# Patient Record
Sex: Female | Born: 1985 | Race: White | Hispanic: No | Marital: Married | Smoking: Never smoker
Health system: Southern US, Community
[De-identification: ages and names within clinical notes are randomized; demographics above are authoritative.]

## PROBLEM LIST (undated history)

## (undated) DIAGNOSIS — K219 Gastro-esophageal reflux disease without esophagitis: Secondary | ICD-10-CM

## (undated) DIAGNOSIS — G43909 Migraine, unspecified, not intractable, without status migrainosus: Secondary | ICD-10-CM

## (undated) DIAGNOSIS — K279 Peptic ulcer, site unspecified, unspecified as acute or chronic, without hemorrhage or perforation: Secondary | ICD-10-CM

## (undated) DIAGNOSIS — T79A21A Traumatic compartment syndrome of right lower extremity, initial encounter: Secondary | ICD-10-CM

## (undated) HISTORY — PX: KNEE SURGERY: SHX244

## (undated) HISTORY — DX: Migraine, unspecified, not intractable, without status migrainosus: G43.909

## (undated) HISTORY — DX: Traumatic compartment syndrome of right lower extremity, initial encounter: T79.A21A

## (undated) HISTORY — PX: FASCIECTOMY: SHX6525

---
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
## 2007-05-19 ENCOUNTER — Encounter: Payer: Self-pay | Admitting: Family Medicine

## 2007-05-19 ENCOUNTER — Ambulatory Visit (HOSPITAL_COMMUNITY): Admission: RE | Admit: 2007-05-19 | Discharge: 2007-05-19 | Payer: Self-pay | Admitting: Family Medicine

## 2007-05-19 ENCOUNTER — Ambulatory Visit: Payer: Self-pay | Admitting: *Deleted

## 2007-07-25 ENCOUNTER — Ambulatory Visit: Payer: Self-pay | Admitting: Vascular Surgery

## 2007-07-25 ENCOUNTER — Encounter: Payer: Self-pay | Admitting: Family Medicine

## 2007-07-25 ENCOUNTER — Ambulatory Visit (HOSPITAL_COMMUNITY): Admission: RE | Admit: 2007-07-25 | Discharge: 2007-07-25 | Payer: Self-pay | Admitting: Family Medicine

## 2007-08-18 ENCOUNTER — Emergency Department (HOSPITAL_COMMUNITY): Admission: EM | Admit: 2007-08-18 | Discharge: 2007-08-18 | Payer: Self-pay | Admitting: Emergency Medicine

## 2007-10-27 ENCOUNTER — Ambulatory Visit (HOSPITAL_COMMUNITY): Admission: RE | Admit: 2007-10-27 | Discharge: 2007-10-27 | Payer: Self-pay | Admitting: Neurology

## 2010-05-26 NOTE — Procedures (Signed)
EEG NUMBER:   HISTORY:  This is a 25 year old patient with episodes of loss of  consciousness and jerking.  The patient is being evaluated for possible  seizures.  This is a routine EEG.  No skull defects were noted.   This patient is on Zyrtec, Neurontin, and birth control pills.   EEG CLASSIFICATION:  Normal awake and sleep.   DESCRIPTION OF RECORDING:  Background rhythm of this recording consists  of a fairly well-modulated medium amplitude alpha rhythm of 10 Hz that  is reactive to eye opening and closure.  As the record progresses, the  patient initially is in the waking state.  Photic stimulation is  performed resulting in a bilateral and symmetric photic driving  response.  Hyperventilation was then performed resulting in a very  minimal buildup of the background rhythm activities without significant  slowing seen.  At no time during the recording did there appear to be  evidence of actual spikes, spike wave discharges, or evidence of focal  slowing.  The patient, however, enters the sleeping state very rapidly  with vertex sharp wave activity sleep spindles seen.  The patient spends  the majority at the end of the recording in the sleeping state.  Again,  no actual spikes or spike wave discharges were not seen.   EKG monitor shows no evidence of cardiac rhythm abnormalities with a  heart rate of 96.   IMPRESSION:  This is a normal EEG recording in the awake and sleeping  state.  No evidence of ictal or interictal discharges were seen.  The  patient appears to enter the sleeping state quite rapidly.  We would  consider a possible underlying sleep disorder.  Clinical correlation is  required.      Marlan Palau, M.D.  Electronically Signed     [id number]  D:  10/27/2007 18:47:37  T:  10/28/2007 03:19:52  Job #:  [id number]

## 2010-11-09 ENCOUNTER — Emergency Department: Payer: Self-pay | Admitting: Emergency Medicine

## 2010-12-26 ENCOUNTER — Encounter: Payer: Self-pay | Admitting: *Deleted

## 2010-12-26 ENCOUNTER — Emergency Department (HOSPITAL_COMMUNITY)
Admission: EM | Admit: 2010-12-26 | Discharge: 2010-12-26 | Disposition: A | Discharge status: Home / Self Care | Payer: BC Managed Care – PPO | Attending: Emergency Medicine | Admitting: Emergency Medicine

## 2010-12-26 DIAGNOSIS — B019 Varicella without complication: Secondary | ICD-10-CM | POA: Insufficient documentation

## 2010-12-26 DIAGNOSIS — R21 Rash and other nonspecific skin eruption: Secondary | ICD-10-CM | POA: Insufficient documentation

## 2010-12-26 NOTE — ED Provider Notes (Addendum)
History     CSN: [id number] Arrival date & time: 12/26/2010  6:28 PM   First MD Initiated Contact with Patient 12/26/10 1851      Chief Complaint  Patient presents with  . Rash    (Consider location/radiation/quality/duration/timing/severity/associated sxs/prior treatment) HPI Patient with rash that she noticed yesterday on her abdomen, right chest, and back.  Patient denies fever, nausea or vomiting.  Patient denies diarrhea, headache.  States the rash is itchy.  Denies any exposure to contact dermatitis plans.  History reviewed. No pertinent past medical history.  History reviewed. No pertinent past surgical history.  No family history on file.  History  Substance Use Topics  . Smoking status: Not on file  . Smokeless tobacco: Not on file  . Alcohol Use: Not on file    OB History    Grav Para Term Preterm Abortions TAB SAB Ect Mult Living                  Review of Systems  All other systems reviewed and are negative.    Allergies  Acyclovir and related; Advil; and Sulfa antibiotics  Home Medications   Current Outpatient Rx  Name Route Sig Dispense Refill  . CETIRIZINE HCL 10 MG PO TABS Oral Take 10 mg by mouth daily.      Marland Kitchen KETOPROFEN 50 MG PO CAPS Oral Take 50 mg by mouth 4 (four) times daily as needed. For migraine headaches     . SPRINTEC 28 PO Oral Take 1 tablet by mouth daily.      . TOPIRAMATE 25 MG PO TABS Oral Take 25 mg by mouth daily.        BP 110/74  Pulse 63  Temp(Src) 97.8 F (36.6 C) (Oral)  Resp 20  SpO2 100%  Physical Exam  Nursing note and vitals reviewed. Constitutional: She is oriented to person, place, and time. She appears well-developed and well-nourished. No distress.  HENT:  Head: Normocephalic and atraumatic.  Eyes: Pupils are equal, round, and reactive to light.  Neck: Normal range of motion.  Cardiovascular: Normal rate and intact distal pulses.   Pulmonary/Chest: No respiratory distress.  Abdominal: Normal  appearance. She exhibits no distension.  Musculoskeletal: Normal range of motion.  Neurological: She is alert and oriented to person, place, and time. No cranial nerve deficit.  Skin: Skin is warm and dry. Rash noted.       Rash on stomach chest and back area consisted of small vesicles of differing ages.  This rash is consistent with chickenpox  Psychiatric: She has a normal mood and affect. Her behavior is normal.    ED Course  Procedures (including critical care time)  Labs Reviewed - No data to display No results found.  Diagnosis: #1.  Chickenpox   MDM         Nelia Shi, MD 12/26/10 1854  Nelia Shi, MD 12/26/10 [phone]

## 2010-12-26 NOTE — ED Notes (Signed)
Patient with rash that she noticed yesterday on her abdomen, right chest, and back

## 2011-07-12 HISTORY — PX: ANKLE SURGERY: SHX546

## 2011-10-01 ENCOUNTER — Ambulatory Visit: Payer: Self-pay | Admitting: General Practice

## 2012-07-11 ENCOUNTER — Emergency Department: Payer: Self-pay | Admitting: Emergency Medicine

## 2012-07-14 ENCOUNTER — Emergency Department: Payer: Self-pay | Admitting: Emergency Medicine

## 2012-07-18 ENCOUNTER — Emergency Department: Payer: Self-pay | Admitting: Unknown Physician Specialty

## 2012-07-25 ENCOUNTER — Emergency Department: Payer: Self-pay | Admitting: Emergency Medicine

## 2012-09-15 ENCOUNTER — Encounter: Payer: Self-pay | Admitting: Family Medicine

## 2012-09-15 ENCOUNTER — Ambulatory Visit (INDEPENDENT_AMBULATORY_CARE_PROVIDER_SITE_OTHER): Payer: BC Managed Care – PPO | Admitting: Family Medicine

## 2012-09-15 VITALS — BP 124/84 | HR 68 | Temp 97.4°F | Ht 65.0 in | Wt 128.0 lb

## 2012-09-15 DIAGNOSIS — T799XXS Unspecified early complication of trauma, sequela: Secondary | ICD-10-CM

## 2012-09-15 DIAGNOSIS — T798XXS Other early complications of trauma, sequela: Secondary | ICD-10-CM

## 2012-09-15 NOTE — Progress Notes (Signed)
  Subjective:    Patient ID: Tonya Lee, female    DOB: April 29, 1985, 27 y.o.   MRN: [id number]  HPI  This 27 y.o. female presents for evaluation of wound from dog bite on her right leg Over 3 weeks ago.  She was bitten by a dog on the job and she was tx'd under [employer] comp and she states she had to get sutures and the wound became infected and she wasn't tx with abx's until yesterday.  She was released from [employer] and she is here to get a second opinion. She was tx'd with augmentin abx's and she has been taking this for the last day.  Review of Systems C/o right leg wound No chest pain, SOB, HA, dizziness, vision change, N/V, diarrhea, constipation, dysuria, urinary urgency or frequency, myalgias, arthralgias or rash.     Objective:   Physical Exam  Vital signs noted  Well developed well nourished female.  HEENT - Head atraumatic Normocephalic Respiratory - Lungs CTA bilateral Cardiac - RRR S1 and S2 without murmur Skin - Right lateral calf area with healed laceration approx 8cm long With area of dehiscence in middle with wound approx 1/2 cm in diameter. The wound is cleaned and enlarged and purulence is cleaned out with a  q tip and cx tip.  Wet to Dry dressing is applied.  ACE wrap is applied and dressing secured.      Assessment & Plan:  Post-traumatic wound infection, sequela - Plan: Aerobic culture Patient is currently on Augmentin and will cx wound and see if sensitive.  Apply wet to dry Dressing for next few days to week and then apply band aide.  Follow up prn

## 2012-09-15 NOTE — Patient Instructions (Signed)
Dressing Change A dressing is a material placed over wounds. It keeps the wound clean, dry, and protected from further injury. This provides an environment that favors wound healing.  BEFORE YOU BEGIN  Get your supplies together. Things you may need include:  Saline solution.  Flexible gauze dressing.  Medicated cream.  Tape.  Gloves.  Abdominal dressing pads.  Gauze squares.  Plastic bags.  Take pain medicine 30 minutes before the dressing change if you need it.  Take a shower before you do the first dressing change of the day. Use plastic wrap or a plastic bag to prevent the dressing from getting wet. REMOVING YOUR OLD DRESSING   Wash your hands with soap and water. Dry your hands with a clean towel.  Put on your gloves.  Remove any tape.  Carefully remove the old dressing. If the dressing sticks, you may dampen it with warm water to loosen it, or follow your caregiver's specific directions.  Remove any gauze or packing tape that is in your wound.  Take off your gloves.  Put the gloves, tape, gauze, or any packing tape into a plastic bag. CHANGING YOUR DRESSING  Open the supplies.  Take the cap off the saline solution.  Open the gauze package so that the gauze remains on the inside of the package.  Put on your gloves.  Clean your wound as told by your caregiver.  If you have been told to keep your wound dry, follow those instructions.  Your caregiver may tell you to do one or more of the following:  Pick up the gauze. Pour the saline solution over the gauze. Squeeze out the extra saline solution.  Put medicated cream or other medicine on your wound if you have been told to do so.  Put the solution soaked gauze only in your wound, not on the skin around it.  Pack your wound loosely or as told by your caregiver.  Put dry gauze on your wound.  Put abdominal dressing pads over the dry gauze if your wet gauze soaks through.  Tape the abdominal dressing  pads in place so they will not fall off. Do not wrap the tape completely around the affected part (arm, leg, abdomen).  Wrap the dressing pads with a flexible gauze dressing to secure it in place.  Take off your gloves. Put them in the plastic bag with the old dressing. Tie the bag shut and throw it away.  Keep the dressing clean and dry until your next dressing change.  Wash your hands. SEEK MEDICAL CARE IF:  Your skin around the wound looks red.  Your wound feels more tender or sore.  You see pus in the wound.  Your wound smells bad.  You have a fever.  Your skin around the wound has a rash that itches and burns.  You see black or yellow skin in your wound that was not there before.  You feel nauseous, throw up, and feel very tired. Document Released: 02/05/2004 Document Revised: 03/22/2011 Document Reviewed: 11/09/2010 ExitCare Patient Information 2014 [employer]. Wound Care Wound care helps prevent pain and infection.  You may need a tetanus shot if:  You cannot remember when you had your last tetanus shot.  You have never had a tetanus shot.  The injury broke your skin. If you need a tetanus shot and you choose not to have one, you may get tetanus. Sickness from tetanus can be serious. HOME CARE   Only take medicine as told by   your doctor.  Clean the wound daily with mild soap and water.  Change any bandages (dressings) as told by your doctor.  Put medicated cream and a bandage on the wound as told by your doctor.  Change the bandage if it gets wet, dirty, or starts to smell.  Take showers. Do not take baths, swim, or do anything that puts your wound under water.  Rest and raise (elevate) the wound until the pain and puffiness (swelling) are better.  Keep all doctor visits as told. GET HELP RIGHT AWAY IF:   Yellowish-white fluid (pus) comes from the wound.  Medicine does not lessen your pain.  There is a red streak going away from the  wound.  You have a fever. MAKE SURE YOU:   Understand these instructions.  Will watch your condition.  Will get help right away if you are not doing well or get worse. Document Released: 10/07/2007 Document Revised: 03/22/2011 Document Reviewed: 05/03/2010 ExitCare Patient Information 2014 [employer].  

## 2012-09-18 LAB — AEROBIC CULTURE

## 2012-09-26 ENCOUNTER — Telehealth: Payer: Self-pay | Admitting: *Deleted

## 2012-09-26 NOTE — Telephone Encounter (Signed)
Message copied by Bearl Mulberry on Tue Sep 26, 2012  6:13 PM ------      Message from: Deatra Canter      Created: Mon Sep 18, 2012 10:06 AM       Wound cx did not grow out any pathologic organism, continue current abx's. ------

## 2012-09-26 NOTE — Telephone Encounter (Signed)
Pt notified of results

## 2012-12-15 ENCOUNTER — Ambulatory Visit (INDEPENDENT_AMBULATORY_CARE_PROVIDER_SITE_OTHER): Payer: BC Managed Care – PPO | Admitting: Physician Assistant

## 2012-12-15 ENCOUNTER — Encounter: Payer: Self-pay | Admitting: Physician Assistant

## 2012-12-15 VITALS — BP 114/78 | HR 83 | Temp 98.0°F | Wt 129.2 lb

## 2012-12-15 DIAGNOSIS — K2970 Gastritis, unspecified, without bleeding: Secondary | ICD-10-CM

## 2012-12-15 NOTE — Progress Notes (Signed)
   Subjective:    Patient ID: Tonya Lee, female    DOB: 03-Aug-1985, 27 y.o.   MRN: [id number]  HPI 27 y/o female presents with nausea, headache and abdominal pain since yesterday at 6pm. Has taken tylenol 30 min prior to appt for relief. Recent sick contact with similar symptoms.     Review of Systems  Constitutional: Positive for fever (low grade), activity change, appetite change (decreased) and fatigue. Negative for chills, diaphoresis and unexpected weight change.  Eyes: Negative.   Respiratory: Negative.   Cardiovascular: Negative.   Gastrointestinal: Positive for nausea and abdominal pain (periumbilical and RLQ). Negative for vomiting, diarrhea, constipation and anal bleeding.  Musculoskeletal: Positive for myalgias (overall body aches).  Neurological: Positive for headaches. Negative for light-headedness.       Objective:   Physical Exam  Constitutional: She is oriented to person, place, and time. She appears well-developed and well-nourished. No distress.  HENT:  Head: Atraumatic.  Cardiovascular: Normal rate.   Pulmonary/Chest: Effort normal and breath sounds normal. No respiratory distress. She has no wheezes. She has no rales. She exhibits no tenderness.  Abdominal: Soft. She exhibits no distension. There is tenderness (TTP in RLQ and periumbilical. Negative Rovsing, negative psoas sign & negative obturator). There is tenderness at McBurney's point. There is no rebound, no guarding and no CVA tenderness.  Neurological: She is alert and oriented to person, place, and time.  Skin: She is not diaphoretic.          Assessment & Plan:  1. Viral gastritis: Due to generalized symptoms and history of a contact with similar symptoms, I feel that this is a viral illness and will resolve. However, due to patients TTP at Mcburney's point, nausea and history of a fever, I advised her to go to the ER if s/s worsen or do not improve in a couple of days to r/o appendicitis. She  does not appear to be in pain of this severity at today's visit. Advised to drink plenty of fluids, bland diet.

## 2012-12-15 NOTE — Patient Instructions (Signed)
Drink plenty of fluids. Bananas, rice, applesauce, toast --- bland foods to diet in the  Beginning. If s/s worsen ( fever, severe naval or right lower stomach pain, vomiting) go to the ER for further evaluation.

## 2013-04-21 ENCOUNTER — Encounter: Payer: Self-pay | Admitting: Nurse Practitioner

## 2013-04-21 ENCOUNTER — Ambulatory Visit (INDEPENDENT_AMBULATORY_CARE_PROVIDER_SITE_OTHER): Payer: BC Managed Care – PPO | Admitting: Nurse Practitioner

## 2013-04-21 VITALS — BP 110/75 | HR 69 | Temp 97.9°F | Wt 133.2 lb

## 2013-04-21 DIAGNOSIS — B070 Plantar wart: Secondary | ICD-10-CM

## 2013-04-21 NOTE — Progress Notes (Signed)
   Subjective:    Patient ID: Tonya Lee, female    DOB: December 23, 1985, 28 y.o.   MRN: [id number]  HPI Patient in c/o lesion that has come up on right thigh- SHe tried OTC wart stuff which did not help.    Review of Systems  All other systems reviewed and are negative.      Objective:   Physical Exam  Constitutional: She appears well-developed and well-nourished.  Cardiovascular: Normal rate, regular rhythm and normal heart sounds.   Pulmonary/Chest: Effort normal and breath sounds normal.  Neurological: She is alert.  Skin:  1.5 cm flesh colored lesion right thigh    BP 110/75  Pulse 69  Temp(Src) 97.9 F (36.6 C) (Oral)  Wt 133 lb 3.2 oz (60.419 kg)       Assessment & Plan:   1. Verruca pedis    Cryotherapy-care discussed Watch for signs of infection  Mary-Margaret Daphine DeutscherMartin, FNP

## 2013-09-07 ENCOUNTER — Ambulatory Visit: Payer: Self-pay | Admitting: Family Medicine

## 2013-11-19 ENCOUNTER — Ambulatory Visit: Payer: BC Managed Care – PPO | Admitting: Family Medicine

## 2013-11-19 ENCOUNTER — Ambulatory Visit (INDEPENDENT_AMBULATORY_CARE_PROVIDER_SITE_OTHER): Payer: BC Managed Care – PPO | Admitting: Family Medicine

## 2013-11-19 ENCOUNTER — Ambulatory Visit: Payer: BC Managed Care – PPO | Admitting: Nurse Practitioner

## 2013-11-19 ENCOUNTER — Encounter: Payer: Self-pay | Admitting: Family Medicine

## 2013-11-19 VITALS — BP 121/72 | HR 76 | Temp 98.1°F | Ht 65.0 in | Wt 139.2 lb

## 2013-11-19 DIAGNOSIS — R21 Rash and other nonspecific skin eruption: Secondary | ICD-10-CM

## 2013-11-19 MED ORDER — METHYLPREDNISOLONE (PAK) 4 MG PO TABS: ORAL_TABLET | ORAL | Status: DC

## 2013-11-19 MED ORDER — METHYLPREDNISOLONE ACETATE 80 MG/ML IJ SUSP
80.0000 mg | Freq: Once | INTRAMUSCULAR | Status: AC
  Administered 2013-11-19: 80 mg via INTRAMUSCULAR

## 2013-11-19 NOTE — Progress Notes (Signed)
   Subjective:    Patient ID: Tonya Lee, female    DOB: 08-Sep-1985, 28 y.o.   MRN: [id number]  HPI Patient is here for c/o rash and pruritis.  Review of Systems  Constitutional: Negative for fever.  HENT: Negative for ear pain.   Eyes: Negative for discharge.  Respiratory: Negative for cough.   Cardiovascular: Negative for chest pain.  Gastrointestinal: Negative for abdominal distention.  Endocrine: Negative for polyuria.  Genitourinary: Negative for difficulty urinating.  Musculoskeletal: Negative for gait problem and neck pain.  Skin: Negative for color change and rash.  Neurological: Negative for speech difficulty and headaches.  Psychiatric/Behavioral: Negative for agitation.       Objective:    BP 121/72 mmHg  Pulse 76  Temp(Src) 98.1 F (36.7 C) (Oral)  Ht 5\' 5"  (1.651 m)  Wt 139 lb 3.2 oz (63.141 kg)  BMI 23.16 kg/m2  LMP 10/22/2013   Physical Exam  Constitutional: She is oriented to person, place, and time. She appears well-developed and well-nourished.  HENT:  Head: Normocephalic and atraumatic.  Mouth/Throat: Oropharynx is clear and moist.  Eyes: Pupils are equal, round, and reactive to light.  Neck: Normal range of motion. Neck supple.  Cardiovascular: Normal rate and regular rhythm.   No murmur heard. Pulmonary/Chest: Effort normal and breath sounds normal.  Abdominal: Soft. Bowel sounds are normal. There is no tenderness.  Neurological: She is alert and oriented to person, place, and time.  Skin: Skin is warm and dry.  Psychiatric: She has a normal mood and affect.          Assessment & Plan:     ICD-9-CM ICD-10-CM   1. Rash and nonspecific skin eruption 782.1 R21 methylPREDNIsolone (MEDROL DOSPACK) 4 MG tablet     methylPREDNISolone acetate (DEPO-MEDROL) injection 80 mg   Take benadryl otc  No Follow-up on file.  Deatra CanterWilliam J Lachae Hohler FNP

## 2013-12-24 ENCOUNTER — Encounter: Payer: Self-pay | Admitting: Family Medicine

## 2013-12-24 ENCOUNTER — Telehealth: Payer: Self-pay | Admitting: Family Medicine

## 2013-12-24 ENCOUNTER — Ambulatory Visit (INDEPENDENT_AMBULATORY_CARE_PROVIDER_SITE_OTHER): Payer: BC Managed Care – PPO | Admitting: Family Medicine

## 2013-12-24 VITALS — BP 108/77 | HR 69 | Temp 97.1°F | Ht 65.0 in | Wt 130.0 lb

## 2013-12-24 DIAGNOSIS — J206 Acute bronchitis due to rhinovirus: Secondary | ICD-10-CM

## 2013-12-24 MED ORDER — METHYLPREDNISOLONE ACETATE 80 MG/ML IJ SUSP
80.0000 mg | Freq: Once | INTRAMUSCULAR | Status: AC
  Administered 2013-12-24: 80 mg via INTRAMUSCULAR

## 2013-12-24 MED ORDER — BENZONATATE 100 MG PO CAPS: 100.0000 mg | ORAL_CAPSULE | Freq: Three times a day (TID) | ORAL | Status: DC | PRN

## 2013-12-24 MED ORDER — FLUCONAZOLE 150 MG PO TABS: 150.0000 mg | ORAL_TABLET | Freq: Once | ORAL | Status: DC

## 2013-12-24 MED ORDER — AMOXICILLIN 875 MG PO TABS: 875.0000 mg | ORAL_TABLET | Freq: Two times a day (BID) | ORAL | Status: DC

## 2013-12-24 NOTE — Progress Notes (Signed)
   Subjective:    Patient ID: Tonya Lee, female    DOB: 08-29-1985, 28 y.o.   MRN: [id number]  HPI Patient is here with c/o uri sx's  Review of Systems  Constitutional: Negative for fever.  HENT: Negative for ear pain.   Eyes: Negative for discharge.  Respiratory: Negative for cough.   Cardiovascular: Negative for chest pain.  Gastrointestinal: Negative for abdominal distention.  Endocrine: Negative for polyuria.  Genitourinary: Negative for difficulty urinating.  Musculoskeletal: Negative for gait problem and neck pain.  Skin: Negative for color change and rash.  Neurological: Negative for speech difficulty and headaches.  Psychiatric/Behavioral: Negative for agitation.       Objective:    BP 108/77 mmHg  Pulse 69  Temp(Src) 97.1 F (36.2 C) (Oral)  Ht 5\' 5"  (1.651 m)  Wt 130 lb (58.968 kg)  BMI 21.63 kg/m2  LMP 12/20/2013 Physical Exam  Constitutional: She is oriented to person, place, and time. She appears well-developed and well-nourished.  HENT:  Head: Normocephalic and atraumatic.  Mouth/Throat: Oropharynx is clear and moist.  Eyes: Pupils are equal, round, and reactive to light.  Neck: Normal range of motion. Neck supple.  Cardiovascular: Normal rate and regular rhythm.   No murmur heard. Pulmonary/Chest: Effort normal and breath sounds normal.  Abdominal: Soft. Bowel sounds are normal. There is no tenderness.  Neurological: She is alert and oriented to person, place, and time.  Skin: Skin is warm and dry.  Psychiatric: She has a normal mood and affect.          Assessment & Plan:     ICD-9-CM ICD-10-CM   1. Acute bronchitis due to Rhinovirus 466.0 J20.6 methylPREDNISolone acetate (DEPO-MEDROL) injection 80 mg   079.3  benzonatate (TESSALON PERLES) 100 MG capsule     amoxicillin (AMOXIL) 875 MG tablet     fluconazole (DIFLUCAN) 150 MG tablet     DISCONTINUED: amoxicillin (AMOXIL) 875 MG tablet     DISCONTINUED: fluconazole (DIFLUCAN) 150 MG  tablet     No Follow-up on file.  Deatra CanterWilliam J Oxford FNP

## 2013-12-24 NOTE — Telephone Encounter (Signed)
Pt given appt today with Bill @ 5:45.

## 2014-01-01 ENCOUNTER — Ambulatory Visit (INDEPENDENT_AMBULATORY_CARE_PROVIDER_SITE_OTHER): Payer: BC Managed Care – PPO | Admitting: Family Medicine

## 2014-01-01 ENCOUNTER — Encounter: Payer: Self-pay | Admitting: Family Medicine

## 2014-01-01 DIAGNOSIS — J0100 Acute maxillary sinusitis, unspecified: Secondary | ICD-10-CM

## 2014-01-01 MED ORDER — HYDROCODONE-HOMATROPINE 5-1.5 MG/5ML PO SYRP: 5.0000 mL | ORAL_SOLUTION | Freq: Three times a day (TID) | ORAL | Status: DC | PRN

## 2014-01-01 MED ORDER — AMOXICILLIN-POT CLAVULANATE 875-125 MG PO TABS: 1.0000 | ORAL_TABLET | Freq: Two times a day (BID) | ORAL | Status: DC

## 2014-01-01 NOTE — Progress Notes (Signed)
   Subjective:    Patient ID: Tonya Lee, female    DOB: 09-30-1985, 28 y.o.   MRN: [id number]  HPI 28 year old with cough congestion drainage bilaterally or pain. The cough is productive of green sputum. She was seen here last week and started on amoxicillin and given an injection and thought she was feeling better. In retrospect I suspect she felt better from the steroid but now that that's worn off she's feeling worse.    Review of Systems  HENT: Positive for congestion and sinus pressure.   Respiratory: Positive for cough.   Cardiovascular: Negative.   Gastrointestinal: Negative.        Objective:   Physical Exam  Constitutional: She appears well-developed and well-nourished.  HENT:  Right Ear: External ear normal.  Left Ear: External ear normal.  Mouth/Throat: Oropharynx is clear and moist.  Neck:  Even though there is no nodal enlargement there is tenderness in anterior cervical node chain  Pulmonary/Chest: Effort normal and breath sounds normal.  Lymphadenopathy:    She has no cervical adenopathy.   LMP 12/20/2013       Assessment & Plan:  1. Acute maxillary sinusitis, recurrence not specified Since she is worse after almost full course of amoxicillin will change to Augmentin and add H  Tonya KusterStephen M Reis Goga MDycodan as cough suppressant to take mostly at bedtime or when necessary daytime use

## 2014-04-03 ENCOUNTER — Ambulatory Visit (INDEPENDENT_AMBULATORY_CARE_PROVIDER_SITE_OTHER): Payer: BC Managed Care – PPO | Admitting: Physician Assistant

## 2014-04-03 ENCOUNTER — Encounter: Payer: Self-pay | Admitting: Physician Assistant

## 2014-04-03 VITALS — BP 110/75 | HR 75 | Temp 99.0°F | Ht 65.0 in | Wt 133.6 lb

## 2014-04-03 DIAGNOSIS — J3089 Other allergic rhinitis: Secondary | ICD-10-CM | POA: Diagnosis not present

## 2014-04-03 DIAGNOSIS — H65113 Acute and subacute allergic otitis media (mucoid) (sanguinous) (serous), bilateral: Secondary | ICD-10-CM | POA: Diagnosis not present

## 2014-04-03 DIAGNOSIS — R52 Pain, unspecified: Secondary | ICD-10-CM

## 2014-04-03 LAB — POCT INFLUENZA A/B
Influenza A, POC: NEGATIVE
Influenza B, POC: NEGATIVE

## 2014-04-03 LAB — POCT RAPID STREP A (OFFICE): RAPID STREP A SCREEN: NEGATIVE

## 2014-04-03 MED ORDER — CETIRIZINE HCL 10 MG PO TABS: 10.0000 mg | ORAL_TABLET | Freq: Every day | ORAL | Status: DC

## 2014-04-03 MED ORDER — AMOXICILLIN 875 MG PO TABS
875.0000 mg | ORAL_TABLET | Freq: Two times a day (BID) | ORAL | Status: DC
Start: 2014-04-03 — End: 2014-04-13

## 2014-04-03 MED ORDER — FLUTICASONE PROPIONATE 50 MCG/ACT NA SUSP: 2.0000 | Freq: Every day | NASAL | Status: DC

## 2014-04-03 NOTE — Progress Notes (Signed)
   Subjective:    Patient ID: Tonya Lee, female    DOB: 10-28-1985, [age] y.o.   MRN: [id number]  HPI 29 y/o presents with sneezing, runny nose, nasal congestion since yesterday. Has tried Tylenol cold/flu and flonase with no relief. Associated sick contacts.    Review of Systems  Constitutional: Positive for fever (low grade), chills, diaphoresis and fatigue.  HENT: Positive for congestion, ear pain, postnasal drip, rhinorrhea, sinus pressure, sneezing and sore throat.   Respiratory: Positive for shortness of breath (with activity ). Negative for cough and wheezing.   Cardiovascular: Negative.   Gastrointestinal: Negative.   Genitourinary: Positive for genital sores.       Objective:   Physical Exam  Constitutional: She appears well-developed and well-nourished. No distress.  HENT:  Right Ear: External ear normal. Tympanic membrane is injected and erythematous.  Left Ear: Tympanic membrane is injected.  Mouth/Throat: No oropharyngeal exudate.  Eyes: Right eye exhibits no discharge. Left eye exhibits no discharge.  Cardiovascular: Normal rate, regular rhythm and normal heart sounds.  Exam reveals no gallop and no friction rub.   No murmur heard. Pulmonary/Chest: Effort normal and breath sounds normal. No respiratory distress. She has no wheezes. She has no rales.  Skin: She is not diaphoretic.  Nursing note and vitals reviewed.         Assessment & Plan:  1. Bilateral otitis media: Amoxicillin 875mg  1 pill BID x 10 days with food 2. Allergic Rhinitis: Zyrtec 10mg  q day, Flonase nasal spray , 2 sprays each nostril once daily   F/U if s/s worsen or do not improve.

## 2014-04-11 ENCOUNTER — Telehealth: Payer: Self-pay | Admitting: Physician Assistant

## 2014-04-11 NOTE — Telephone Encounter (Signed)
Right ear pain has resolved. Left ear pain started to improve but she woke up with severe pain yesterday morning that has persisted through today. She now has some nausea and dizziness as well. She has used OTC ear drops and Advil for pain. She is taking her antibiotics as prescribed.  Suggested warm compress to ear and to call back if symptoms worsened. Advised that I would pass this message on to Lynwood Dawleyiffany Gann, PA-C for her recommendations.

## 2014-04-11 NOTE — Telephone Encounter (Signed)
Good advice.  Thanks.

## 2014-04-12 NOTE — Telephone Encounter (Signed)
Pt will continue with OTC ear drops & Advil today, if not any better tomorrow may come in to be seen since she is going out of town on Monday and would rather not have to go to an Urgent Care next week while out of town.

## 2014-04-13 ENCOUNTER — Ambulatory Visit (INDEPENDENT_AMBULATORY_CARE_PROVIDER_SITE_OTHER): Payer: BC Managed Care – PPO | Admitting: Family

## 2014-04-13 VITALS — BP 107/75 | HR 63 | Temp 98.3°F | Ht 65.0 in | Wt 131.0 lb

## 2014-04-13 DIAGNOSIS — H6692 Otitis media, unspecified, left ear: Secondary | ICD-10-CM | POA: Diagnosis not present

## 2014-04-13 DIAGNOSIS — J0100 Acute maxillary sinusitis, unspecified: Secondary | ICD-10-CM

## 2014-04-13 MED ORDER — LEVOFLOXACIN 500 MG PO TABS: 500.0000 mg | ORAL_TABLET | Freq: Every day | ORAL | Status: DC

## 2014-04-13 NOTE — Patient Instructions (Addendum)
Otitis Media °Otitis media is redness, soreness, and inflammation of the middle ear. Otitis media may be caused by allergies or, most commonly, by infection. Often it occurs as a complication of the common cold. °SIGNS AND SYMPTOMS °Symptoms of otitis media may include: °· Earache. °· Fever. °· Ringing in your ear. °· Headache. °· Leakage of fluid from the ear. °DIAGNOSIS °To diagnose otitis media, your health care provider will examine your ear with an otoscope. This is an instrument that allows your health care provider to see into your ear in order to examine your eardrum. Your health care provider also will ask you questions about your symptoms. °TREATMENT  °Typically, otitis media resolves on its own within 3-5 days. Your health care provider may prescribe medicine to ease your symptoms of pain. If otitis media does not resolve within 5 days or is recurrent, your health care provider may prescribe antibiotic medicines if he or she suspects that a bacterial infection is the cause. °HOME CARE INSTRUCTIONS  °· If you were prescribed an antibiotic medicine, finish it all even if you start to feel better. °· Take medicines only as directed by your health care provider. °· Keep all follow-up visits as directed by your health care provider. °SEEK MEDICAL CARE IF: °· You have otitis media only in one ear, or bleeding from your nose, or both. °· You notice a lump on your neck. °· You are not getting better in 3-5 days. °· You feel worse instead of better. °SEEK IMMEDIATE MEDICAL CARE IF:  °· You have pain that is not controlled with medicine. °· You have swelling, redness, or pain around your ear or stiffness in your neck. °· You notice that part of your face is paralyzed. °· You notice that the bone behind your ear (mastoid) is tender when you touch it. °MAKE SURE YOU:  °· Understand these instructions. °· Will watch your condition. °· Will get help right away if you are not doing well or get worse. °Document Released:  10/03/2003 Document Revised: 05/14/2013 Document Reviewed: 07/25/2012 °ExitCare® Patient Information ©2015 [employer]. This information is not intended to replace advice given to you by your health care provider. Make sure you discuss any questions you have with your health care provider. ° °Sinusitis °Sinusitis is redness, soreness, and inflammation of the paranasal sinuses. Paranasal sinuses are air pockets within the bones of your face (beneath the eyes, the middle of the forehead, or above the eyes). In healthy paranasal sinuses, mucus is able to drain out, and air is able to circulate through them by way of your nose. However, when your paranasal sinuses are inflamed, mucus and air can become trapped. This can allow bacteria and other germs to grow and cause infection. °Sinusitis can develop quickly and last only a short time (acute) or continue over a long period (chronic). Sinusitis that lasts for more than 12 weeks is considered chronic.  °CAUSES  °Causes of sinusitis include: °· Allergies. °· Structural abnormalities, such as displacement of the cartilage that separates your nostrils (deviated septum), which can decrease the air flow through your nose and sinuses and affect sinus drainage. °· Functional abnormalities, such as when the small hairs (cilia) that line your sinuses and help remove mucus do not work properly or are not present. °SIGNS AND SYMPTOMS  °Symptoms of acute and chronic sinusitis are the same. The primary symptoms are pain and pressure around the affected sinuses. Other symptoms include: °· Upper toothache. °· Earache. °· Headache. °· Bad   Decreased sense of smell and taste.  A cough, which worsens when you are lying flat.  Fatigue.  Fever.  Thick drainage from your nose, which often is green and may contain pus (purulent).  Swelling and warmth over the affected sinuses. DIAGNOSIS  Your health care provider will perform a physical exam. During the exam, your health  care provider may:  Look in your nose for signs of abnormal growths in your nostrils (nasal polyps).  Tap over the affected sinus to check for signs of infection.  View the inside of your sinuses (endoscopy) using an imaging device that has a light attached (endoscope). If your health care provider suspects that you have chronic sinusitis, one or more of the following tests may be recommended:  Allergy tests.  Nasal culture. A sample of mucus is taken from your nose, sent to a lab, and screened for bacteria.  Nasal cytology. A sample of mucus is taken from your nose and examined by your health care provider to determine if your sinusitis is related to an allergy. TREATMENT  Most cases of acute sinusitis are related to a viral infection and will resolve on their own within 10 days. Sometimes medicines are prescribed to help relieve symptoms (pain medicine, decongestants, nasal steroid sprays, or saline sprays).  However, for sinusitis related to a bacterial infection, your health care provider will prescribe antibiotic medicines. These are medicines that will help kill the bacteria causing the infection.  Rarely, sinusitis is caused by a fungal infection. In theses cases, your health care provider will prescribe antifungal medicine. For some cases of chronic sinusitis, surgery is needed. Generally, these are cases in which sinusitis recurs more than 3 times per year, despite other treatments. HOME CARE INSTRUCTIONS   Drink plenty of water. Water helps thin the mucus so your sinuses can drain more easily.  Use a humidifier.  Inhale steam 3 to 4 times a day (for example, sit in the bathroom with the shower running).  Apply a warm, moist washcloth to your face 3 to 4 times a day, or as directed by your health care provider.  Use saline nasal sprays to help moisten and clean your sinuses.  Take medicines only as directed by your health care provider.  If you were prescribed either an  antibiotic or antifungal medicine, finish it all even if you start to feel better. SEEK IMMEDIATE MEDICAL CARE IF:  You have increasing pain or severe headaches.  You have nausea, vomiting, or drowsiness.  You have swelling around your face.  You have vision problems.  You have a stiff neck.  You have difficulty breathing. MAKE SURE YOU:   Understand these instructions.  Will watch your condition.  Will get help right away if you are not doing well or get worse. Document Released: 12/28/2004 Document Revised: 05/14/2013 Document Reviewed: 01/12/2011 [facility] Patient Information 2015 [place], [place]. This information is not intended to replace advice given to you by your health care provider. Make sure you discuss any questions you have with your health care provider.  - Take meds as prescribed - Use a cool mist humidifier  -Use saline nose sprays frequently -Saline irrigations of the nose can be very helpful if done frequently.  * 4X daily for 1 week*  * Use of a nettie pot can be helpful with this. Follow directions with this* -Force fluids -For any cough or congestion  Use plain Mucinex- regular strength or max strength is fine   * Children- consult with Pharmacist  for dosing -For fever or aces or pains- take tylenol or ibuprofen appropriate for age and weight.  * for fevers greater than 101 orally you may alternate ibuprofen and tylenol every  3 hours. -Throat lozenges if help   Evelina Dun, FNP

## 2014-04-13 NOTE — Progress Notes (Signed)
Subjective:    Patient ID: Tonya Lee, female    DOB: 08/31/1985, 29 y.o.   MRN: [id number]  Otalgia  There is pain in the left ear. This is a recurrent problem. The current episode started 1 to 4 weeks ago. The problem occurs every few minutes. The problem has been gradually worsening. There has been no fever. The pain is at a severity of 8/10. The pain is mild. Associated symptoms include ear discharge, hearing loss, a sore throat and vomiting. Pertinent negatives include no headaches. She has tried antibiotics and ear drops for the symptoms. The treatment provided mild relief. Her past medical history is significant for a chronic ear infection.      Review of Systems  Constitutional: Negative.   HENT: Positive for ear discharge, ear pain, hearing loss and sore throat.   Eyes: Negative.   Respiratory: Negative.  Negative for shortness of breath.   Cardiovascular: Negative.  Negative for palpitations.  Gastrointestinal: Positive for vomiting.  Endocrine: Negative.   Genitourinary: Negative.   Musculoskeletal: Negative.   Neurological: Negative.  Negative for headaches.  Hematological: Negative.   Psychiatric/Behavioral: Negative.   All other systems reviewed and are negative.      Objective:   Physical Exam  Constitutional: She is oriented to person, place, and time. She appears well-developed and well-nourished. No distress.  HENT:  Head: Normocephalic and atraumatic.  Right Ear: External ear normal.  Left Ear: There is swelling and tenderness. A middle ear effusion is present.  Nose: Right sinus exhibits maxillary sinus tenderness. Left sinus exhibits maxillary sinus tenderness.  Nasal passage erythemas with mild swelling  Oropharynx erythemas   Eyes: Pupils are equal, round, and reactive to light.  Neck: Normal range of motion. Neck supple. No thyromegaly present.  Cardiovascular: Normal rate, regular rhythm, normal heart sounds and intact distal pulses.   No  murmur heard. Pulmonary/Chest: Effort normal and breath sounds normal. No respiratory distress. She has no wheezes.  Abdominal: Soft. Bowel sounds are normal. She exhibits no distension. There is no tenderness.  Musculoskeletal: Normal range of motion. She exhibits no edema or tenderness.  Neurological: She is alert and oriented to person, place, and time. She has normal reflexes. No cranial nerve deficit.  Skin: Skin is warm and dry.  Psychiatric: She has a normal mood and affect. Her behavior is normal. Judgment and thought content normal.  Vitals reviewed.     BP 107/75 mmHg  Pulse 63  Temp(Src) 98.3 F (36.8 C) (Oral)  Ht  (1.651 m)  Wt 131 lb (59.421 kg)  BMI 21.80 kg/m2  LMP 03/06/2014     Assessment & Plan:  1. Acute maxillary sinusitis, recurrence not specified -- Take meds as prescribed - Use a cool mist humidifier  -Use saline nose sprays frequently -Saline irrigations of the nose can be very helpful if done frequently.  * 4X daily for 1 week*  * Use of a nettie pot can be helpful with this. Follow directions with this* -Force fluids -For any cough or congestion  Use plain Mucinex- regular strength or max strength is fine   * Children- consult with Pharmacist for dosing -For fever or aces or pains- take tylenol or ibuprofen appropriate for age and weight.  * for fevers greater than 101 orally you may alternate ibuprofen and tylenol every  3 hours. -Throat lozenges if help - levofloxacin (LEVAQUIN) 500 MG tablet; Take 1 tablet (500 mg total) by mouth daily.  Dispense: 7 tablet; Refill: 0  2. Recurrent otitis media of left ear, unspecified chronicity, unspecified otitis media type Take meds as prescribed RTO prn  Jannifer Rodneyhristy Seena Ritacco, FNP

## 2014-05-23 ENCOUNTER — Encounter (INDEPENDENT_AMBULATORY_CARE_PROVIDER_SITE_OTHER): Payer: Self-pay

## 2014-05-23 ENCOUNTER — Encounter: Payer: Self-pay | Admitting: Nurse Practitioner

## 2014-05-23 ENCOUNTER — Ambulatory Visit (INDEPENDENT_AMBULATORY_CARE_PROVIDER_SITE_OTHER): Payer: BC Managed Care – PPO | Admitting: Nurse Practitioner

## 2014-05-23 VITALS — BP 110/71 | HR 71 | Temp 98.0°F | Ht 65.0 in | Wt 130.2 lb

## 2014-05-23 DIAGNOSIS — N3001 Acute cystitis with hematuria: Secondary | ICD-10-CM

## 2014-05-23 DIAGNOSIS — R300 Dysuria: Secondary | ICD-10-CM

## 2014-05-23 LAB — POCT URINALYSIS DIPSTICK
Bilirubin, UA: NEGATIVE
Glucose, UA: NEGATIVE
NITRITE UA: NEGATIVE
Spec Grav, UA: 1.02
Urobilinogen, UA: NEGATIVE
pH, UA: 6

## 2014-05-23 LAB — POCT UA - MICROSCOPIC ONLY
CRYSTALS, UR, HPF, POC: NEGATIVE
Casts, Ur, LPF, POC: NEGATIVE
Mucus, UA: NEGATIVE
Yeast, UA: NEGATIVE

## 2014-05-23 MED ORDER — NITROFURANTOIN MONOHYD MACRO 100 MG PO CAPS
100.0000 mg | ORAL_CAPSULE | Freq: Two times a day (BID) | ORAL | Status: DC
Start: 2014-05-23 — End: 2014-07-05

## 2014-05-23 MED ORDER — PHENAZOPYRIDINE HCL 100 MG PO TABS: 100.0000 mg | ORAL_TABLET | Freq: Three times a day (TID) | ORAL | Status: DC | PRN

## 2014-05-23 NOTE — Progress Notes (Signed)
  Subjective:    Tonya Lee is a 29 y.o. female who complains of abnormal smelling urine, burning with urination, dysuria, frequency, nocturia and urgency. She has had symptoms for 4 days. Patient also complains of back pain, fever and stomach ache. Patient denies vaginal discharge. Patient does not have a history of recurrent UTI. Patient does not have a history of pyelonephritis.   The following portions of the patient's history were reviewed and updated as appropriate: allergies, current medications, past family history, past medical history, past social history, past surgical history and problem list.  Review of Systems Pertinent items are noted in HPI.    Objective:    BP 110/71 mmHg  Pulse 71  Temp(Src) 98 F (36.7 C) (Oral)  Ht 5\' 5"  (1.651 m)  Wt 130 lb 3.2 oz (59.058 kg)  BMI 21.67 kg/m2  LMP 05/14/2014 General appearance: alert and cooperative Lungs: clear to auscultation bilaterally Heart: regular rate and rhythm, S1, S2 normal, no murmur, click, rub or gallop Abdomen: suprapubic pain on palpation. No CVA tenderness  Laboratory:    Assessment:    Acute cystitis     Plan:   Meds ordered this encounter  Medications  . phenazopyridine (PYRIDIUM) 100 MG tablet    Sig: Take 1 tablet (100 mg total) by mouth 3 (three) times daily as needed for pain.    Dispense:  6 tablet    Refill:  0    Order Specific Question:  Supervising Provider    Answer:  Ernestina PennaMOORE, DONALD W [[id number]]  . nitrofurantoin, macrocrystal-monohydrate, (MACROBID) 100 MG capsule    Sig: Take 1 capsule (100 mg total) by mouth 2 (two) times daily. 1 po BId    Dispense:  14 capsule    Refill:  0    Order Specific Question:  Supervising Provider    Answer:  Ernestina PennaMOORE, DONALD W [[id number]]   Take medication as prescribe Cotton underwear Take shower not bath Cranberry juice, yogurt Force fluids AZO over the counter X2 days Culture pending RTO prn  Mary-Margaret Daphine DeutscherMartin, FNP

## 2014-05-23 NOTE — Patient Instructions (Signed)
Take medication as prescribe Cotton underwear Take shower not bath Cranberry juice, yogurt Force fluids AZO over the counter X2 days Culture pending RTO prn  

## 2014-05-23 NOTE — Addendum Note (Signed)
Addended by: Prescott GumLAND, Calie Buttrey M on: 05/23/2014 08:38 AM   Modules accepted: Orders

## 2014-05-27 LAB — URINE CULTURE

## 2014-07-04 ENCOUNTER — Telehealth: Payer: Self-pay | Admitting: Family Medicine

## 2014-07-05 ENCOUNTER — Encounter: Payer: Self-pay | Admitting: *Deleted

## 2014-07-05 ENCOUNTER — Encounter (INDEPENDENT_AMBULATORY_CARE_PROVIDER_SITE_OTHER): Payer: Self-pay

## 2014-07-05 ENCOUNTER — Ambulatory Visit (HOSPITAL_COMMUNITY)
Admission: RE | Admit: 2014-07-05 | Discharge: 2014-07-05 | Disposition: A | Discharge status: Home / Self Care | Payer: BC Managed Care – PPO | Source: Ambulatory Visit | Attending: Family Medicine | Admitting: Family Medicine

## 2014-07-05 ENCOUNTER — Ambulatory Visit (INDEPENDENT_AMBULATORY_CARE_PROVIDER_SITE_OTHER): Payer: BC Managed Care – PPO | Admitting: Family Medicine

## 2014-07-05 ENCOUNTER — Encounter: Payer: Self-pay | Admitting: Family Medicine

## 2014-07-05 VITALS — BP 106/73 | HR 62 | Temp 98.4°F | Ht 65.0 in | Wt 130.0 lb

## 2014-07-05 DIAGNOSIS — X58XXXA Exposure to other specified factors, initial encounter: Secondary | ICD-10-CM | POA: Insufficient documentation

## 2014-07-05 DIAGNOSIS — M79604 Pain in right leg: Secondary | ICD-10-CM | POA: Insufficient documentation

## 2014-07-05 DIAGNOSIS — Y939 Activity, unspecified: Secondary | ICD-10-CM | POA: Insufficient documentation

## 2014-07-05 DIAGNOSIS — S7011XA Contusion of right thigh, initial encounter: Secondary | ICD-10-CM | POA: Insufficient documentation

## 2014-07-05 DIAGNOSIS — T148 Other injury of unspecified body region: Secondary | ICD-10-CM | POA: Diagnosis not present

## 2014-07-05 DIAGNOSIS — M79661 Pain in right lower leg: Secondary | ICD-10-CM

## 2014-07-05 DIAGNOSIS — T148XXA Other injury of unspecified body region, initial encounter: Secondary | ICD-10-CM

## 2014-07-05 NOTE — Progress Notes (Signed)
   Subjective:    Patient ID: Tonya Lee, female    DOB: 07-Feb-1985, 29 y.o.   MRN: [id number]  HPI Patient here today for knot of vein in right leg. This was first noticed a few weeks ago, but has gotten bigger and more painful over the last few days. The patient noticed this after doing cross fit. And it has not gotten any better and maybe has gotten somewhat worse since that time. She is taking birth control pills. 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 Victorino Dike with [facility] orthopedics and sports medicine.       There are no active problems to display for this patient.  Outpatient Encounter Prescriptions as of 07/05/2014  Medication Sig  . cetirizine (ZYRTEC) 10 MG tablet Take 1 tablet (10 mg total) by mouth daily.  . fluticasone (FLONASE) 50 MCG/ACT nasal spray Place 2 sprays into both nostrils daily.  . norgestrel-ethinyl estradiol (CRYSELLE-28) 0.3-30 MG-MCG tablet Take 1 tablet by mouth daily.  . [DISCONTINUED] nitrofurantoin, macrocrystal-monohydrate, (MACROBID) 100 MG capsule Take 1 capsule (100 mg total) by mouth 2 (two) times daily. 1 po BId  . [DISCONTINUED] phenazopyridine (PYRIDIUM) 100 MG tablet Take 1 tablet (100 mg total) by mouth 3 (three) times daily as needed for pain.   No facility-administered encounter medications on file as of 07/05/2014.   ' Review of Systems  Constitutional: Negative.   HENT: Negative.   Eyes: Negative.   Respiratory: Negative.   Cardiovascular: Negative.   Gastrointestinal: Negative.   Endocrine: Negative.   Genitourinary: Negative.   Musculoskeletal: Negative.   Skin: Negative.        Knot of vein - right leg  Allergic/Immunologic: Negative.   Neurological: Negative.   Hematological: Negative.   Psychiatric/Behavioral: Negative.        Objective:   Physical Exam  Constitutional: She is oriented to person, place, and time. She appears well-developed and well-nourished. No distress.    Musculoskeletal: Normal range of motion. She exhibits tenderness. She exhibits no edema.  The Homans sign was negative. With the patient standing there was a small bruise/venous abnormality that was especially tender in the right thigh medially. There was no knot palpable.  Neurological: She is alert and oriented to person, place, and time.  Skin: Skin is warm and dry. No rash noted.  Psychiatric: She has a normal mood and affect. Her behavior is normal. Judgment and thought content normal.  Nursing note and vitals reviewed.  BP 106/73 mmHg  Pulse 62  Temp(Src) 98.4 F (36.9 C) (Oral)  Ht 5\' 5"  (1.651 m)  Wt 130 lb (58.968 kg)  BMI 21.63 kg/m2  LMP 07/05/2014        Assessment & Plan:  1. Pain of right lower leg -Take Ascriptin twice daily after breakfast and supper and use warm wet compresses to the area of - US Venous Img Lower Unilateral Right; Future  2. Bruising - US Venous Img Lower Unilateral Right; Future  3. Pain of right lower extremity -Take Ascriptin and use moist heat  Patient Instructions  Use warm wet compresses 20 minutes 3 or 4 times daily Take Ascriptin 325 mg one twice daily after breakfast and supper We will arrange for venous Dopplers to further evaluate this   Nyra Capes MD

## 2014-07-05 NOTE — Patient Instructions (Signed)
Use warm wet compresses 20 minutes 3 or 4 times daily Take Ascriptin 325 mg one twice daily after breakfast and supper We will arrange for venous Dopplers to further evaluate this

## 2014-07-05 NOTE — Telephone Encounter (Signed)
Called patient and she stated that she called this am and appt was given for 9:30

## 2014-07-27 ENCOUNTER — Telehealth: Payer: BC Managed Care – PPO | Admitting: Nurse Practitioner

## 2014-07-27 DIAGNOSIS — N3000 Acute cystitis without hematuria: Secondary | ICD-10-CM

## 2014-07-27 MED ORDER — NITROFURANTOIN MONOHYD MACRO 100 MG PO CAPS: 100.0000 mg | ORAL_CAPSULE | Freq: Two times a day (BID) | ORAL | Status: DC

## 2014-07-27 MED ORDER — FLUCONAZOLE 150 MG PO TABS: 150.0000 mg | ORAL_TABLET | Freq: Once | ORAL | Status: DC

## 2014-07-27 NOTE — Progress Notes (Signed)
We are sorry that you are not feeling well.  Here is how we plan to help!  Based on what you shared with me it looks like you most likely have a simple urinary tract infection.  A UTI (Urinary Tract Infection) is a bacterial infection of the bladder.  Most cases of urinary tract infections are simple to treat but a key part of your care is to encourage you to drink plenty of fluids and watch your symptoms carefully.  I have prescribed MacroBid 100 mg twice a day for 5 days.  Your symptoms should gradually improve. Call us if the burning in your urine worsens, you develop worsening fever, back pain or pelvic pain or if your symptoms do not resolve after completing the antibiotic.  Urinary tract infections can be prevented by drinking plenty of water to keep your body hydrated.  Also be sure when you wipe, wipe from front to back and don't hold it in!  If possible, empty your bladder every 4 hours.  Your e-visit answers were reviewed by a board certified advanced clinical practitioner to complete your personal care plan.  Depending on the condition, your plan could have included both over the counter or prescription medications.  If there is a problem please reply  once you have received a response from your provider.  Your safety is important to us.  If you have drug allergies check your prescription carefully.    You can use MyChart to ask questions about today's visit, request a non-urgent call back, or ask for a work or school excuse.  You will get an e-mail in the next two days asking about your experience.  I hope that your e-visit has been valuable and will speed your recovery. Thank you for using e-visits.    

## 2014-08-09 ENCOUNTER — Ambulatory Visit (INDEPENDENT_AMBULATORY_CARE_PROVIDER_SITE_OTHER): Payer: BC Managed Care – PPO | Admitting: Physician Assistant

## 2014-08-09 ENCOUNTER — Encounter: Payer: Self-pay | Admitting: Physician Assistant

## 2014-08-09 VITALS — BP 111/70 | HR 90 | Temp 98.8°F | Ht 65.0 in | Wt 127.4 lb

## 2014-08-09 DIAGNOSIS — J309 Allergic rhinitis, unspecified: Secondary | ICD-10-CM

## 2014-08-09 DIAGNOSIS — R6889 Other general symptoms and signs: Secondary | ICD-10-CM | POA: Diagnosis not present

## 2014-08-09 DIAGNOSIS — R52 Pain, unspecified: Secondary | ICD-10-CM

## 2014-08-09 LAB — POCT INFLUENZA A/B
INFLUENZA A, POC: NEGATIVE
INFLUENZA B, POC: NEGATIVE

## 2014-08-09 MED ORDER — MONTELUKAST SODIUM 10 MG PO TABS: 10.0000 mg | ORAL_TABLET | Freq: Every day | ORAL | Status: DC

## 2014-08-09 NOTE — Progress Notes (Signed)
   Subjective:    Patient ID: Tonya Lee, female    DOB: 03-11-1985, 29 y.o.   MRN: [id number]  HPI 29 y/o female presents for c/o nasal stuffiness and congestion. She states that symptoms started acutely 2 days ago during the night with a raw, sore throat. She is now having congestion . She has took Flonase, zyrtec  with no relief. Aching pain in back and hips.   She states that she had surgery 2 weeks ago at [facility], outpatient.     Review of Systems  Constitutional: Positive for chills, diaphoresis and fatigue. Negative for fever.  HENT: Positive for congestion (nasal ), ear pain (bilateral ), postnasal drip, rhinorrhea, sinus pressure (under bilateral eyes ), sneezing and sore throat.   Eyes: Negative.   Respiratory: Positive for cough (nonproductive ). Negative for shortness of breath and wheezing.   Cardiovascular: Negative.   Gastrointestinal: Positive for nausea.  Endocrine: Negative.   Genitourinary: Negative.   Musculoskeletal: Negative.   Skin: Negative.   Neurological: Negative.        Objective:   Physical Exam  Constitutional: She appears well-developed and well-nourished. No distress.  HENT:  Head: Normocephalic and atraumatic.  Right Ear: External ear normal.  Left Ear: External ear normal.  Nose: Nose normal.  Posterior pharynx injection bilaterally  Bilateral nasal turbinates boggy and erythematous  Cardiovascular: Normal rate.   No murmur heard. Pulmonary/Chest: Effort normal and breath sounds normal. No respiratory distress. She has no wheezes. She has no rales. She exhibits no tenderness.  Skin: She is not diaphoretic.  Psychiatric: She has a normal mood and affect. Her behavior is normal. Judgment and thought content normal.  Nursing note and vitals reviewed.         Assessment & Plan:  1. Flu-like symptoms  - POCT Influenza A/B  2. Body aches  - POCT Influenza A/B  3. Allergic rhinitis, unspecified allergic rhinitis  type - Avoid cat in your in laws house if at all possible - stop zyrtec - continue flonase - otc plain sudafed for ear pressure relief - montelukast (SINGULAIR) 10 MG tablet; Take 1 tablet (10 mg total) by mouth at bedtime.  Dispense: 30 tablet; Refill: 3   RTC in 2-3 weeks if still having symptoms, will consider referral to allergist.   Tiffany A. Chauncey Reading PA-C

## 2014-08-09 NOTE — Patient Instructions (Signed)
  Over the counter sudafed (plain) for ear pressure relief   Allergic Rhinitis Allergic rhinitis is when the mucous membranes in the nose respond to allergens. Allergens are particles in the air that cause your body to have an allergic reaction. This causes you to release allergic antibodies. Through a chain of events, these eventually cause you to release histamine into the blood stream. Although meant to protect the body, it is this release of histamine that causes your discomfort, such as frequent sneezing, congestion, and an itchy, runny nose.  CAUSES  Seasonal allergic rhinitis (hay fever) is caused by pollen allergens that may come from grasses, trees, and weeds. Year-round allergic rhinitis (perennial allergic rhinitis) is caused by allergens such as house dust mites, pet dander, and mold spores.  SYMPTOMS   Nasal stuffiness (congestion).  Itchy, runny nose with sneezing and tearing of the eyes. DIAGNOSIS  Your health care provider can help you determine the allergen or allergens that trigger your symptoms. If you and your health care provider are unable to determine the allergen, skin or blood testing may be used. TREATMENT  Allergic rhinitis does not have a cure, but it can be controlled by:  Medicines and allergy shots (immunotherapy).  Avoiding the allergen. Hay fever may often be treated with antihistamines in pill or nasal spray forms. Antihistamines block the effects of histamine. There are over-the-counter medicines that may help with nasal congestion and swelling around the eyes. Check with your health care provider before taking or giving this medicine.  If avoiding the allergen or the medicine prescribed do not work, there are many new medicines your health care provider can prescribe. Stronger medicine may be used if initial measures are ineffective. Desensitizing injections can be used if medicine and avoidance does not work. Desensitization is when a patient is given ongoing  shots until the body becomes less sensitive to the allergen. Make sure you follow up with your health care provider if problems continue. HOME CARE INSTRUCTIONS It is not possible to completely avoid allergens, but you can reduce your symptoms by taking steps to limit your exposure to them. It helps to know exactly what you are allergic to so that you can avoid your specific triggers. SEEK MEDICAL CARE IF:   You have a fever.  You develop a cough that does not stop easily (persistent).  You have shortness of breath.  You start wheezing.  Symptoms interfere with normal daily activities. Document Released: 09/22/2000 Document Revised: 01/02/2013 Document Reviewed: 09/04/2012 [facility] Patient Information 2015 [place], [place]. This information is not intended to replace advice given to you by your health care provider. Make sure you discuss any questions you have with your health care provider.

## 2014-08-10 ENCOUNTER — Telehealth: Payer: Self-pay | Admitting: Physician Assistant

## 2014-08-10 NOTE — Telephone Encounter (Signed)
Patient states the she is feeling any better. She states that she is running a low grade fever of 99 and I advised patient that was not considered a fever and to continue what Tiffany had suggested. If she sees no improvement by tomorrow to call the provider on call. Or if her fever was greater that 100.4.

## 2014-08-11 ENCOUNTER — Other Ambulatory Visit: Payer: Self-pay | Admitting: Family Medicine

## 2014-08-11 ENCOUNTER — Telehealth: Payer: Self-pay | Admitting: Family Medicine

## 2014-08-11 MED ORDER — ONDANSETRON 8 MG PO TBDP
8.0000 mg | ORAL_TABLET | Freq: Four times a day (QID) | ORAL | Status: DC | PRN
Start: 2014-08-11 — End: 2014-09-12

## 2014-08-11 MED ORDER — LEVOFLOXACIN 500 MG PO TABS: 500.0000 mg | ORAL_TABLET | Freq: Every day | ORAL | Status: DC

## 2014-08-11 NOTE — Telephone Encounter (Signed)
Patient called in stating that she was doing far worse. She is chilling and sweating and cannot control nausea. She is using Sudafed and Tylenol and a "rainbow colored bottle" of nausea medicine that she obtained over-the-counter. At this point she is so uncomfortable she found herself just laying down on the tile in her bathroom to try to cool off. Her cough continues to be dry but persistent and constant.  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 Antibiotic treatment recommended. Levofloxacin 500 mg daily for 7 days and ondansetron for the nausea 8 mg 4 times a day when necessary. Pursue oral hydration, bland diet, continue the Sudafed as needed and ibuprofen for the chills and aches.  Mechele Claude, MD

## 2014-09-06 ENCOUNTER — Telehealth: Payer: Self-pay | Admitting: Family Medicine

## 2014-09-06 NOTE — Telephone Encounter (Signed)
Called and confirmed that we had received Records request from [facility]. Told them they would be done 09-12-14 by [facility].

## 2014-09-12 ENCOUNTER — Encounter: Payer: Self-pay | Admitting: Family Medicine

## 2014-09-12 ENCOUNTER — Ambulatory Visit (INDEPENDENT_AMBULATORY_CARE_PROVIDER_SITE_OTHER): Payer: BC Managed Care – PPO | Admitting: Family Medicine

## 2014-09-12 VITALS — BP 115/79 | HR 79 | Temp 98.2°F | Ht 65.0 in | Wt 126.4 lb

## 2014-09-12 DIAGNOSIS — J0101 Acute recurrent maxillary sinusitis: Secondary | ICD-10-CM

## 2014-09-12 DIAGNOSIS — J0100 Acute maxillary sinusitis, unspecified: Secondary | ICD-10-CM | POA: Insufficient documentation

## 2014-09-12 MED ORDER — FLUCONAZOLE 150 MG PO TABS: 150.0000 mg | ORAL_TABLET | Freq: Once | ORAL | Status: DC

## 2014-09-12 MED ORDER — AMOXICILLIN-POT CLAVULANATE 875-125 MG PO TABS: 1.0000 | ORAL_TABLET | Freq: Two times a day (BID) | ORAL | Status: DC

## 2014-09-12 NOTE — Progress Notes (Signed)
   HPI  Patient presents today  For sinus pressure and congestion  Patient's when she's had 2-3 days of head congestion, body aches, malaise, ear pain, and sore throat for the last 2 days.  She was ill with similar illnessbut less severe at the end of July treated with Levaquin and had good resolution.  He does have sick contacts at work.  She denies dyspnea, chest pain.   PMH: Smoking status noted ROS: Per HPI  Objective: BP 115/79 mmHg  Pulse 79  Temp(Src) 98.2 F (36.8 C) (Oral)  Ht  (1.651 m)  Wt 126 lb 6.4 oz (57.335 kg)  BMI 21.03 kg/m2 Gen: NAD, alert, cooperative with exam HEENT: NCAT, tonsils difficult to appreciate, TMsW LDL, nares withswelling of the turbinates bilateral,  maxillary sinus tenderness bilaterally CV: RRR, good S1/S2, no murmur Resp: CTABL, no wheezes, non-labored Ext: No edema, warm Neuro: Alert and oriented, No gross deficits  Assessment and plan:  # acute maxillary sinusitis Treat with Augmentin Discussed options of escalating allergy therapy, sounds like Singulair did not help. Continue Zyrtec and Flonase for now Consider changing to Nasonex versus Astelin nasal spray Diflucan for possible Yeast infection   Meds ordered this encounter  Medications  . amoxicillin-clavulanate (AUGMENTIN) 875-125 MG per tablet    Sig: Take 1 tablet by mouth 2 (two) times daily.    Dispense:  20 tablet    Refill:  0  . fluconazole (DIFLUCAN) 150 MG tablet    Sig: Take 1 tablet (150 mg total) by mouth once. And repeat in 3 days    Dispense:  2 tablet    Refill:  0    Murtis Sink, MD [facility] 09/12/2014, 4:11 PM

## 2014-09-12 NOTE — Patient Instructions (Signed)
Great to meet you!  Come back as needed, We could start a different brand nasal steroid (nasonex) or change to astelin (nasal antihistamine)  Sinusitis Sinusitis is redness, soreness, and inflammation of the paranasal sinuses. Paranasal sinuses are air pockets within the bones of your face (beneath the eyes, the middle of the forehead, or above the eyes). In healthy paranasal sinuses, mucus is able to drain out, and air is able to circulate through them by way of your nose. However, when your paranasal sinuses are inflamed, mucus and air can become trapped. This can allow bacteria and other germs to grow and cause infection. Sinusitis can develop quickly and last only a short time (acute) or continue over a long period (chronic). Sinusitis that lasts for more than 12 weeks is considered chronic.  CAUSES  Causes of sinusitis include:  Allergies.  Structural abnormalities, such as displacement of the cartilage that separates your nostrils (deviated septum), which can decrease the air flow through your nose and sinuses and affect sinus drainage.  Functional abnormalities, such as when the small hairs (cilia) that line your sinuses and help remove mucus do not work properly or are not present. SIGNS AND SYMPTOMS  Symptoms of acute and chronic sinusitis are the same. The primary symptoms are pain and pressure around the affected sinuses. Other symptoms include:  Upper toothache.  Earache.  Headache.  Bad breath.  Decreased sense of smell and taste.  A cough, which worsens when you are lying flat.  Fatigue.  Fever.  Thick drainage from your nose, which often is green and may contain pus (purulent).  Swelling and warmth over the affected sinuses. DIAGNOSIS  Your health care provider will perform a physical exam. During the exam, your health care provider may:  Look in your nose for signs of abnormal growths in your nostrils (nasal polyps).  Tap over the affected sinus to check for  signs of infection.  View the inside of your sinuses (endoscopy) using an imaging device that has a light attached (endoscope). If your health care provider suspects that you have chronic sinusitis, one or more of the following tests may be recommended:  Allergy tests.  Nasal culture. A sample of mucus is taken from your nose, sent to a lab, and screened for bacteria.  Nasal cytology. A sample of mucus is taken from your nose and examined by your health care provider to determine if your sinusitis is related to an allergy. TREATMENT  Most cases of acute sinusitis are related to a viral infection and will resolve on their own within 10 days. Sometimes medicines are prescribed to help relieve symptoms (pain medicine, decongestants, nasal steroid sprays, or saline sprays).  However, for sinusitis related to a bacterial infection, your health care provider will prescribe antibiotic medicines. These are medicines that will help kill the bacteria causing the infection.  Rarely, sinusitis is caused by a fungal infection. In theses cases, your health care provider will prescribe antifungal medicine. For some cases of chronic sinusitis, surgery is needed. Generally, these are cases in which sinusitis recurs more than 3 times per year, despite other treatments. HOME CARE INSTRUCTIONS   Drink plenty of water. Water helps thin the mucus so your sinuses can drain more easily.  Use a humidifier.  Inhale steam 3 to 4 times a day (for example, sit in the bathroom with the shower running).  Apply a warm, moist washcloth to your face 3 to 4 times a day, or as directed by your health care  provider.  Use saline nasal sprays to help moisten and clean your sinuses.  Take medicines only as directed by your health care provider.  If you were prescribed either an antibiotic or antifungal medicine, finish it all even if you start to feel better. SEEK IMMEDIATE MEDICAL CARE IF:  You have increasing pain or  severe headaches.  You have nausea, vomiting, or drowsiness.  You have swelling around your face.  You have vision problems.  You have a stiff neck.  You have difficulty breathing. MAKE SURE YOU:   Understand these instructions.  Will watch your condition.  Will get help right away if you are not doing well or get worse. Document Released: 12/28/2004 Document Revised: 05/14/2013 Document Reviewed: 01/12/2011 [facility] Patient Information 2015 [place], [place]. This information is not intended to replace advice given to you by your health care provider. Make sure you discuss any questions you have with your health care provider.

## 2014-12-06 ENCOUNTER — Telehealth: Payer: Self-pay | Admitting: Family

## 2014-12-06 DIAGNOSIS — N3000 Acute cystitis without hematuria: Secondary | ICD-10-CM

## 2014-12-06 MED ORDER — CIPROFLOXACIN HCL 500 MG PO TABS
500.0000 mg | ORAL_TABLET | Freq: Two times a day (BID) | ORAL | Status: DC
Start: 2014-12-06 — End: 2015-01-09

## 2014-12-06 NOTE — Progress Notes (Signed)

## 2014-12-11 ENCOUNTER — Telehealth: Payer: Self-pay | Admitting: Family Medicine

## 2014-12-20 NOTE — Telephone Encounter (Signed)
denied °

## 2015-01-09 ENCOUNTER — Encounter: Payer: Self-pay | Admitting: Nurse Practitioner

## 2015-01-09 ENCOUNTER — Ambulatory Visit: Payer: BC Managed Care – PPO

## 2015-01-09 ENCOUNTER — Ambulatory Visit (INDEPENDENT_AMBULATORY_CARE_PROVIDER_SITE_OTHER): Payer: BC Managed Care – PPO | Admitting: Nurse Practitioner

## 2015-01-09 VITALS — BP 126/93 | HR 77 | Temp 97.1°F | Ht 65.0 in | Wt 137.0 lb

## 2015-01-09 DIAGNOSIS — J0101 Acute recurrent maxillary sinusitis: Secondary | ICD-10-CM | POA: Diagnosis not present

## 2015-01-09 MED ORDER — AZITHROMYCIN 250 MG PO TABS: ORAL_TABLET | ORAL | Status: DC

## 2015-01-09 MED ORDER — CHLORPHEN-PE-ACETAMINOPHEN 4-10-325 MG PO TABS: 1.0000 | ORAL_TABLET | Freq: Four times a day (QID) | ORAL | Status: DC | PRN

## 2015-01-09 NOTE — Progress Notes (Signed)
  Subjective:     Tonya Lee is a 29 y.o. female who presents for evaluation of sinus pain. Symptoms include: congestion, cough, facial pain, foul breath, headaches, nasal congestion, post nasal drip, sinus pressure and sore throat. Onset of symptoms was 3 days ago. Symptoms have been gradually worsening since that time. Past history is significant for no history of pneumonia or bronchitis. Patient is a non-smoker.  The following portions of the patient's history were reviewed and updated as appropriate: allergies, current medications, past family history, past medical history, past social history, past surgical history and problem list.  Review of Systems Pertinent items are noted in HPI.   Objective:    General appearance: alert and cooperative Eyes: conjunctivae/corneas clear. PERRL, EOM's intact. Fundi benign. Ears: normal TM's and external ear canals both ears Nose: copious discharge, moderate congestion, sinus tenderness bilateral Throat: lips, mucosa, and tongue normal; teeth and gums normal Neck: no adenopathy, no carotid bruit, no JVD, supple, symmetrical, trachea midline and thyroid not enlarged, symmetric, no tenderness/mass/nodules Lungs: clear to auscultation bilaterally and deep dry cough Heart: regular rate and rhythm, S1, S2 normal, no murmur, click, rub or gallop    Assessment:    Acute bacterial sinusitis.    Plan:   1. Take meds as prescribed 2. Use a cool mist humidifier especially during the winter months and when heat has been humid. 3. Use saline nose sprays frequently 4. Saline irrigations of the nose can be very helpful if done frequently.  * 4X daily for 1 week*  * Use of a nettie pot can be helpful with this. Follow directions with this* 5. Drink plenty of fluids 6. Keep thermostat turn down low 7.For any cough or congestion  Use plain Mucinex- regular strength or max strength is fine   * Children- consult with Pharmacist for dosing 8. For fever or  aces or pains- take tylenol or ibuprofen appropriate for age and weight.  * for fevers greater than 101 orally you may alternate ibuprofen and tylenol every  3 hours.    Meds ordered this encounter  Medications  . azithromycin (ZITHROMAX) 250 MG tablet    Sig: Two tablets day one, then one tablet daily next 4 days.    Dispense:  6 tablet    Refill:  0    Order Specific Question:  Supervising Provider    Answer:  Ernestina PennaMOORE, DONALD W [[id number]]  . Chlorphen-PE-Acetaminophen 4-10-325 MG TABS    Sig: Take 1 tablet by mouth every 6 (six) hours as needed.    Dispense:  20 tablet    Refill:  0    Order Specific Question:  Supervising Provider    Answer:  Ernestina PennaMOORE, DONALD W [[id number]   Tonya Daphine DeutscherMartin, FNP

## 2015-01-09 NOTE — Patient Instructions (Signed)

## 2015-01-24 ENCOUNTER — Telehealth: Payer: Self-pay | Admitting: Family Medicine

## 2015-01-25 ENCOUNTER — Ambulatory Visit (INDEPENDENT_AMBULATORY_CARE_PROVIDER_SITE_OTHER): Payer: BC Managed Care – PPO | Admitting: Family Medicine

## 2015-01-25 ENCOUNTER — Encounter: Payer: Self-pay | Admitting: Family Medicine

## 2015-01-25 VITALS — BP 115/87 | HR 72 | Temp 98.0°F | Ht 65.0 in | Wt 128.2 lb

## 2015-01-25 DIAGNOSIS — R1084 Generalized abdominal pain: Secondary | ICD-10-CM | POA: Diagnosis not present

## 2015-01-25 DIAGNOSIS — N3000 Acute cystitis without hematuria: Secondary | ICD-10-CM | POA: Diagnosis not present

## 2015-01-25 DIAGNOSIS — A084 Viral intestinal infection, unspecified: Secondary | ICD-10-CM

## 2015-01-25 DIAGNOSIS — R112 Nausea with vomiting, unspecified: Secondary | ICD-10-CM

## 2015-01-25 LAB — POCT URINALYSIS DIPSTICK
BILIRUBIN UA: NEGATIVE
GLUCOSE UA: NEGATIVE
KETONES UA: NEGATIVE
Nitrite, UA: NEGATIVE
SPEC GRAV UA: 1.025
Urobilinogen, UA: NEGATIVE
pH, UA: 6.5

## 2015-01-25 LAB — POCT UA - MICROSCOPIC ONLY
CASTS, UR, LPF, POC: NEGATIVE
CRYSTALS, UR, HPF, POC: NEGATIVE
YEAST UA: NEGATIVE

## 2015-01-25 LAB — POCT URINE PREGNANCY: PREG TEST UR: NEGATIVE

## 2015-01-25 MED ORDER — ONDANSETRON 4 MG PO TBDP: 4.0000 mg | ORAL_TABLET | Freq: Three times a day (TID) | ORAL | Status: DC | PRN

## 2015-01-25 MED ORDER — NITROFURANTOIN MONOHYD MACRO 100 MG PO CAPS: 100.0000 mg | ORAL_CAPSULE | Freq: Two times a day (BID) | ORAL | Status: DC

## 2015-01-25 NOTE — Progress Notes (Signed)
BP 115/87 mmHg  Pulse 72  Temp(Src) 98 F (36.7 C) (Oral)  Ht 5\' 5"  (1.651 m)  Wt 128 lb 3.2 oz (58.151 kg)  BMI 21.33 kg/m2  LMP 01/11/2015 (Approximate)   Subjective:    Patient ID: Tonya Lee, female    DOB: 01/01/1986, 30 y.o.   MRN: [id number]  HPI: Tonya Lee is a 30 y.o. female presenting on 01/25/2015 for Nausea & Diarrhea   HPI Nausea and diarrhea and abdominal pain Patient comes in complaining of a three-day history of nausea and vomiting and diarrhea and abdominal pain. The abdominal pain is diffuse. 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 Her colleague has gotten better before her. She has had some sweats but no fevers or chills. She denies any blood in her urine or blood in her stool. When asked about the possibility of being pregnant she did say that she missed a week of her birth control just recently so that is a possibility. Her diarrhea has been multiple times per day up to 6 or 7. Nausea and vomiting has been a couple times a day. She feels nauseous throughout though and feels like she cannot eat throughout the day. She denies any blood in her vomit. She has been keeping sufficient fluid intake.  Relevant past medical, surgical, family and social history reviewed and updated as indicated. Interim medical history since our last visit reviewed. Allergies and medications reviewed and updated.  Review of Systems  Constitutional: Positive for appetite change. Negative for fever and chills.  HENT: Negative for congestion, ear discharge and ear pain.   Eyes: Negative for redness and visual disturbance.  Respiratory: Negative for chest tightness and shortness of breath.   Cardiovascular: Negative for chest pain and leg swelling.  Gastrointestinal: Positive for nausea, vomiting, abdominal pain and diarrhea. Negative for constipation, blood in stool and anal bleeding.  Genitourinary: Negative for dysuria and difficulty  urinating.  Musculoskeletal: Negative for back pain and gait problem.  Skin: Negative for rash.  Neurological: Negative for dizziness, light-headedness and headaches.  Psychiatric/Behavioral: Negative for behavioral problems and agitation.  All other systems reviewed and are negative.   Per HPI unless specifically indicated above     Medication List       This list is accurate as of: 01/25/15  8:59 AM.  Always use your most recent med list.               cetirizine 10 MG tablet  Commonly known as:  ZYRTEC  Take 10 mg by mouth daily.     CRYSELLE-28 0.3-30 MG-MCG tablet  Generic drug:  norgestrel-ethinyl estradiol  Take 1 tablet by mouth daily.           Objective:    BP 115/87 mmHg  Pulse 72  Temp(Src) 98 F (36.7 C) (Oral)  Ht 5\' 5"  (1.651 m)  Wt 128 lb 3.2 oz (58.151 kg)  BMI 21.33 kg/m2  LMP 01/11/2015 (Approximate)  Wt Readings from Last 3 Encounters:  01/25/15 128 lb 3.2 oz (58.151 kg)  01/09/15 137 lb (62.143 kg)  09/12/14 126 lb 6.4 oz (57.335 kg)    Physical Exam  Constitutional: She is oriented to person, place, and time. She appears well-developed and well-nourished. No distress.  Eyes: Conjunctivae and EOM are normal. Pupils are equal, round, and reactive to light.  Cardiovascular: Normal rate, regular rhythm, normal heart sounds and intact distal pulses.   No murmur heard. Pulmonary/Chest:  Effort normal and breath sounds normal. No respiratory distress. She has no wheezes.  Abdominal: Soft. Bowel sounds are normal. She exhibits no distension and no mass. There is generalized tenderness. There is no rigidity, no rebound, no guarding, no CVA tenderness, no tenderness at McBurney's point and negative Murphy's sign.  Musculoskeletal: Normal range of motion. She exhibits no edema or tenderness.  Neurological: She is alert and oriented to person, place, and time. Coordination normal.  Skin: Skin is warm and dry. No rash noted. She is not diaphoretic.    Psychiatric: She has a normal mood and affect. Her behavior is normal.  Nursing note and vitals reviewed.   Results for orders placed or performed in visit on 08/09/14  POCT Influenza A/B  Result Value Ref Range   Influenza A, POC Negative Negative   Influenza B, POC Negative Negative      Assessment & Plan:   Problem List Items Addressed This Visit    None    Visit Diagnoses    Non-intractable vomiting with nausea, unspecified vomiting type    -  Primary    Urine pregnancy was negative    Relevant Orders    POCT urine pregnancy (Completed)    POCT urinalysis dipstick (Completed)    POCT UA - Microscopic Only (Completed)    Generalized abdominal pain        Relevant Orders    POCT urine pregnancy (Completed)    POCT urinalysis dipstick (Completed)    POCT UA - Microscopic Only (Completed)    Viral gastroenteritis        Relevant Medications    ondansetron (ZOFRAN ODT) 4 MG disintegrating tablet    Acute cystitis without hematuria        Relevant Medications    nitrofurantoin, macrocrystal-monohydrate, (MACROBID) 100 MG capsule        Follow up plan: Return if symptoms worsen or fail to improve.  Counseling provided for all of the vaccine components Orders Placed This Encounter  Procedures  . POCT urine pregnancy  . POCT urinalysis dipstick  . POCT UA - Microscopic Only    Arville CareJoshua Dettinger, MD [facility] 01/25/2015, 8:59 AM

## 2015-01-27 LAB — URINE CULTURE

## 2015-01-29 ENCOUNTER — Telehealth: Payer: Self-pay | Admitting: Family Medicine

## 2015-01-29 ENCOUNTER — Ambulatory Visit: Payer: BC Managed Care – PPO | Admitting: Family Medicine

## 2015-01-29 NOTE — Telephone Encounter (Signed)
Patient states that she has been having severe abdominal pain at the umbilical area that comes and goes. Patient states that she had to go to work this morning and would not be able to come in until this afernoon. Patient advised that if she starts running a fever and vomiting then to go to the ER. Patient given appointment this afternoon with Dettinger at 4:25.

## 2015-01-30 ENCOUNTER — Emergency Department (HOSPITAL_COMMUNITY): Payer: BC Managed Care – PPO

## 2015-01-30 ENCOUNTER — Telehealth: Payer: Self-pay | Admitting: Family Medicine

## 2015-01-30 ENCOUNTER — Emergency Department (HOSPITAL_COMMUNITY)
Admission: EM | Admit: 2015-01-30 | Discharge: 2015-01-30 | Disposition: A | Discharge status: Home / Self Care | Payer: BC Managed Care – PPO | Attending: Emergency Medicine | Admitting: Emergency Medicine

## 2015-01-30 ENCOUNTER — Encounter (HOSPITAL_COMMUNITY): Payer: Self-pay | Admitting: Emergency Medicine

## 2015-01-30 ENCOUNTER — Encounter: Payer: Self-pay | Admitting: Family Medicine

## 2015-01-30 DIAGNOSIS — Z79899 Other long term (current) drug therapy: Secondary | ICD-10-CM | POA: Diagnosis not present

## 2015-01-30 DIAGNOSIS — Z793 Long term (current) use of hormonal contraceptives: Secondary | ICD-10-CM | POA: Insufficient documentation

## 2015-01-30 DIAGNOSIS — Z87440 Personal history of urinary (tract) infections: Secondary | ICD-10-CM | POA: Insufficient documentation

## 2015-01-30 DIAGNOSIS — Z8679 Personal history of other diseases of the circulatory system: Secondary | ICD-10-CM | POA: Diagnosis not present

## 2015-01-30 DIAGNOSIS — Z3202 Encounter for pregnancy test, result negative: Secondary | ICD-10-CM | POA: Diagnosis not present

## 2015-01-30 DIAGNOSIS — Z8739 Personal history of other diseases of the musculoskeletal system and connective tissue: Secondary | ICD-10-CM | POA: Diagnosis not present

## 2015-01-30 DIAGNOSIS — R1033 Periumbilical pain: Secondary | ICD-10-CM | POA: Insufficient documentation

## 2015-01-30 DIAGNOSIS — R110 Nausea: Secondary | ICD-10-CM | POA: Insufficient documentation

## 2015-01-30 LAB — CBC
HEMATOCRIT: 43.2 % (ref 36.0–46.0)
HEMOGLOBIN: 14.3 g/dL (ref 12.0–15.0)
MCH: 29.8 pg (ref 26.0–34.0)
MCHC: 33.1 g/dL (ref 30.0–36.0)
MCV: 90 fL (ref 78.0–100.0)
PLATELETS: 396 10*3/uL (ref 150–400)
RBC: 4.8 MIL/uL (ref 3.87–5.11)
RDW: 12.3 % (ref 11.5–15.5)
WBC: 7.9 10*3/uL (ref 4.0–10.5)

## 2015-01-30 LAB — COMPREHENSIVE METABOLIC PANEL
ALBUMIN: 4.7 g/dL (ref 3.5–5.0)
ALT: 13 U/L — ABNORMAL LOW (ref 14–54)
ANION GAP: 11 (ref 5–15)
AST: 16 U/L (ref 15–41)
Alkaline Phosphatase: 36 U/L — ABNORMAL LOW (ref 38–126)
BILIRUBIN TOTAL: 0.8 mg/dL (ref 0.3–1.2)
BUN: 11 mg/dL (ref 6–20)
CO2: 25 mmol/L (ref 22–32)
Calcium: 9.5 mg/dL (ref 8.9–10.3)
Chloride: 107 mmol/L (ref 101–111)
Creatinine, Ser: 0.81 mg/dL (ref 0.44–1.00)
GFR calc Af Amer: >60 mL/min (ref >60)
GFR calc non Af Amer: >60 mL/min (ref >60)
GLUCOSE: 100 mg/dL — AB (ref 65–99)
POTASSIUM: 4 mmol/L (ref 3.5–5.1)
SODIUM: 143 mmol/L (ref 135–145)
TOTAL PROTEIN: 7.8 g/dL (ref 6.5–8.1)

## 2015-01-30 LAB — URINALYSIS, ROUTINE W REFLEX MICROSCOPIC
BILIRUBIN URINE: NEGATIVE
Glucose, UA: NEGATIVE mg/dL
HGB URINE DIPSTICK: NEGATIVE
KETONES UR: NEGATIVE mg/dL
Leukocytes, UA: NEGATIVE
NITRITE: NEGATIVE
PROTEIN: NEGATIVE mg/dL
SPECIFIC GRAVITY, URINE: 1.026 (ref 1.005–1.030)
pH: 7.5 (ref 5.0–8.0)

## 2015-01-30 LAB — LIPASE, BLOOD: LIPASE: 34 U/L (ref 11–51)

## 2015-01-30 LAB — HCG, QUANTITATIVE, PREGNANCY

## 2015-01-30 MED ORDER — IOHEXOL 300 MG/ML  SOLN
50.0000 mL | Freq: Once | INTRAMUSCULAR | Status: DC | PRN
  Administered 2015-01-30: 50 mL via ORAL

## 2015-01-30 MED ORDER — SODIUM CHLORIDE 0.9 % IV BOLUS (SEPSIS)
1000.0000 mL | Freq: Once | INTRAVENOUS | Status: AC
  Administered 2015-01-30: 1000 mL via INTRAVENOUS

## 2015-01-30 MED ORDER — IOHEXOL 300 MG/ML  SOLN
100.0000 mL | Freq: Once | INTRAMUSCULAR | Status: AC | PRN
  Administered 2015-01-30: 100 mL via INTRAVENOUS

## 2015-01-30 MED ORDER — ONDANSETRON HCL 4 MG PO TABS: 4.0000 mg | ORAL_TABLET | Freq: Four times a day (QID) | ORAL | Status: DC

## 2015-01-30 MED ORDER — PANTOPRAZOLE SODIUM 20 MG PO TBEC: 20.0000 mg | DELAYED_RELEASE_TABLET | Freq: Every day | ORAL | Status: DC

## 2015-01-30 MED ORDER — ONDANSETRON HCL 4 MG/2ML IJ SOLN
4.0000 mg | Freq: Once | INTRAMUSCULAR | Status: AC
  Administered 2015-01-30: 4 mg via INTRAVENOUS
  Filled 2015-01-30: qty 2

## 2015-01-30 MED ORDER — MORPHINE SULFATE (PF) 4 MG/ML IV SOLN
4.0000 mg | Freq: Once | INTRAVENOUS | Status: AC
  Administered 2015-01-30: 4 mg via INTRAVENOUS
  Filled 2015-01-30: qty 1

## 2015-01-30 NOTE — ED Provider Notes (Signed)
CSN: [id number]     Arrival date & time 01/30/15  0901 History   First MD Initiated Contact with Patient 01/30/15 1222     Chief Complaint  Patient presents with  . Abdominal Pain     (Consider location/radiation/quality/duration/timing/severity/associated sxs/prior Treatment) HPI  Pt presenting with c/o periumbilical pain.  She states last week she had vomtiing and diarrhea which had improved, she saw her doctor 6 days ago and was started on macrobid for UTI as well.  She was feeling improved until yesterday she began to have pain in her mid abdomen and epigastric region.  States pain feels like a knawing sensation.  Has had some nausea, no vomiting or change in stools yesterday or today.  Staets her dysuria as resolved.  No fever/chills.  There are no other associated systemic symptoms, there are no other alleviating or modifying factors.   Past Medical History  Diagnosis Date  . Migraines   . Compartment syndrome of right lower extremity [facility])    Past Surgical History  Procedure Laterality Date  . Ankle surgery Right july 2013  . Fasciectomy Right     right lower leg   Family History  Problem Relation Age of Onset  . Asthma Mother   . Hyperlipidemia Maternal Grandmother   . Hypertension Maternal Grandmother   . Asthma Maternal Grandmother   . Heart disease Maternal Grandfather   . Cancer Maternal Grandfather     thyroid  . Diabetes Maternal Grandfather   . Heart disease Paternal Grandfather    Social History  Substance Use Topics  . Smoking status: Never Smoker   . Smokeless tobacco: Never Used  . Alcohol Use: No   OB History    No data available     Review of Systems  ROS reviewed and all otherwise negative except for mentioned in HPI    Allergies  Sulfa antibiotics; Acyclovir and related; and Nitrofuran derivatives  Home Medications   Prior to Admission medications   Medication Sig Start Date End Date Taking? Authorizing Provider  cetirizine (ZYRTEC) 10  MG tablet Take 10 mg by mouth daily as needed for allergies.    Yes Historical Provider, MD  nitrofurantoin, macrocrystal-monohydrate, (MACROBID) 100 MG capsule Take 1 capsule (100 mg total) by mouth 2 (two) times daily. 01/25/15  Yes Elige Radon Dettinger, MD  norgestrel-ethinyl estradiol (CRYSELLE-28) 0.3-30 MG-MCG tablet Take 1 tablet by mouth daily.   Yes Historical Provider, MD  ondansetron (ZOFRAN ODT) 4 MG disintegrating tablet Take 1 tablet (4 mg total) by mouth every 8 (eight) hours as needed for nausea or vomiting. 01/25/15  Yes Elige Radon Dettinger, MD  ondansetron (ZOFRAN) 4 MG tablet Take 1 tablet (4 mg total) by mouth every 6 (six) hours. 01/30/15   Jerelyn Scott, MD  pantoprazole (PROTONIX) 20 MG tablet Take 1 tablet (20 mg total) by mouth daily. 01/30/15   Jerelyn Scott, MD   BP 111/71 mmHg  Pulse 78  Temp(Src) 98.5 F (36.9 C) (Oral)  Resp 16  Ht  (1.651 m)  Wt 131 lb (59.421 kg)  BMI 21.80 kg/m2  SpO2 100%  LMP 01/11/2015 (Approximate)  Vitals reviewed Physical Exam  Physical Examination: General appearance - alert, well appearing, and in no distress Mental status - alert, oriented to person, place, and time Eyes - no conjunctiva injection no scleral icterus Mouth - mucous membranes moist, pharynx normal without lesions Chest - clear to auscultation, no wheezes, rales or rhonchi, symmetric air entry Heart - normal rate, regular  rhythm, normal S1, S2, no murmurs, rubs, clicks or gallops Abdomen - soft, tender to palpation at periumbilical region, nabs, nondistended, no masses or organomegaly Neurological - alert, oriented, normal speech Extremities - peripheral pulses normal, no pedal edema, no clubbing or cyanosis Skin - normal coloration and turgor, no rashes  ED Course  Procedures (including critical care time) Labs Review Labs Reviewed  COMPREHENSIVE METABOLIC PANEL - Abnormal; Notable for the following:    Glucose, Bld 100 (*)    ALT 13 (*)    Alkaline  Phosphatase 36 (*)    All other components within normal limits  LIPASE, BLOOD  CBC  URINALYSIS, ROUTINE W REFLEX MICROSCOPIC (NOT AT [facility])  HCG, QUANTITATIVE, PREGNANCY    Imaging Review Ct Abdomen Pelvis W Contrast  01/30/2015  CLINICAL DATA:  Periumbilical abdominal pain wall worse today. Recently diagnosed with a urinary tract infection. Nausea. EXAM: CT ABDOMEN AND PELVIS WITH CONTRAST TECHNIQUE: Multidetector CT imaging of the abdomen and pelvis was performed using the standard protocol following bolus administration of intravenous contrast. CONTRAST:  OMNIPAQUE IOHEXOL 300 MG/ML  SOLN COMPARISON:  None. FINDINGS: The visualized lung bases are clear. There is minimal periportal edema. A subcentimeter low-density lesion in the inferior right hepatic lobe is too small to fully characterize. The gallbladder, spleen, adrenal glands, left kidney, and pancreas are unremarkable. There is a 3 mm nonobstructing calculus in the interpolar right kidney. Oral contrast is present in multiple nondilated loops of small bowel. There is no evidence of bowel obstruction or inflammation. The appendix is identified in the right lower quadrant and is unremarkable. The bladder is mildly distended and unremarkable. The uterus and ovaries are grossly unremarkable. No free fluid or enlarged lymph nodes are identified. No abdominal wall hernia is seen. No acute osseous abnormality is identified. IMPRESSION: 1. Punctate, nonobstructing right renal calculus. 2. Trace periportal edema, nonspecific. Electronically Signed   By: Sebastian Ache M.D.   On: 01/30/2015 14:57   I have personally reviewed and evaluated these images and lab results as part of my medical decision-making.   EKG Interpretation None      MDM   Final diagnoses:  Periumbilical abdominal pain    Pt presentnig with c/o periumblicial abdominal pain and nausea, labs are reassuring, CT abdomen obtained and reassuring as well.  LFTs are normal so  does not need ultrasound emergently now- but may be helpful to obtain as outpatitnet- however no gallbladder abnormality on CT scan.  Symptoms may be due to gastritis, given rx for zofran and PPI.  Discharged with strict return precautions.  Pt agreeable with plan.    Jerelyn Scott, MD 01/31/15 [phone]

## 2015-01-30 NOTE — ED Notes (Signed)
Pt states that she was dx with UTI on Saturday.  States that she began having periumbilical, rt sided abd pain.  Worsening today with nausea.

## 2015-01-30 NOTE — Telephone Encounter (Signed)
Pt aware [facility] appt made 

## 2015-01-30 NOTE — Discharge Instructions (Signed)
Return to the ED with any concerns including vomiting and not able to keep down liquids, fever/chills, worsening abdominal pain- especially if it localizes to the right lower abdomen, decreased level of alertness/lethargy, or any other alarming symptoms °

## 2015-01-31 ENCOUNTER — Ambulatory Visit (INDEPENDENT_AMBULATORY_CARE_PROVIDER_SITE_OTHER): Payer: BC Managed Care – PPO | Admitting: Family Medicine

## 2015-01-31 ENCOUNTER — Encounter: Payer: Self-pay | Admitting: Family Medicine

## 2015-01-31 VITALS — BP 117/81 | HR 71 | Temp 98.3°F | Ht 65.0 in | Wt 128.0 lb

## 2015-01-31 DIAGNOSIS — K279 Peptic ulcer, site unspecified, unspecified as acute or chronic, without hemorrhage or perforation: Secondary | ICD-10-CM

## 2015-01-31 MED ORDER — PANTOPRAZOLE SODIUM 20 MG PO TBEC: 20.0000 mg | DELAYED_RELEASE_TABLET | Freq: Every day | ORAL | Status: DC

## 2015-01-31 MED ORDER — ONDANSETRON 4 MG PO TBDP: 4.0000 mg | ORAL_TABLET | Freq: Three times a day (TID) | ORAL | Status: DC | PRN

## 2015-01-31 NOTE — Progress Notes (Signed)
BP 117/81 mmHg  Pulse 71  Temp(Src) 98.3 F (36.8 C) (Oral)  Ht  (1.651 m)  Wt 128 lb (58.06 kg)  BMI 21.30 kg/m2  LMP 01/11/2015 (Approximate)   Subjective:    Patient ID: Tonya Lee, female    DOB: [birth year], 30 y.o.   MRN: [id number]  HPI: Tonya Lee is a 30 y.o. female presenting on 01/31/2015 for Abdominal Pain and Vomiting   HPI Abdominal pain and vomiting Patient has been having persistent abdominal pain and vomiting. The pain is in the epigastrium just above the umbilicus. She was seen in our office on 01/25/2015 and was diagnosed with a UTI and given an antibiotic for that which she has finished. The urinary symptoms have completely cleared and her lower abdominal and flank pain that she had has completely cleared. The thing that has stuck around his epigastric abdominal pain. It improved slightly and then worsened over the past few days and yesterday she ended up in the emergency department and had a CT scan of the abdomen which was clear of any abnormalities. She continues to have this epigastric abdominal pain but it is slightly improved since they started Protonix yesterday. She denies any blood in her vomit she denies any blood in her stool. She thought it might have been a gastroenteritis at first but that it is persisted and return. 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 She has been taking daily NSAIDs over this time. The pain is 7 out of 10 does not radiate anywhere else.  Relevant past medical, surgical, family and social history reviewed and updated as indicated. Interim medical history since our last visit reviewed. Allergies and medications reviewed and updated.  Review of Systems  Constitutional: Negative for fever and chills.  HENT: Negative for congestion, ear discharge and ear pain.   Eyes: Negative for redness and visual  disturbance.  Respiratory: Negative for chest tightness and shortness of breath.   Cardiovascular: Negative for chest pain and leg swelling.  Gastrointestinal: Positive for nausea, vomiting and abdominal pain. Negative for diarrhea and constipation.  Genitourinary: Negative for dysuria, urgency, frequency, flank pain and difficulty urinating.  Musculoskeletal: Negative for back pain and gait problem.  Skin: Negative for color change and rash.  Neurological: Negative for dizziness, light-headedness and headaches.  Psychiatric/Behavioral: Negative for behavioral problems and agitation.  All other systems reviewed and are negative.   Per HPI unless specifically indicated above     Medication List       This list is accurate as of: 01/31/15  2:49 PM.  Always use your most recent med list.               cetirizine 10 MG tablet  Commonly known as:  ZYRTEC  Take 10 mg by mouth daily as needed for allergies.     CRYSELLE-28 0.3-30 MG-MCG tablet  Generic drug:  norgestrel-ethinyl estradiol  Take 1 tablet by mouth daily.     ondansetron 4 MG disintegrating tablet  Commonly known as:  ZOFRAN ODT  Take 1 tablet (4 mg total) by mouth every 8 (eight) hours as needed for nausea or vomiting.     ondansetron 4 MG tablet  Commonly known as:  ZOFRAN  Take 1 tablet (4 mg total) by mouth every 6 (six) hours.     pantoprazole 20 MG tablet  Commonly known as:  PROTONIX  Take 1 tablet (20 mg total) by mouth daily.           Objective:    BP 117/81 mmHg  Pulse 71  Temp(Src) 98.3 F (36.8 C) (Oral)  Ht  (1.651 m)  Wt 128 lb (58.06 kg)  BMI 21.30 kg/m2  LMP 01/11/2015 (Approximate)  Wt Readings from Last 3 Encounters:  01/31/15 128 lb (58.06 kg)  01/30/15 131 lb (59.421 kg)  01/25/15 128 lb 3.2 oz (58.151 kg)    Physical Exam  Constitutional: She is oriented to person, place, and time. She appears well-developed and well-nourished. No distress.  Eyes: Conjunctivae and EOM  are normal. Pupils are equal, round, and reactive to light.  Cardiovascular: Normal rate, regular rhythm, normal heart sounds and intact distal pulses.   No murmur heard. Pulmonary/Chest: Effort normal and breath sounds normal. No respiratory distress. She has no wheezes.  Abdominal: Soft. Bowel sounds are normal. She exhibits no distension and no mass. There is tenderness in the epigastric area and periumbilical area. There is no rebound, no guarding and no CVA tenderness.  Musculoskeletal: Normal range of motion. She exhibits no edema or tenderness.  Neurological: She is alert and oriented to person, place, and time. Coordination normal.  Skin: Skin is warm and dry. No rash noted. She is not diaphoretic.  Psychiatric: She has a normal mood and affect. Her behavior is normal.  Nursing note and vitals reviewed.   Results for orders placed or performed during the hospital encounter of 01/30/15  Lipase, blood  Result Value Ref Range   Lipase 34 11 - 51 U/L  Comprehensive metabolic panel  Result Value Ref Range   Sodium 143 135 - 145 mmol/L   Potassium 4.0 3.5 - 5.1 mmol/L   Chloride 107 101 - 111 mmol/L   CO2 25 22 - 32 mmol/L   Glucose, Bld 100 (H) 65 - 99 mg/dL   BUN 11 6 - 20 mg/dL   Creatinine, Ser 1.61 0.44 - 1.00 mg/dL   Calcium 9.5 8.9 - 09.6 mg/dL   Total Protein 7.8 6.5 - 8.1 g/dL   Albumin 4.7 3.5 - 5.0 g/dL   AST 16 15 - 41 U/L   ALT 13 (L) 14 - 54 U/L   Alkaline Phosphatase 36 (L) 38 - 126 U/L   Total Bilirubin 0.8 0.3 - 1.2 mg/dL   GFR calc non Af Amer >60 >60 mL/min   GFR calc Af Amer >60 >60 mL/min   Anion gap 11 5 - 15  CBC  Result Value Ref Range   WBC 7.9 4.0 - 10.5 K/uL   RBC 4.80 3.87 - 5.11 MIL/uL   Hemoglobin 14.3 12.0 - 15.0 g/dL   HCT 04.5 40.9 - 81.1 %   MCV 90.0 78.0 - 100.0 fL   MCH 29.8 26.0 - 34.0 pg   MCHC 33.1 30.0 - 36.0 g/dL   RDW 91.4 78.2 - 95.6 %   Platelets 396 150 - 400 K/uL  Urinalysis, Routine w reflex microscopic (not at [facility])    Result Value Ref Range   Color, Urine YELLOW YELLOW   APPearance CLEAR CLEAR   Specific Gravity, Urine 1.026 1.005 - 1.030   pH 7.5 5.0 - 8.0   Glucose, UA NEGATIVE NEGATIVE mg/dL   Hgb urine dipstick NEGATIVE NEGATIVE   Bilirubin Urine NEGATIVE NEGATIVE   Ketones, ur NEGATIVE NEGATIVE mg/dL   Protein, ur NEGATIVE NEGATIVE mg/dL   Nitrite NEGATIVE NEGATIVE   Leukocytes, UA NEGATIVE  NEGATIVE  hCG, quantitative, pregnancy  Result Value Ref Range   hCG, Beta Chain, Quant, S <1 <5 mIU/mL      Assessment & Plan:   Problem List Items Addressed This Visit    None    Visit Diagnoses    Peptic ulcer disease    -  Primary    pt admits to using a lot of nsaids, gi cocktail and continue protonix, instructed on watching for bleeding and to go to the hospital that occurs.    Relevant Medications    pantoprazole (PROTONIX) 20 MG tablet    ondansetron (ZOFRAN ODT) 4 MG disintegrating tablet    Other Relevant Orders    GI COCKTAIL UP TO 45 CC        Follow up plan: Return in about 4 weeks (around 02/28/2015), or if symptoms worsen or fail to improve, for f/u ulcer.  Counseling provided for all of the vaccine components Orders Placed This Encounter  Procedures  . GI COCKTAIL UP TO 45 CC    Arville Care, MD [facility] 01/31/2015, 2:49 PM

## 2015-02-28 ENCOUNTER — Ambulatory Visit (INDEPENDENT_AMBULATORY_CARE_PROVIDER_SITE_OTHER): Payer: BC Managed Care – PPO | Admitting: Family Medicine

## 2015-02-28 ENCOUNTER — Encounter: Payer: Self-pay | Admitting: Family Medicine

## 2015-02-28 VITALS — BP 110/80 | HR 74 | Temp 97.4°F | Ht 65.0 in | Wt 130.2 lb

## 2015-02-28 DIAGNOSIS — K2100 Gastro-esophageal reflux disease with esophagitis, without bleeding: Secondary | ICD-10-CM

## 2015-02-28 DIAGNOSIS — K219 Gastro-esophageal reflux disease without esophagitis: Secondary | ICD-10-CM | POA: Insufficient documentation

## 2015-02-28 NOTE — Progress Notes (Signed)
BP 110/80 mmHg  Pulse 74  Temp(Src) 97.4 F (36.3 C) (Oral)  Ht  (1.651 m)  Wt 130 lb 3.2 oz (59.058 kg)  BMI 21.67 kg/m2  LMP 02/07/2015   Subjective:    Patient ID: Tonya Lee, female    DOB: 10-15-1985, 30 y.o.   MRN: [id number]  HPI: Tonya Lee is a 30 y.o. female presenting on 02/28/2015 for Ulcer/abdominal pain   HPI Heartburn follow-up Patient is coming in today for her follow-up. She says she's been doing a lot better except for on the days when she eats too late or eats too much acidic foods. She denies any more nausea or vomiting or diarrhea. She is still taking the Protonix and doing well on it. She denies any blood in her stool.  Relevant past medical, surgical, family and social history reviewed and updated as indicated. Interim medical history since our last visit reviewed. Allergies and medications reviewed and updated.  Review of Systems  Constitutional: Negative for fever and chills.  HENT: Negative for congestion, ear discharge and ear pain.   Eyes: Negative for redness and visual disturbance.  Respiratory: Negative for chest tightness and shortness of breath.   Cardiovascular: Negative for chest pain and leg swelling.  Gastrointestinal: Positive for abdominal pain (almost completely resolved). Negative for nausea, vomiting, diarrhea, constipation, blood in stool and anal bleeding.  Genitourinary: Negative for dysuria and difficulty urinating.  Musculoskeletal: Negative for back pain and gait problem.  Skin: Negative for rash.  Neurological: Negative for light-headedness and headaches.  Psychiatric/Behavioral: Negative for behavioral problems and agitation.  All other systems reviewed and are negative.   Per HPI unless specifically indicated above     Medication List       This list is accurate as of: 02/28/15  8:47 AM.  Always use your most recent med list.               cetirizine 10 MG tablet  Commonly known as:  ZYRTEC  Take 10  mg by mouth daily as needed for allergies.     CRYSELLE-28 0.3-30 MG-MCG tablet  Generic drug:  norgestrel-ethinyl estradiol  Take 1 tablet by mouth daily.     ondansetron 4 MG disintegrating tablet  Commonly known as:  ZOFRAN ODT  Take 1 tablet (4 mg total) by mouth every 8 (eight) hours as needed for nausea or vomiting.     pantoprazole 20 MG tablet  Commonly known as:  PROTONIX  Take 1 tablet (20 mg total) by mouth daily.           Objective:    BP 110/80 mmHg  Pulse 74  Temp(Src) 97.4 F (36.3 C) (Oral)  Ht  (1.651 m)  Wt 130 lb 3.2 oz (59.058 kg)  BMI 21.67 kg/m2  LMP 02/07/2015  Wt Readings from Last 3 Encounters:  02/28/15 130 lb 3.2 oz (59.058 kg)  01/31/15 128 lb (58.06 kg)  01/30/15 131 lb (59.421 kg)    Physical Exam  Constitutional: She is oriented to person, place, and time. She appears well-developed and well-nourished. No distress.  Eyes: Conjunctivae and EOM are normal. Pupils are equal, round, and reactive to light.  Cardiovascular: Normal rate, regular rhythm, normal heart sounds and intact distal pulses.   No murmur heard. Pulmonary/Chest: Effort normal and breath sounds normal. No respiratory distress. She has no wheezes.  Abdominal: Soft. Bowel sounds are normal. She exhibits no distension. There is tenderness (Mild epigastric tenderness). There is no rebound and  no guarding.  Musculoskeletal: Normal range of motion. She exhibits no edema or tenderness.  Neurological: She is alert and oriented to person, place, and time. Coordination normal.  Skin: Skin is warm and dry. No rash noted. She is not diaphoretic.  Psychiatric: She has a normal mood and affect. Her behavior is normal.  Nursing note and vitals reviewed.   Results for orders placed or performed during the hospital encounter of 01/30/15  Lipase, blood  Result Value Ref Range   Lipase 34 11 - 51 U/L  Comprehensive metabolic panel  Result Value Ref Range   Sodium 143 135 - 145  mmol/L   Potassium 4.0 3.5 - 5.1 mmol/L   Chloride 107 101 - 111 mmol/L   CO2 25 22 - 32 mmol/L   Glucose, Bld 100 (H) 65 - 99 mg/dL   BUN 11 6 - 20 mg/dL   Creatinine, Ser 1.61 0.44 - 1.00 mg/dL   Calcium 9.5 8.9 - 09.6 mg/dL   Total Protein 7.8 6.5 - 8.1 g/dL   Albumin 4.7 3.5 - 5.0 g/dL   AST 16 15 - 41 U/L   ALT 13 (L) 14 - 54 U/L   Alkaline Phosphatase 36 (L) 38 - 126 U/L   Total Bilirubin 0.8 0.3 - 1.2 mg/dL   GFR calc non Af Amer >60 >60 mL/min   GFR calc Af Amer >60 >60 mL/min   Anion gap 11 5 - 15  CBC  Result Value Ref Range   WBC 7.9 4.0 - 10.5 K/uL   RBC 4.80 3.87 - 5.11 MIL/uL   Hemoglobin 14.3 12.0 - 15.0 g/dL   HCT 04.5 40.9 - 81.1 %   MCV 90.0 78.0 - 100.0 fL   MCH 29.8 26.0 - 34.0 pg   MCHC 33.1 30.0 - 36.0 g/dL   RDW 91.4 78.2 - 95.6 %   Platelets 396 150 - 400 K/uL  Urinalysis, Routine w reflex microscopic (not at [facility])  Result Value Ref Range   Color, Urine YELLOW YELLOW   APPearance CLEAR CLEAR   Specific Gravity, Urine 1.026 1.005 - 1.030   pH 7.5 5.0 - 8.0   Glucose, UA NEGATIVE NEGATIVE mg/dL   Hgb urine dipstick NEGATIVE NEGATIVE   Bilirubin Urine NEGATIVE NEGATIVE   Ketones, ur NEGATIVE NEGATIVE mg/dL   Protein, ur NEGATIVE NEGATIVE mg/dL   Nitrite NEGATIVE NEGATIVE   Leukocytes, UA NEGATIVE NEGATIVE  hCG, quantitative, pregnancy  Result Value Ref Range   hCG, Beta Chain, Quant, S <1 <5 mIU/mL      Assessment & Plan:   Problem List Items Addressed This Visit      Digestive   GERD (gastroesophageal reflux disease) - Primary    Continue Protonix and do yearly follow-up visits.          Follow up plan: Return in about 1 year (around 02/28/2016), or if symptoms worsen or fail to improve.  Counseling provided for all of the vaccine components No orders of the defined types were placed in this encounter.    Arville Care, MD [facility] 02/28/2015, 8:47 AM

## 2015-02-28 NOTE — Assessment & Plan Note (Signed)
Continue Protonix and do yearly follow-up visits.

## 2015-04-02 ENCOUNTER — Ambulatory Visit (INDEPENDENT_AMBULATORY_CARE_PROVIDER_SITE_OTHER): Payer: BC Managed Care – PPO | Admitting: Family Medicine

## 2015-04-02 ENCOUNTER — Encounter: Payer: Self-pay | Admitting: Family Medicine

## 2015-04-02 VITALS — BP 123/78 | HR 81 | Temp 98.1°F | Ht 65.0 in | Wt 129.2 lb

## 2015-04-02 DIAGNOSIS — J4531 Mild persistent asthma with (acute) exacerbation: Secondary | ICD-10-CM | POA: Diagnosis not present

## 2015-04-02 MED ORDER — ALBUTEROL SULFATE HFA 108 (90 BASE) MCG/ACT IN AERS: 2.0000 | INHALATION_SPRAY | Freq: Four times a day (QID) | RESPIRATORY_TRACT | Status: DC | PRN

## 2015-04-02 MED ORDER — FLUTICASONE FUROATE-VILANTEROL 100-25 MCG/INH IN AEPB: 1.0000 | INHALATION_SPRAY | Freq: Every day | RESPIRATORY_TRACT | Status: DC

## 2015-04-02 NOTE — Progress Notes (Signed)
BP 123/78 mmHg  Pulse 81  Temp(Src) 98.1 F (36.7 C) (Oral)  Ht  (1.651 m)  Wt 129 lb 3.2 oz (58.605 kg)  BMI 21.50 kg/m2   Subjective:    Patient ID: Tonya Lee, female    DOB: 07/12/1985, 30 y.o.   MRN: [id number]  HPI: Silvia Hartley is a 30 y.o. female presenting on 04/02/2015 for Wheezing at night and Cough   HPI Shortness of breath and wheezing Patient is currently remodeling her home and while there are mottling is happening she is staying with her parents. Since she has been staying with her parents she has had increasing shortness of breath and wheezing that is worse at night to where she has had to use her grandmother's albuterol inhaler to help with the wheezing. She had asthma as a child but then grew out of it and only had exercise-induced asthma and did not need an inhaler for quite a few years. Since being at her parents house where they both have dogs and it being an older house with carpet and issues like mold she has had a lot more problems with her breathing. When she goes out in the day and at her work and outside she is doing a lot better.  Relevant past medical, surgical, family and social history reviewed and updated as indicated. Interim medical history since our last visit reviewed. Allergies and medications reviewed and updated.  Review of Systems  Constitutional: Negative for fever and chills.  HENT: Positive for postnasal drip, rhinorrhea, sinus pressure, sneezing and sore throat. Negative for congestion, ear discharge and ear pain.   Eyes: Negative for pain, redness and visual disturbance.  Respiratory: Positive for cough, shortness of breath and wheezing. Negative for chest tightness.   Cardiovascular: Negative for chest pain and leg swelling.  Genitourinary: Negative for dysuria and difficulty urinating.  Musculoskeletal: Negative for back pain and gait problem.  Skin: Negative for rash.  Neurological: Negative for light-headedness and  headaches.  Psychiatric/Behavioral: Negative for behavioral problems and agitation.  All other systems reviewed and are negative.   Per HPI unless specifically indicated above     Medication List       This list is accurate as of: 04/02/15  7:02 PM.  Always use your most recent med list.               albuterol 108 (90 Base) MCG/ACT inhaler  Commonly known as:  PROVENTIL HFA;VENTOLIN HFA  Inhale 2 puffs into the lungs every 6 (six) hours as needed for wheezing or shortness of breath.     cetirizine 10 MG tablet  Commonly known as:  ZYRTEC  Take 10 mg by mouth daily as needed for allergies.     CRYSELLE-28 0.3-30 MG-MCG tablet  Generic drug:  norgestrel-ethinyl estradiol  Take 1 tablet by mouth daily.     fluticasone furoate-vilanterol 100-25 MCG/INH Aepb  Commonly known as:  BREO ELLIPTA  Inhale 1 puff into the lungs daily.     pantoprazole 20 MG tablet  Commonly known as:  PROTONIX  Take 1 tablet (20 mg total) by mouth daily.           Objective:    BP 123/78 mmHg  Pulse 81  Temp(Src) 98.1 F (36.7 C) (Oral)  Ht  (1.651 m)  Wt 129 lb 3.2 oz (58.605 kg)  BMI 21.50 kg/m2  Wt Readings from Last 3 Encounters:  04/02/15 129 lb 3.2 oz (58.605 kg)  02/28/15 130 lb  3.2 oz (59.058 kg)  01/31/15 128 lb (58.06 kg)    Physical Exam  Constitutional: She is oriented to person, place, and time. She appears well-developed and well-nourished. No distress.  HENT:  Right Ear: Tympanic membrane, external ear and ear canal normal.  Left Ear: Tympanic membrane, external ear and ear canal normal.  Nose: Mucosal edema and rhinorrhea present. No epistaxis. Right sinus exhibits no maxillary sinus tenderness and no frontal sinus tenderness. Left sinus exhibits no maxillary sinus tenderness and no frontal sinus tenderness.  Mouth/Throat: Uvula is midline and mucous membranes are normal. Posterior oropharyngeal edema present. No oropharyngeal exudate, posterior oropharyngeal  erythema or tonsillar abscesses.  Eyes: Conjunctivae and EOM are normal.  Neck: Neck supple. No thyromegaly present.  Cardiovascular: Normal rate, regular rhythm, normal heart sounds and intact distal pulses.   No murmur heard. Pulmonary/Chest: Effort normal. No respiratory distress. She has no wheezes (No wheezes on exam currently). She has no rales. She exhibits no tenderness.  Musculoskeletal: Normal range of motion. She exhibits no edema or tenderness.  Lymphadenopathy:    She has no cervical adenopathy.  Neurological: She is alert and oriented to person, place, and time. Coordination normal.  Skin: Skin is warm and dry. No rash noted. She is not diaphoretic.  Psychiatric: She has a normal mood and affect. Her behavior is normal.  Vitals reviewed.     Assessment & Plan:   Problem List Items Addressed This Visit    None    Visit Diagnoses    Asthma, mild persistent, with acute exacerbation    -  Primary    Living with parents, older house, has exacerbation, maintenance inhaler and albuterol and Flonase    Relevant Medications    albuterol (PROVENTIL HFA;VENTOLIN HFA) 108 (90 Base) MCG/ACT inhaler    fluticasone furoate-vilanterol (BREO ELLIPTA) 100-25 MCG/INH AEPB        Follow up plan: Return in about 4 weeks (around 04/30/2015), or if symptoms worsen or fail to improve, for Asthma recheck.  Counseling provided for all of the vaccine components No orders of the defined types were placed in this encounter.    Arville CareJoshua Dettinger, MD [facility] 04/02/2015, 7:02 PM

## 2015-05-05 ENCOUNTER — Ambulatory Visit (INDEPENDENT_AMBULATORY_CARE_PROVIDER_SITE_OTHER): Payer: BC Managed Care – PPO | Admitting: Family Medicine

## 2015-05-05 ENCOUNTER — Encounter: Payer: Self-pay | Admitting: Family Medicine

## 2015-05-05 VITALS — BP 126/89 | HR 81 | Temp 97.8°F | Ht 65.0 in | Wt 129.2 lb

## 2015-05-05 DIAGNOSIS — J4541 Moderate persistent asthma with (acute) exacerbation: Secondary | ICD-10-CM | POA: Diagnosis not present

## 2015-05-05 MED ORDER — AZITHROMYCIN 250 MG PO TABS: ORAL_TABLET | ORAL | Status: DC

## 2015-05-05 MED ORDER — METHYLPREDNISOLONE ACETATE 80 MG/ML IJ SUSP
80.0000 mg | Freq: Once | INTRAMUSCULAR | Status: AC
  Administered 2015-05-05: 80 mg via INTRAMUSCULAR

## 2015-05-05 NOTE — Progress Notes (Signed)
BP 126/89 mmHg  Pulse 81  Temp(Src) 97.8 F (36.6 C) (Oral)  Ht 5\' 5"  (1.651 m)  Wt 129 lb 3.2 oz (58.605 kg)  BMI 21.50 kg/m2  LMP 04/14/2015   Subjective:    Patient ID: Tonya Lee, female    DOB: 01-Jul-1985, 30 y.o.   MRN: [id number]  HPI: Tonya Lee is a 30 y.o. female presenting on 05/05/2015 for Bilateral ear pain and Sinusitis   HPI Ear pain and sinus congestion and wheezing and asthma Patient comes in today with ear pain and sinus congestion and wheezing and asthma that's been worse over the past few days. She is still living with her parents where there are dogs and cats and she's been having a lot more issues with her allergies and her asthma since she has been living there. She does a lot better when she works long days and is out of the house for prolonged periods. Her nights have been very rough for her. She denies any fevers or chills. She denies any shortness of breath. Her right ear has more pressure than her left.   Relevant past medical, surgical, family and social history reviewed and updated as indicated. Interim medical history since our last visit reviewed. Allergies and medications reviewed and updated.  Review of Systems  Constitutional: Negative for fever and chills.  HENT: Positive for congestion, ear pain, postnasal drip, rhinorrhea, sinus pressure, sneezing and sore throat. Negative for ear discharge.   Eyes: Negative for pain, redness and visual disturbance.  Respiratory: Positive for cough. Negative for chest tightness and shortness of breath.   Cardiovascular: Negative for chest pain and leg swelling.  Genitourinary: Negative for dysuria and difficulty urinating.  Musculoskeletal: Negative for back pain and gait problem.  Skin: Negative for rash.  Neurological: Negative for light-headedness and headaches.  Psychiatric/Behavioral: Negative for behavioral problems and agitation.  All other systems reviewed and are negative.   Per HPI  unless specifically indicated above     Medication List       This list is accurate as of: 05/05/15  4:09 PM.  Always use your most recent med list.               albuterol 108 (90 Base) MCG/ACT inhaler  Commonly known as:  PROVENTIL HFA;VENTOLIN HFA  Inhale 2 puffs into the lungs every 6 (six) hours as needed for wheezing or shortness of breath.     azithromycin 250 MG tablet  Commonly known as:  ZITHROMAX  Take 2 the first day and then one each day after.     cetirizine 10 MG tablet  Commonly known as:  ZYRTEC  Take 10 mg by mouth daily as needed for allergies.     CRYSELLE-28 0.3-30 MG-MCG tablet  Generic drug:  norgestrel-ethinyl estradiol  Take 1 tablet by mouth daily.     fluticasone 50 MCG/ACT nasal spray  Commonly known as:  FLONASE  Place into both nostrils daily.     fluticasone furoate-vilanterol 100-25 MCG/INH Aepb  Commonly known as:  BREO ELLIPTA  Inhale 1 puff into the lungs daily.     pantoprazole 20 MG tablet  Commonly known as:  PROTONIX  Take 1 tablet (20 mg total) by mouth daily.           Objective:    BP 126/89 mmHg  Pulse 81  Temp(Src) 97.8 F (36.6 C) (Oral)  Ht 5\' 5"  (1.651 m)  Wt 129 lb 3.2 oz (58.605 kg)  BMI 21.50 kg/m2  LMP 04/14/2015  Wt Readings from Last 3 Encounters:  05/05/15 129 lb 3.2 oz (58.605 kg)  04/02/15 129 lb 3.2 oz (58.605 kg)  02/28/15 130 lb 3.2 oz (59.058 kg)    Physical Exam  Constitutional: She is oriented to person, place, and time. She appears well-developed and well-nourished. No distress.  HENT:  Right Ear: External ear and ear canal normal. No mastoid tenderness. Tympanic membrane is bulging. Tympanic membrane is not perforated and not erythematous. No hemotympanum.  Left Ear: External ear and ear canal normal. No mastoid tenderness. Tympanic membrane is bulging. Tympanic membrane is not perforated and not erythematous. No hemotympanum.  Nose: Mucosal edema and rhinorrhea present. No epistaxis. Right  sinus exhibits frontal sinus tenderness. Right sinus exhibits no maxillary sinus tenderness. Left sinus exhibits frontal sinus tenderness. Left sinus exhibits no maxillary sinus tenderness.  Mouth/Throat: Uvula is midline and mucous membranes are normal. Posterior oropharyngeal edema and posterior oropharyngeal erythema present. No oropharyngeal exudate or tonsillar abscesses.  Eyes: Conjunctivae and EOM are normal.  Neck: Neck supple. No thyromegaly present.  Cardiovascular: Normal rate, regular rhythm, normal heart sounds and intact distal pulses.   No murmur heard. Pulmonary/Chest: Effort normal and breath sounds normal. No respiratory distress. She has no wheezes. She has no rales.  Musculoskeletal: Normal range of motion. She exhibits no edema or tenderness.  Lymphadenopathy:    She has no cervical adenopathy.  Neurological: She is alert and oriented to person, place, and time. Coordination normal.  Skin: Skin is warm and dry. No rash noted. She is not diaphoretic.  Psychiatric: She has a normal mood and affect. Her behavior is normal.  Nursing note and vitals reviewed.     Assessment & Plan:   Problem List Items Addressed This Visit    None    Visit Diagnoses    Asthma with acute exacerbation, moderate persistent    -  Primary    She is moderate assistance and she is living at her parent's house with dogs. Will give steroid shot and azithromycin    Relevant Medications    azithromycin (ZITHROMAX) 250 MG tablet    methylPREDNISolone acetate (DEPO-MEDROL) injection 80 mg (Start on 05/05/2015  4:15 PM)       Follow up plan: Return if symptoms worsen or fail to improve.  Counseling provided for all of the vaccine components No orders of the defined types were placed in this encounter.    Arville Care, MD [facility] 05/05/2015, 4:09 PM

## 2015-05-12 ENCOUNTER — Telehealth: Payer: Self-pay | Admitting: Family Medicine

## 2015-05-12 MED ORDER — AMOXICILLIN-POT CLAVULANATE 875-125 MG PO TABS: 1.0000 | ORAL_TABLET | Freq: Two times a day (BID) | ORAL | Status: DC

## 2015-05-12 NOTE — Telephone Encounter (Signed)
Augmentin Prescription sent to pharmacy. Pt will need to be seen if she is not better.

## 2015-05-13 NOTE — Telephone Encounter (Signed)
Detailed message left for patient.

## 2015-08-11 ENCOUNTER — Encounter: Payer: Self-pay | Admitting: Family Medicine

## 2015-08-11 ENCOUNTER — Ambulatory Visit (INDEPENDENT_AMBULATORY_CARE_PROVIDER_SITE_OTHER): Payer: BC Managed Care – PPO

## 2015-08-11 ENCOUNTER — Telehealth: Payer: Self-pay | Admitting: Family Medicine

## 2015-08-11 ENCOUNTER — Ambulatory Visit (INDEPENDENT_AMBULATORY_CARE_PROVIDER_SITE_OTHER): Payer: BC Managed Care – PPO | Admitting: Family Medicine

## 2015-08-11 VITALS — BP 112/78 | HR 76 | Temp 97.1°F | Ht 65.0 in | Wt 130.0 lb

## 2015-08-11 DIAGNOSIS — S39012A Strain of muscle, fascia and tendon of lower back, initial encounter: Secondary | ICD-10-CM

## 2015-08-11 MED ORDER — METHYLPREDNISOLONE ACETATE 80 MG/ML IJ SUSP
80.0000 mg | Freq: Once | INTRAMUSCULAR | Status: AC
Start: 2015-08-11 — End: 2015-08-11
  Administered 2015-08-11: 80 mg via INTRAMUSCULAR

## 2015-08-11 NOTE — Progress Notes (Signed)
BP 112/78 (BP Location: Left Arm, Patient Position: Sitting, Cuff Size: Normal)   Pulse 76   Temp 97.1 F (36.2 C) (Oral)   Ht  (1.651 m)   Wt 130 lb (59 kg)   LMP 07/28/2015 (Approximate)   BMI 21.63 kg/m    Subjective:    Patient ID: Tonya Lee, female    DOB: 1985/10/30, 30 y.o.   MRN: [id number]  HPI: Tonya Lee is a 30 y.o. female presenting on 08/11/2015 for Back Pain (pain in lower back, began  2 days ago while picking up dog, had excruciating pain that brought her to her knees)   HPI Low back pain Patient comes in today with complaints of significant left lower back pain just lateral to the midline extending down into her buttock on that side. She says that it started 2 days ago when she was trying to pick up her large dog and felt a sharp shooting pain at that time and since that time she has had very intense pain on that side. She even has some tightness going down into her hamstring. But she denies any shooting pain going down into her leg. She denies any fevers or chills. She denies any abdominal complaints. She has been using ibuprofen over the past 2 days in significant doses to try and counteract this pain. She denies any weakness in either leg as well. Since that time she has had trouble straightening up completely or laying flat on her back. She also has difficulty with prolonged standing or prolonged laying down.  Relevant past medical, surgical, family and social history reviewed and updated as indicated. Interim medical history since our last visit reviewed. Allergies and medications reviewed and updated.  Review of Systems  Constitutional: Negative for chills and fever.  HENT: Negative for congestion, ear discharge and ear pain.   Eyes: Negative for redness and visual disturbance.  Respiratory: Negative for chest tightness and shortness of breath.   Cardiovascular: Negative for chest pain and leg swelling.  Genitourinary: Negative for difficulty  urinating and dysuria.  Musculoskeletal: Positive for back pain and myalgias. Negative for gait problem.  Skin: Negative for rash.  Neurological: Negative for light-headedness and headaches.  Psychiatric/Behavioral: Negative for agitation and behavioral problems.  All other systems reviewed and are negative.   Per HPI unless specifically indicated above     Medication List       Accurate as of 08/11/15 11:20 AM. Always use your most recent med list.          albuterol 108 (90 Base) MCG/ACT inhaler Commonly known as:  PROVENTIL HFA;VENTOLIN HFA Inhale 2 puffs into the lungs every 6 (six) hours as needed for wheezing or shortness of breath.   cetirizine 10 MG tablet Commonly known as:  ZYRTEC Take 10 mg by mouth daily as needed for allergies.   CRYSELLE-28 0.3-30 MG-MCG tablet Generic drug:  norgestrel-ethinyl estradiol Take 1 tablet by mouth daily.   fluticasone 50 MCG/ACT nasal spray Commonly known as:  FLONASE Place into both nostrils daily.   fluticasone furoate-vilanterol 100-25 MCG/INH Aepb Commonly known as:  BREO ELLIPTA Inhale 1 puff into the lungs daily.   pantoprazole 20 MG tablet Commonly known as:  PROTONIX Take 1 tablet (20 mg total) by mouth daily.          Objective:    BP 112/78 (BP Location: Left Arm, Patient Position: Sitting, Cuff Size: Normal)   Pulse 76   Temp 97.1 F (36.2 C) (Oral)  Ht 5\' 5"  (1.651 m)   Wt 130 lb (59 kg)   LMP 07/28/2015 (Approximate)   BMI 21.63 kg/m   Wt Readings from Last 3 Encounters:  08/11/15 130 lb (59 kg)  05/05/15 129 lb 3.2 oz (58.6 kg)  04/02/15 129 lb 3.2 oz (58.6 kg)    Physical Exam  Constitutional: She is oriented to person, place, and time. She appears well-developed and well-nourished. No distress.  Eyes: Conjunctivae and EOM are normal. Pupils are equal, round, and reactive to light.  Cardiovascular: Normal rate, regular rhythm, normal heart sounds and intact distal pulses.   No murmur  heard. Pulmonary/Chest: Effort normal and breath sounds normal. No respiratory distress. She has no wheezes.  Musculoskeletal: Normal range of motion. She exhibits no edema.       Lumbar back: She exhibits tenderness. She exhibits normal range of motion, no bony tenderness, no swelling, no laceration and normal pulse.       Back:  Neurological: She is alert and oriented to person, place, and time. Coordination normal.  Skin: Skin is warm and dry. No rash noted. She is not diaphoretic.  Psychiatric: She has a normal mood and affect. Her behavior is normal.  Nursing note and vitals reviewed.   Lumbar spine x-ray: No acute bony abnormalities noted.    Assessment & Plan:   Problem List Items Addressed This Visit    None    Visit Diagnoses    Lumbar strain, initial encounter    -  Primary   Relevant Medications   methylPREDNISolone acetate (DEPO-MEDROL) injection 80 mg (Completed)   Other Relevant Orders   DG Lumbar Spine 2-3 Views (Completed)       Follow up plan: Return in about 1 year (around 08/10/2016), or if symptoms worsen or fail to improve.  Counseling provided for all of the vaccine components Orders Placed This Encounter  Procedures  . DG Lumbar Spine 2-3 Views    Arville Care, MD [facility] 08/11/2015, 11:20 AM

## 2015-08-11 NOTE — Telephone Encounter (Signed)
Detailed message left for patient with results

## 2015-08-15 ENCOUNTER — Other Ambulatory Visit: Payer: Self-pay | Admitting: *Deleted

## 2015-08-15 ENCOUNTER — Telehealth: Payer: Self-pay | Admitting: Family Medicine

## 2015-08-15 DIAGNOSIS — S39012D Strain of muscle, fascia and tendon of lower back, subsequent encounter: Secondary | ICD-10-CM

## 2015-08-15 NOTE — Telephone Encounter (Signed)
Yes go ahead and do referral to [facility] orthopedic and she can see Dr. Ethelene Hal

## 2015-08-15 NOTE — Telephone Encounter (Signed)
Please address

## 2015-08-15 NOTE — Telephone Encounter (Signed)
Pt aware referral will be placed and discussed imaging results.

## 2015-09-02 ENCOUNTER — Telehealth: Payer: Self-pay

## 2015-09-02 NOTE — Telephone Encounter (Signed)
Patient states that she saw us here for back pain and was referred to Dr Ethelene Halamos and he gave her a prednisone dose pack starting last week. She took it for a couple days and began to feel awful. She immediately stopped the prednisone but has continued to feel bad in the days to follow. Patient also advised that she had a call in to Dr Ethelene Halamos office and was awaiting a response. Advised with prednisone that it may take some time to get out of her system but to follow directions given by Dr Ethelene Halamos' office when they return her call. She is not SOB or have any rash. Just feels bad. Patient verbalized understanding

## 2015-09-03 ENCOUNTER — Ambulatory Visit (INDEPENDENT_AMBULATORY_CARE_PROVIDER_SITE_OTHER): Payer: BC Managed Care – PPO | Admitting: Family Medicine

## 2015-09-03 ENCOUNTER — Encounter: Payer: Self-pay | Admitting: Family Medicine

## 2015-09-03 VITALS — BP 118/86 | HR 79 | Temp 98.0°F | Ht 65.0 in | Wt 130.4 lb

## 2015-09-03 DIAGNOSIS — G43019 Migraine without aura, intractable, without status migrainosus: Secondary | ICD-10-CM

## 2015-09-03 MED ORDER — RIZATRIPTAN BENZOATE 5 MG PO TABS: 5.0000 mg | ORAL_TABLET | ORAL | 0 refills | Status: DC | PRN

## 2015-09-03 MED ORDER — KETOROLAC TROMETHAMINE 60 MG/2ML IM SOLN
60.0000 mg | Freq: Once | INTRAMUSCULAR | Status: AC
  Administered 2015-09-03: 60 mg via INTRAMUSCULAR

## 2015-09-03 NOTE — Progress Notes (Signed)
BP 118/86 (BP Location: Left Arm, Patient Position: Sitting, Cuff Size: Normal)   Pulse 79   Temp 98 F (36.7 C) (Oral)   Ht 5\' 5"  (1.651 m)   Wt 130 lb 6.4 oz (59.1 kg)   LMP 08/29/2015   BMI 21.70 kg/m    Subjective:    Patient ID: Tonya Lee, female    DOB: 1985-01-14, 30 y.o.   MRN: [id number]  HPI: Tonya Lee is a 30 y.o. female presenting on 09/03/2015 for Severe headache, ringing in ears (patient thinks she may be reacting to Prednisone.  Started prednisone 6 days ago, given by Dr. Colbert Ewingamons, that evening she experienced severe pain and echoing in her left ear, followed by headache, pain and soreness in her neck, upper back and chest.  )   HPI Headache and ringing in her ears Patient started prednisone 6 days ago and on day 2 started having headaches and ringing in her ears, did not take her blood pressure that points she does not know if it was high or not. She tapered down over the next couple days and stopped it 4 days ago. Since stopping it she has continued to have headaches and green or ears and tightness going down her back in the muscles. She denies any fevers or chills or cough or congestion or runny nose. She denies any urinary symptoms or vaginal symptoms or shortness of breath or wheezing. She does feel tight in the back of her neck. She denies any aura and the headaches are mostly frontal 2 on top of her head. The headaches feel like a pulsating and pounding sensation. She says they have been debilitating and she has just had to sleep most of the time when she is not at work. The headaches do not occur while she is sleeping  Relevant past medical, surgical, family and social history reviewed and updated as indicated. Interim medical history since our last visit reviewed. Allergies and medications reviewed and updated.  Review of Systems  Constitutional: Negative for chills and fever.  HENT: Negative for congestion, ear discharge and ear pain.   Eyes: Negative  for redness and visual disturbance.  Respiratory: Negative for chest tightness and shortness of breath.   Cardiovascular: Negative for chest pain and leg swelling.  Genitourinary: Negative for difficulty urinating and dysuria.  Musculoskeletal: Negative for back pain and gait problem.  Skin: Negative for rash.  Neurological: Positive for headaches. Negative for dizziness, tremors, syncope, light-headedness and numbness.  Psychiatric/Behavioral: Negative for agitation and behavioral problems.  All other systems reviewed and are negative.   Per HPI unless specifically indicated above     Medication List       Accurate as of 09/03/15  3:50 PM. Always use your most recent med list.          albuterol 108 (90 Base) MCG/ACT inhaler Commonly known as:  PROVENTIL HFA;VENTOLIN HFA Inhale 2 puffs into the lungs every 6 (six) hours as needed for wheezing or shortness of breath.   CRYSELLE-28 0.3-30 MG-MCG tablet Generic drug:  norgestrel-ethinyl estradiol Take 1 tablet by mouth daily.   fluticasone furoate-vilanterol 100-25 MCG/INH Aepb Commonly known as:  BREO ELLIPTA Inhale 1 puff into the lungs daily.   pantoprazole 20 MG tablet Commonly known as:  PROTONIX Take 1 tablet (20 mg total) by mouth daily.   rizatriptan 5 MG tablet Commonly known as:  MAXALT Take 1 tablet (5 mg total) by mouth as needed for migraine. May repeat in 2 hours  if needed          Objective:    BP 118/86 (BP Location: Left Arm, Patient Position: Sitting, Cuff Size: Normal)   Pulse 79   Temp 98 F (36.7 C) (Oral)   Ht 5\' 5"  (1.651 m)   Wt 130 lb 6.4 oz (59.1 kg)   LMP 08/29/2015   BMI 21.70 kg/m   Wt Readings from Last 3 Encounters:  09/03/15 130 lb 6.4 oz (59.1 kg)  08/11/15 130 lb (59 kg)  05/05/15 129 lb 3.2 oz (58.6 kg)    Physical Exam  Constitutional: She is oriented to person, place, and time. She appears well-developed and well-nourished. No distress.  Eyes: Conjunctivae and EOM are  normal. Pupils are equal, round, and reactive to light.  Cardiovascular: Normal rate, regular rhythm, normal heart sounds and intact distal pulses.   No murmur heard. Pulmonary/Chest: Effort normal and breath sounds normal. No respiratory distress. She has no wheezes.  Musculoskeletal: Normal range of motion. She exhibits no edema or tenderness.  Neurological: She is alert and oriented to person, place, and time. She displays normal reflexes. No cranial nerve deficit. She exhibits normal muscle tone. Coordination normal.  Negative Kernig sign  Skin: Skin is warm and dry. No rash noted. She is not diaphoretic.  Psychiatric: She has a normal mood and affect. Her behavior is normal.  Nursing note and vitals reviewed.     Assessment & Plan:   Problem List Items Addressed This Visit    None    Visit Diagnoses    Intractable migraine without aura and without status migrainosus    -  Primary   Relevant Medications   ketorolac (TORADOL) injection 60 mg (Start on 09/03/2015  4:00 PM)   rizatriptan (MAXALT) 5 MG tablet       Follow up plan: Return if symptoms worsen or fail to improve.  Counseling provided for all of the vaccine components No orders of the defined types were placed in this encounter.   Arville CareJoshua Chicquita Mendel, MD [facility] 09/03/2015, 3:50 PM

## 2015-11-07 ENCOUNTER — Encounter: Payer: Self-pay | Admitting: Family Medicine

## 2015-11-07 ENCOUNTER — Ambulatory Visit (INDEPENDENT_AMBULATORY_CARE_PROVIDER_SITE_OTHER): Payer: BC Managed Care – PPO | Admitting: Family Medicine

## 2015-11-07 VITALS — BP 117/80 | HR 69 | Temp 98.4°F | Ht 65.0 in | Wt 134.4 lb

## 2015-11-07 DIAGNOSIS — G43811 Other migraine, intractable, with status migrainosus: Secondary | ICD-10-CM

## 2015-11-07 DIAGNOSIS — G43019 Migraine without aura, intractable, without status migrainosus: Secondary | ICD-10-CM

## 2015-11-07 MED ORDER — METHYLPREDNISOLONE ACETATE 80 MG/ML IJ SUSP: 80.0000 mg | Freq: Once | INTRAMUSCULAR | Status: DC

## 2015-11-07 MED ORDER — BETAMETHASONE SOD PHOS & ACET 6 (3-3) MG/ML IJ SUSP
12.0000 mg | Freq: Once | INTRAMUSCULAR | Status: AC
  Administered 2015-11-07: 12 mg via INTRAMUSCULAR

## 2015-11-07 MED ORDER — KETOROLAC TROMETHAMINE 60 MG/2ML IM SOLN
60.0000 mg | Freq: Once | INTRAMUSCULAR | Status: AC
  Administered 2015-11-07: 60 mg via INTRAMUSCULAR

## 2015-11-07 MED ORDER — FROVATRIPTAN SUCCINATE 2.5 MG PO TABS: 2.5000 mg | ORAL_TABLET | ORAL | 2 refills | Status: DC | PRN

## 2015-11-07 NOTE — Progress Notes (Signed)
Subjective:  Patient ID: Tonya Lee, female    DOB: Aug 06, 1985  Age: 30 y.o. MRN: [id number]  CC: Migraine (pt here today c/o migraine that has been going on for 8 days. She took the Maxalt for the 1st 2 days without relief )   HPI Tonya Lee presents for Patient has had a headache rated 7/10 for about 8 days. She took Maxalt 2 hours apart on the first day. No relief occurred. She tried it again on the next day still no relief. The last headache prior to that was 2 months ago. She was seen by Dr. Louanne Skye who prescribed Maxalt and gave her a shot that relieved the headache. She had one after that time that was relieved by Maxalt. 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 She eventually ended up on Topamax after several prophylactic and abortive treatments were tried. During her college years and the symptoms seem to decrease. Therefore eventually she was able to discontinue the Topamax. Recently she has had a great deal more stress. She feels this has served as a trigger for her stress. 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 She had a family emergency just prior to the beginning of the headache as well. 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   She has a past surgical history that includes Ankle surgery (Right, july 2013) and Fasciectomy (Right).   Her family history includes Asthma in her maternal grandmother and mother; Cancer in her maternal grandfather; Diabetes in her maternal grandfather; Heart disease in her maternal grandfather and paternal grandfather; Hyperlipidemia in her maternal grandmother; Hypertension in her maternal grandmother.She reports that she has never smoked. She has never used smokeless tobacco. She reports that she does not drink alcohol or use drugs.    ROS Review of Systems  Constitutional: Negative for activity  change, appetite change and fever.  HENT: Negative for congestion, rhinorrhea and sore throat.   Eyes: Negative for visual disturbance.  Respiratory: Negative for cough and shortness of breath.   Cardiovascular: Negative for chest pain and palpitations.  Gastrointestinal: Negative for abdominal pain, diarrhea and nausea.  Genitourinary: Negative for dysuria.  Musculoskeletal: Positive for myalgias. Negative for arthralgias.  Neurological: Positive for headaches. Negative for dizziness, tremors, seizures, speech difficulty, weakness and light-headedness.    Objective:  BP 117/80   Pulse 69   Temp 98.4 F (36.9 C) (Oral)   Ht 5\' 5"  (1.651 m)   Wt 134 lb 6 oz (61 kg)   LMP 10/24/2015 (Approximate)   BMI 22.36 kg/m   BP Readings from Last 3 Encounters:  11/07/15 117/80  09/03/15 118/86  08/11/15 112/78    Wt Readings from Last 3 Encounters:  11/07/15 134 lb 6 oz (61 kg)  09/03/15 130 lb 6.4 oz (59.1 kg)  08/11/15 130 lb (59 kg)     Physical Exam  Constitutional: She is oriented to person, place, and time. She appears well-developed and well-nourished. No distress.  HENT:  Head: Normocephalic and atraumatic.  Right Ear: External ear normal.  Left Ear: External ear normal.  Nose: Nose normal.  Mouth/Throat: Oropharynx is clear and moist.  Eyes: Conjunctivae and EOM are normal. Pupils are equal, round, and reactive to light.  Neck: Normal range of motion. Neck supple. No thyromegaly present.  Cardiovascular: Normal rate, regular rhythm and normal heart sounds.   No  murmur heard. Pulmonary/Chest: Effort normal and breath sounds normal. No respiratory distress. She has no wheezes. She has no rales.  Abdominal: Soft. Bowel sounds are normal. She exhibits no distension. There is no tenderness.  Lymphadenopathy:    She has no cervical adenopathy.  Neurological: She is alert and oriented to person, place, and time. She has normal reflexes.  Skin: Skin is warm and dry.    Psychiatric: She has a normal mood and affect. Her behavior is normal. Judgment and thought content normal.     Lab Results  Component Value Date   WBC 7.9 01/30/2015   HGB 14.3 01/30/2015   HCT 43.2 01/30/2015   PLT 396 01/30/2015   GLUCOSE 100 (H) 01/30/2015   ALT 13 (L) 01/30/2015   AST 16 01/30/2015   NA 143 01/30/2015   K 4.0 01/30/2015   CL 107 01/30/2015   CREATININE 0.81 01/30/2015   BUN 11 01/30/2015   CO2 25 01/30/2015    Ct Abdomen Pelvis W Contrast  Result Date: 01/30/2015 CLINICAL DATA:  Periumbilical abdominal pain wall worse today. Recently diagnosed with a urinary tract infection. Nausea. EXAM: CT ABDOMEN AND PELVIS WITH CONTRAST TECHNIQUE: Multidetector CT imaging of the abdomen and pelvis was performed using the standard protocol following bolus administration of intravenous contrast. CONTRAST:  OMNIPAQUE IOHEXOL 300 MG/ML  SOLN COMPARISON:  None. FINDINGS: The visualized lung bases are clear. There is minimal periportal edema. A subcentimeter low-density lesion in the inferior right hepatic lobe is too small to fully characterize. The gallbladder, spleen, adrenal glands, left kidney, and pancreas are unremarkable. There is a 3 mm nonobstructing calculus in the interpolar right kidney. Oral contrast is present in multiple nondilated loops of small bowel. There is no evidence of bowel obstruction or inflammation. The appendix is identified in the right lower quadrant and is unremarkable. The bladder is mildly distended and unremarkable. The uterus and ovaries are grossly unremarkable. No free fluid or enlarged lymph nodes are identified. No abdominal wall hernia is seen. No acute osseous abnormality is identified. IMPRESSION: 1. Punctate, nonobstructing right renal calculus. 2. Trace periportal edema, nonspecific. Electronically Signed   By: Sebastian Ache M.D.   On: 01/30/2015 14:57    Assessment & Plan:   Kym was seen today for migraine.  Diagnoses and all  orders for this visit:  Other migraine with status migrainosus, intractable -     Discontinue: methylPREDNISolone acetate (DEPO-MEDROL) injection 80 mg; Inject 1 mL (80 mg total) into the muscle once. -     ketorolac (TORADOL) injection 60 mg; Inject 2 mLs (60 mg total) into the muscle once. -     betamethasone acetate-betamethasone sodium phosphate (CELESTONE) injection 12 mg; Inject 2 mLs (12 mg total) into the muscle once. -     betamethasone acetate-betamethasone sodium phosphate (CELESTONE) injection 6 mg; Inject 1 mL (6 mg total) into the muscle once. -     ketorolac (TORADOL) injection 60 mg; Inject 2 mLs (60 mg total) into the muscle once.  Intractable migraine without aura and without status migrainosus -     betamethasone acetate-betamethasone sodium phosphate (CELESTONE) injection 6 mg; Inject 1 mL (6 mg total) into the muscle once. -     ketorolac (TORADOL) injection 60 mg; Inject 2 mLs (60 mg total) into the muscle once.  Other orders -     frovatriptan (FROVA) 2.5 MG tablet; Take 1 tablet (2.5 mg total) by mouth as needed for migraine. If recurs, may repeat after 2 hours.  Max of 3 tabs in 24 hours.      I have discontinued Ms. Wigfall's fluticasone furoate-vilanterol and rizatriptan. I am also having her start on frovatriptan. Additionally, I am having her maintain her norgestrel-ethinyl estradiol, albuterol, and pantoprazole. We administered ketorolac and betamethasone acetate-betamethasone sodium phosphate. We will continue to administer betamethasone acetate-betamethasone sodium phosphate and ketorolac.  Meds ordered this encounter  Medications  . pantoprazole (PROTONIX) 40 MG tablet    Sig: Take 40 mg by mouth every morning.    Refill:  2  . frovatriptan (FROVA) 2.5 MG tablet    Sig: Take 1 tablet (2.5 mg total) by mouth as needed for migraine. If recurs, may repeat after 2 hours. Max of 3 tabs in 24 hours.    Dispense:  10 tablet    Refill:  2  . DISCONTD:  methylPREDNISolone acetate (DEPO-MEDROL) injection 80 mg  . ketorolac (TORADOL) injection 60 mg  . betamethasone acetate-betamethasone sodium phosphate (CELESTONE) injection 12 mg  . betamethasone acetate-betamethasone sodium phosphate (CELESTONE) injection 6 mg  . ketorolac (TORADOL) injection 60 mg     Follow-up: Return in about 2 weeks (around 11/21/2015) for with Dr. Louanne Skyeettinger for HA.  Mechele ClaudeWarren Jourdan Maldonado, M.D.

## 2015-11-08 MED ORDER — KETOROLAC TROMETHAMINE 60 MG/2ML IM SOLN: 60.0000 mg | Freq: Once | INTRAMUSCULAR | Status: DC

## 2015-11-08 MED ORDER — BETAMETHASONE SOD PHOS & ACET 6 (3-3) MG/ML IJ SUSP: 6.0000 mg | Freq: Once | INTRAMUSCULAR | Status: DC

## 2015-11-11 ENCOUNTER — Encounter: Payer: Self-pay | Admitting: Nurse Practitioner

## 2015-11-11 ENCOUNTER — Ambulatory Visit (INDEPENDENT_AMBULATORY_CARE_PROVIDER_SITE_OTHER): Payer: BC Managed Care – PPO | Admitting: Nurse Practitioner

## 2015-11-11 VITALS — BP 118/81 | HR 78 | Temp 98.2°F | Ht 65.0 in | Wt 135.1 lb

## 2015-11-11 DIAGNOSIS — J0101 Acute recurrent maxillary sinusitis: Secondary | ICD-10-CM

## 2015-11-11 MED ORDER — AZITHROMYCIN 250 MG PO TABS: ORAL_TABLET | ORAL | 0 refills | Status: DC

## 2015-11-11 NOTE — Patient Instructions (Signed)

## 2015-11-11 NOTE — Progress Notes (Signed)
Subjective:     Tonya Lee is a 30 y.o. female who presents for evaluation of sinus pain. Symptoms include: clear rhinorrhea, congestion, cough, facial pain, fevers, headaches, nasal congestion, sinus pressure and sore throat. Onset of symptoms was 3 days ago. Symptoms have been gradually worsening since that time. Past history is significant for no history of pneumonia or bronchitis. Patient is a non-smoker.  The following portions of the patient's history were reviewed and updated as appropriate: allergies, current medications, past family history, past medical history, past social history, past surgical history and problem list.  Review of Systems Pertinent items noted in HPI and remainder of comprehensive ROS otherwise negative.   Objective:    BP 118/81   Pulse 78   Temp 98.2 F (36.8 C) (Oral)   Ht 5\' 5"  (1.651 m)   Wt 135 lb 2 oz (61.3 kg)   LMP 10/24/2015 (Approximate)   BMI 22.49 kg/m  General appearance: alert and cooperative Eyes: conjunctivae/corneas clear. PERRL, EOM's intact. Fundi benign. Ears: normal TM's and external ear canals both ears Nose: clear discharge, moderate congestion, turbinates red, sinus tenderness bilateral Throat: lips, mucosa, and tongue normal; teeth and gums normal Neck: no adenopathy, no carotid bruit, no JVD, supple, symmetrical, trachea midline and thyroid not enlarged, symmetric, no tenderness/mass/nodules Lungs: clear to auscultation bilaterally Heart: regular rate and rhythm, S1, S2 normal, no murmur, click, rub or gallop    Assessment:    Acute bacterial sinusitis.    Plan:     1. Take meds as prescribed 2. Use a cool mist humidifier especially during the winter months and when heat has been humid. 3. Use saline nose sprays frequently 4. Saline irrigations of the nose can be very helpful if done frequently.  * 4X daily for 1 week*  * Use of a nettie pot can be helpful with this. Follow directions with this* 5. Drink plenty of  fluids 6. Keep thermostat turn down low 7.For any cough or congestion  Use plain Mucinex- regular strength or max strength is fine   * Children- consult with Pharmacist for dosing 8. For fever or aces or pains- take tylenol or ibuprofen appropriate for age and weight.  * for fevers greater than 101 orally you may alternate ibuprofen and tylenol every  3 hours.   Meds ordered this encounter  Medications  . azithromycin (ZITHROMAX Z-PAK) 250 MG tablet    Sig: As directed    Dispense:  6 tablet    Refill:  0    Order Specific Question:   Supervising Provider    Answer:   Tonya Lee, Tonya Lee [[id number]]   Tonya Daphine DeutscherMartin, FNP

## 2016-02-03 ENCOUNTER — Ambulatory Visit (INDEPENDENT_AMBULATORY_CARE_PROVIDER_SITE_OTHER): Payer: BC Managed Care – PPO | Admitting: Family Medicine

## 2016-02-03 ENCOUNTER — Encounter: Payer: Self-pay | Admitting: Family Medicine

## 2016-02-03 VITALS — BP 121/85 | HR 81 | Temp 97.1°F | Ht 65.0 in | Wt 136.0 lb

## 2016-02-03 DIAGNOSIS — R0989 Other specified symptoms and signs involving the circulatory and respiratory systems: Secondary | ICD-10-CM | POA: Diagnosis not present

## 2016-02-03 DIAGNOSIS — R1013 Epigastric pain: Secondary | ICD-10-CM

## 2016-02-03 DIAGNOSIS — R1011 Right upper quadrant pain: Secondary | ICD-10-CM | POA: Diagnosis not present

## 2016-02-03 LAB — CBC WITH DIFFERENTIAL/PLATELET
BASOS ABS: 0 10*3/uL (ref 0.0–0.2)
Basos: 0 %
EOS (ABSOLUTE): 0.4 10*3/uL (ref 0.0–0.4)
EOS: 7 %
HEMATOCRIT: 38.7 % (ref 34.0–46.6)
HEMOGLOBIN: 12.8 g/dL (ref 11.1–15.9)
IMMATURE GRANS (ABS): 0 10*3/uL (ref 0.0–0.1)
IMMATURE GRANULOCYTES: 0 %
LYMPHS: 38 %
Lymphocytes Absolute: 2 10*3/uL (ref 0.7–3.1)
MCH: 29.9 pg (ref 26.6–33.0)
MCHC: 33.1 g/dL (ref 31.5–35.7)
MCV: 90 fL (ref 79–97)
MONOCYTES: 7 %
Monocytes Absolute: 0.4 10*3/uL (ref 0.1–0.9)
NEUTROS PCT: 48 %
Neutrophils Absolute: 2.6 10*3/uL (ref 1.4–7.0)
Platelets: 338 10*3/uL (ref 150–379)
RBC: 4.28 x10E6/uL (ref 3.77–5.28)
RDW: 12.9 % (ref 12.3–15.4)
WBC: 5.3 10*3/uL (ref 3.4–10.8)

## 2016-02-03 LAB — URINALYSIS, COMPLETE
BILIRUBIN UA: NEGATIVE
Glucose, UA: NEGATIVE
Nitrite, UA: NEGATIVE
PH UA: 5.5 (ref 5.0–7.5)
Protein, UA: NEGATIVE
Specific Gravity, UA: 1.025 (ref 1.005–1.030)
UUROB: 0.2 mg/dL (ref 0.2–1.0)

## 2016-02-03 LAB — MICROSCOPIC EXAMINATION: Renal Epithel, UA: NONE SEEN /hpf

## 2016-02-03 MED ORDER — PANTOPRAZOLE SODIUM 40 MG PO TBEC
40.0000 mg | DELAYED_RELEASE_TABLET | Freq: Two times a day (BID) | ORAL | 2 refills | Status: DC
Start: 2016-02-03 — End: 2016-10-29

## 2016-02-03 MED ORDER — ONDANSETRON 8 MG PO TBDP: 8.0000 mg | ORAL_TABLET | Freq: Four times a day (QID) | ORAL | 1 refills | Status: DC | PRN

## 2016-02-03 NOTE — Progress Notes (Signed)
Subjective:  Patient ID: Tonya Lee, female    DOB: 03-06-1985  Age: 31 y.o. MRN: [id number]  CC: Abdominal Pain (pt here today c/o feeling bad after eating since she stopped taking her protonix for 2 months but then restarted it about 9 days ago without any relief)   HPI Kalise Rother presents for Recurrent abdominal pain. Described as moderately severe burning and heartburn in the epigastrium. It is intermittent. It has been ongoing for just over 1 year. It has been relieved in the past by pantoprazole, however it recurs each time she stops the medication for a year. Unfortunately she stopped it several weeks ago. Her symptoms recurred at lunch time on January 12. At that time she felt heartburn after the first few bites of a meal. She went back to using the pantoprazole daily, however 11 days later her pain remains. There is no back pain or excessive belching. She can only tolerate foods such as bananas rice and toast. She is having some associated loose bowel movements. 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   She has a past surgical history that includes Ankle surgery (Right, july 2013) and Fasciectomy (Right).   Her family history includes Asthma in her maternal grandmother and mother; Cancer in her maternal grandfather; Diabetes in her maternal grandfather; Heart disease in her maternal grandfather and paternal grandfather; Hyperlipidemia in her maternal grandmother; Hypertension in her maternal grandmother.She reports that she has never smoked. She has never used smokeless tobacco. She reports that she does not drink alcohol or use drugs.    ROS Review of Systems  Constitutional: Negative for activity change, appetite change and fever.  HENT: Negative for congestion, rhinorrhea and sore throat.   Eyes: Negative for visual disturbance.  Respiratory: Negative for cough and shortness of breath.     Cardiovascular: Negative for chest pain and palpitations.  Gastrointestinal: Positive for abdominal pain, diarrhea, nausea and vomiting. Negative for abdominal distention, anal bleeding, blood in stool and constipation.  Genitourinary: Negative for dysuria.  Musculoskeletal: Negative for arthralgias and myalgias.    Objective:  BP 121/85   Pulse 81   Temp 97.1 F (36.2 C) (Oral)   Ht '5\' 5"'  (1.651 m)   Wt 136 lb (61.7 kg)   LMP 01/13/2016 (Approximate)   BMI 22.63 kg/m   BP Readings from Last 3 Encounters:  02/03/16 121/85  11/11/15 118/81  11/07/15 117/80    Wt Readings from Last 3 Encounters:  02/03/16 136 lb (61.7 kg)  11/11/15 135 lb 2 oz (61.3 kg)  11/07/15 134 lb 6 oz (61 kg)     Physical Exam  Constitutional: She is oriented to person, place, and time. She appears well-developed and well-nourished.  HENT:  Head: Normocephalic and atraumatic.  Cardiovascular: Normal rate and regular rhythm.   No murmur heard. Pulmonary/Chest: Effort normal and breath sounds normal.  Abdominal: Soft. Bowel sounds are normal. She exhibits no mass. There is tenderness (moderate epigastric tenderness and RUQ tenderness. Negative murphy sign. No McBurney point tenderness. Loud bruit at epigastrum). There is no rebound and no guarding.  Neurological: She is alert and oriented to person, place, and time.  Skin: Skin is warm and dry.  Psychiatric: She has a normal mood and affect. Her behavior is normal.    Ct Abdomen Pelvis W Contrast  Result Date: 01/30/2015 CLINICAL DATA:  Periumbilical abdominal pain wall worse today. Recently diagnosed with a urinary tract infection. Nausea. EXAM:  CT ABDOMEN AND PELVIS WITH CONTRAST TECHNIQUE: Multidetector CT imaging of the abdomen and pelvis was performed using the standard protocol following bolus administration of intravenous contrast. CONTRAST:  169m OMNIPAQUE IOHEXOL 300 MG/ML  SOLN COMPARISON:  None. FINDINGS: The visualized lung bases are  clear. There is minimal periportal edema. A subcentimeter low-density lesion in the inferior right hepatic lobe is too small to fully characterize. The gallbladder, spleen, adrenal glands, left kidney, and pancreas are unremarkable. There is a 3 mm nonobstructing calculus in the interpolar right kidney. Oral contrast is present in multiple nondilated loops of small bowel. There is no evidence of bowel obstruction or inflammation. The appendix is identified in the right lower quadrant and is unremarkable. The bladder is mildly distended and unremarkable. The uterus and ovaries are grossly unremarkable. No free fluid or enlarged lymph nodes are identified. No abdominal wall hernia is seen. No acute osseous abnormality is identified. IMPRESSION: 1. Punctate, nonobstructing right renal calculus. 2. Trace periportal edema, nonspecific. Electronically Signed   By: ALogan BoresM.D.   On: 01/30/2015 14:57    Assessment & Plan:   Loreda was seen today for abdominal pain.  Diagnoses and all orders for this visit:  Dyspepsia -     Amylase -     Lipase -     Urinalysis, Complete -     H. pylori antigen, stool -     CBC with Differential/Platelet -     CMP14+EGFR -     [place]domen Complete; Future  RUQ pain -     Amylase -     Lipase -     Urinalysis, Complete -     H. pylori antigen, stool -     CBC with Differential/Platelet -     CMP14+EGFR -     [place]domen Complete; Future -     [place]Aorta; Future  Abdominal bruit -     [place]orta; Future  Other orders -     ondansetron (ZOFRAN-ODT) 8 MG disintegrating tablet; Take 1 tablet (8 mg total) by mouth every 6 (six) hours as needed for nausea or vomiting. -     pantoprazole (PROTONIX) 40 MG tablet; Take 1 tablet (40 mg total) by mouth 2 (two) times daily.      I have discontinued Ms. Corsino's azithromycin. I have also changed her pantoprazole. Additionally, I am having her start on ondansetron. Lastly, I am having her maintain her  norgestrel-ethinyl estradiol, albuterol, and frovatriptan.  Allergies as of 02/03/2016      Reactions   Sulfa Antibiotics Anaphylaxis, Hives, Other (See Comments)   Throat and face swelled   Acyclovir And Related Hives   Prednisone Nausea And Vomiting   Nitrofuran Derivatives Nausea Only   Abdominal pain      Medication List       Accurate as of 02/03/16  8:30 AM. Always use your most recent med list.          albuterol 108 (90 Base) MCG/ACT inhaler Commonly known as:  PROVENTIL HFA;VENTOLIN HFA Inhale 2 puffs into the lungs every 6 (six) hours as needed for wheezing or shortness of breath.   CRYSELLE-28 0.3-30 MG-MCG tablet Generic drug:  norgestrel-ethinyl estradiol Take 1 tablet by mouth daily.   frovatriptan 2.5 MG tablet Commonly known as:  FROVA Take 1 tablet (2.5 mg total) by mouth as needed for migraine. If recurs, may repeat after 2 hours. Max of 3 tabs in 24 hours.   ondansetron 8 MG disintegrating  tablet Commonly known as:  ZOFRAN-ODT Take 1 tablet (8 mg total) by mouth every 6 (six) hours as needed for nausea or vomiting.   pantoprazole 40 MG tablet Commonly known as:  PROTONIX Take 1 tablet (40 mg total) by mouth 2 (two) times daily.        Follow-up: Return in about 1 week (around 02/10/2016), or if symptoms worsen or fail to improve.  Claretta Fraise, M.D.

## 2016-02-04 LAB — AMYLASE: Amylase: 88 U/L (ref 31–124)

## 2016-02-04 LAB — CMP14+EGFR
ALBUMIN: 4.1 g/dL (ref 3.5–5.5)
ALK PHOS: 27 IU/L — AB (ref 39–117)
ALT: 11 IU/L (ref 0–32)
AST: 25 IU/L (ref 0–40)
Albumin/Globulin Ratio: 1.8 (ref 1.2–2.2)
BILIRUBIN TOTAL: 0.3 mg/dL (ref 0.0–1.2)
BUN / CREAT RATIO: 11 (ref 9–23)
BUN: 9 mg/dL (ref 6–20)
CHLORIDE: 105 mmol/L (ref 96–106)
CO2: 19 mmol/L (ref 18–29)
Calcium: 8.8 mg/dL (ref 8.7–10.2)
Creatinine, Ser: 0.81 mg/dL (ref 0.57–1.00)
GFR calc non Af Amer: 98 mL/min/{1.73_m2} (ref >59)
GFR, EST AFRICAN AMERICAN: 114 mL/min/{1.73_m2} (ref >59)
GLOBULIN, TOTAL: 2.3 g/dL (ref 1.5–4.5)
Glucose: 80 mg/dL (ref 65–99)
Potassium: 4.2 mmol/L (ref 3.5–5.2)
SODIUM: 141 mmol/L (ref 134–144)
Total Protein: 6.4 g/dL (ref 6.0–8.5)

## 2016-02-04 LAB — LIPASE: Lipase: 45 U/L (ref 14–72)

## 2016-02-11 ENCOUNTER — Inpatient Hospital Stay (HOSPITAL_COMMUNITY): Admission: RE | Admit: 2016-02-11 | Payer: BC Managed Care – PPO | Source: Ambulatory Visit

## 2016-02-11 ENCOUNTER — Ambulatory Visit (HOSPITAL_COMMUNITY): Payer: BC Managed Care – PPO

## 2016-02-16 ENCOUNTER — Ambulatory Visit (HOSPITAL_COMMUNITY)
Admission: RE | Admit: 2016-02-16 | Discharge: 2016-02-16 | Disposition: A | Discharge status: Home / Self Care | Payer: BC Managed Care – PPO | Source: Ambulatory Visit | Attending: Family Medicine | Admitting: Family Medicine

## 2016-02-16 DIAGNOSIS — R0989 Other specified symptoms and signs involving the circulatory and respiratory systems: Secondary | ICD-10-CM | POA: Diagnosis present

## 2016-02-16 DIAGNOSIS — R1013 Epigastric pain: Secondary | ICD-10-CM

## 2016-02-16 DIAGNOSIS — R1011 Right upper quadrant pain: Secondary | ICD-10-CM | POA: Diagnosis not present

## 2016-08-13 ENCOUNTER — Other Ambulatory Visit: Payer: Self-pay | Admitting: Obstetrics and Gynecology

## 2016-08-13 DIAGNOSIS — R0989 Other specified symptoms and signs involving the circulatory and respiratory systems: Secondary | ICD-10-CM

## 2016-08-17 ENCOUNTER — Ambulatory Visit
Admission: RE | Admit: 2016-08-17 | Discharge: 2016-08-17 | Disposition: A | Discharge status: Home / Self Care | Payer: BC Managed Care – PPO | Source: Ambulatory Visit | Attending: Obstetrics and Gynecology | Admitting: Obstetrics and Gynecology

## 2016-08-17 DIAGNOSIS — R0989 Other specified symptoms and signs involving the circulatory and respiratory systems: Secondary | ICD-10-CM

## 2016-08-19 ENCOUNTER — Other Ambulatory Visit: Payer: BC Managed Care – PPO

## 2016-09-14 ENCOUNTER — Ambulatory Visit (INDEPENDENT_AMBULATORY_CARE_PROVIDER_SITE_OTHER): Payer: BC Managed Care – PPO | Admitting: Family Medicine

## 2016-09-14 ENCOUNTER — Encounter: Payer: Self-pay | Admitting: Family Medicine

## 2016-09-14 VITALS — BP 121/80 | HR 72 | Temp 98.6°F | Ht 65.0 in | Wt 135.6 lb

## 2016-09-14 DIAGNOSIS — G43819 Other migraine, intractable, without status migrainosus: Secondary | ICD-10-CM | POA: Diagnosis not present

## 2016-09-14 MED ORDER — KETOROLAC TROMETHAMINE 60 MG/2ML IM SOLN
60.0000 mg | Freq: Once | INTRAMUSCULAR | Status: AC
  Administered 2016-09-14: 60 mg via INTRAMUSCULAR

## 2016-09-14 MED ORDER — METHYLPREDNISOLONE ACETATE 80 MG/ML IJ SUSP
80.0000 mg | Freq: Once | INTRAMUSCULAR | Status: AC
  Administered 2016-09-14: 80 mg via INTRAMUSCULAR

## 2016-09-14 MED ORDER — PROMETHAZINE HCL 25 MG PO TABS: 25.0000 mg | ORAL_TABLET | Freq: Three times a day (TID) | ORAL | 0 refills | Status: DC | PRN

## 2016-09-14 MED ORDER — FROVATRIPTAN SUCCINATE 2.5 MG PO TABS: 2.5000 mg | ORAL_TABLET | ORAL | 2 refills | Status: DC | PRN

## 2016-09-14 MED ORDER — METOPROLOL TARTRATE 25 MG PO TABS: 25.0000 mg | ORAL_TABLET | Freq: Two times a day (BID) | ORAL | 2 refills | Status: DC

## 2016-09-14 NOTE — Progress Notes (Signed)
ig

## 2016-09-14 NOTE — Patient Instructions (Addendum)
Great to meet you!  Take promethazine for nausea, it will cause sleepiness n most cases  Try metoprolol 1 pill twice daily for migraine prophylaxis, come back in 1 month or so to see Dr Dettinger or myself to see how it is going.    Migraine Headache A migraine headache is a very strong throbbing pain on one side or both sides of your head. Migraines can also cause other symptoms. Talk with your doctor about what things may bring on (trigger) your migraine headaches. Follow these instructions at home: Medicines  Take over-the-counter and prescription medicines only as told by your doctor.  Do not drive or use heavy machinery while taking prescription pain medicine.  To prevent or treat constipation while you are taking prescription pain medicine, your doctor may recommend that you: ? Drink enough fluid to keep your pee (urine) clear or pale yellow. ? Take over-the-counter or prescription medicines. ? Eat foods that are high in fiber. These include fresh fruits and vegetables, whole grains, and beans. ? Limit foods that are high in fat and processed sugars. These include fried and sweet foods. Lifestyle  Avoid alcohol.  Do not use any products that contain nicotine or tobacco, such as cigarettes and e-cigarettes. If you need help quitting, ask your doctor.  Get at least 8 hours of sleep every night.  Limit your stress. General instructions   Keep a journal to find out what may bring on your migraines. For example, write down: ? What you eat and drink. ? How much sleep you get. ? Any change in what you eat or drink. ? Any change in your medicines.  If you have a migraine: ? Avoid things that make your symptoms worse, such as bright lights. ? It may help to lie down in a dark, quiet room. ? Do not drive or use heavy machinery. ? Ask your doctor what activities are safe for you.  Keep all follow-up visits as told by your doctor. This is important. Contact a doctor if:  You  get a migraine that is different or worse than your usual migraines. Get help right away if:  Your migraine gets very bad.  You have a fever.  You have a stiff neck.  You have trouble seeing.  Your muscles feel weak or like you cannot control them.  You start to lose your balance a lot.  You start to have trouble walking.  You pass out (faint). This information is not intended to replace advice given to you by your health care provider. Make sure you discuss any questions you have with your health care provider. Document Released: 10/07/2007 Document Revised: 07/18/2015 Document Reviewed: 06/16/2015 Elsevier Interactive Patient Education  2017 [employer]

## 2016-09-14 NOTE — Progress Notes (Signed)
   HPI  Patient presents today here for migraine headache.  Patient explains that she began having a migraine about one day ago with a glittery aura She describes sided retro-orbital headache. She denies any numbness, tingling, weakness. She has worsening with whites, sounds, and smells, she also has associated nausea with no vomiting.  Patient has history of severe migraine headaches treated with Topamax during college. After she finished college she was able to stop due to infrequent headaches. Over the last 1 month she has had a headache on most days, she tried two doses of triptan yesterday with no improvement.   PMH: Smoking status noted ROS: Per HPI  Objective: BP 121/80   Pulse 72   Temp 98.6 F (37 C) (Oral)   Ht 5\' 5"  (1.651 m)   Wt 135 lb 9.6 oz (61.5 kg)   BMI 22.57 kg/m  Gen: NAD, alert, cooperative with exam HEENT: NCAT, EOMI, PERRL CV: RRR, good S1/S2, no murmur Resp: CTABL, no wheezes, non-labored Abd: SNTND, BS present, no guarding or organomegaly Ext: No edema, warm Neuro: Alert and oriented, cranial nerves II through XII intact, strength 5/5 and sensation intact in bilateral upper and lower extremities, normal gait  Assessment and plan:  # Migraine headache Treated with IM Depo-Medrol and Toradol today. Has history of ulcer, clinically diagnosed, I recommend to continue avoiding NSAIDs, we have had a small exposure today with Toradol, however I think in her case benefit outweighs risk currently. Phenergan for nausea We discussed at length and she feels that a prophylactic therapy may be helpful given her almost daily headaches over the last month. Metoprolol, follow-up with PCP or myself in one month   Meds ordered this encounter  Medications  . ketorolac (TORADOL) injection 60 mg  . methylPREDNISolone acetate (DEPO-MEDROL) injection 80 mg  . promethazine (PHENERGAN) 25 MG tablet    Sig: Take 1 tablet (25 mg total) by mouth every 8 (eight) hours as  needed for nausea or vomiting.    Dispense:  20 tablet    Refill:  0  . metoprolol tartrate (LOPRESSOR) 25 MG tablet    Sig: Take 1 tablet (25 mg total) by mouth 2 (two) times daily.    Dispense:  60 tablet    Refill:  2  . frovatriptan (FROVA) 2.5 MG tablet    Sig: Take 1 tablet (2.5 mg total) by mouth as needed for migraine. If recurs, may repeat after 2 hours. Max of 3 tabs in 24 hours.    Dispense:  10 tablet    Refill:  2    Murtis SinkSam Dagon Budai, MD [facility] 09/14/2016, 5:04 PM

## 2016-09-30 ENCOUNTER — Other Ambulatory Visit: Payer: Self-pay | Admitting: Family Medicine

## 2016-10-29 ENCOUNTER — Other Ambulatory Visit: Payer: Self-pay

## 2016-10-29 MED ORDER — PANTOPRAZOLE SODIUM 40 MG PO TBEC: 40.0000 mg | DELAYED_RELEASE_TABLET | Freq: Two times a day (BID) | ORAL | 1 refills | Status: DC

## 2016-11-02 ENCOUNTER — Telehealth: Payer: BC Managed Care – PPO | Admitting: Nurse Practitioner

## 2016-11-02 DIAGNOSIS — J111 Influenza due to unidentified influenza virus with other respiratory manifestations: Secondary | ICD-10-CM

## 2016-11-02 NOTE — Progress Notes (Signed)

## 2016-11-03 ENCOUNTER — Ambulatory Visit (INDEPENDENT_AMBULATORY_CARE_PROVIDER_SITE_OTHER): Payer: BC Managed Care – PPO | Admitting: Family Medicine

## 2016-11-03 ENCOUNTER — Encounter: Payer: Self-pay | Admitting: Family Medicine

## 2016-11-03 ENCOUNTER — Ambulatory Visit: Payer: BC Managed Care – PPO | Admitting: Family Medicine

## 2016-11-03 ENCOUNTER — Telehealth: Payer: Self-pay | Admitting: Family Medicine

## 2016-11-03 VITALS — BP 111/72 | HR 81 | Temp 97.9°F | Ht 65.0 in | Wt 131.0 lb

## 2016-11-03 DIAGNOSIS — R52 Pain, unspecified: Secondary | ICD-10-CM

## 2016-11-03 DIAGNOSIS — B349 Viral infection, unspecified: Secondary | ICD-10-CM | POA: Diagnosis not present

## 2016-11-03 DIAGNOSIS — J029 Acute pharyngitis, unspecified: Secondary | ICD-10-CM | POA: Diagnosis not present

## 2016-11-03 LAB — CBC WITH DIFFERENTIAL/PLATELET
BASOS: 0 %
Basophils Absolute: 0 10*3/uL (ref 0.0–0.2)
EOS (ABSOLUTE): 0.3 10*3/uL (ref 0.0–0.4)
Eos: 5 %
HEMATOCRIT: 41.8 % (ref 34.0–46.6)
Hemoglobin: 14.6 g/dL (ref 11.1–15.9)
LYMPHS ABS: 1 10*3/uL (ref 0.7–3.1)
LYMPHS: 15 %
MCH: 30.3 pg (ref 26.6–33.0)
MCHC: 34.9 g/dL (ref 31.5–35.7)
MCV: 87 fL (ref 79–97)
MONOCYTES: 10 %
Monocytes Absolute: 0.7 10*3/uL (ref 0.1–0.9)
NEUTROS ABS: 4.9 10*3/uL (ref 1.4–7.0)
Neutrophils: 70 %
PLATELETS: 343 10*3/uL (ref 150–379)
RBC: 4.82 x10E6/uL (ref 3.77–5.28)
RDW: 12.9 % (ref 12.3–15.4)
WBC: 7 10*3/uL (ref 3.4–10.8)

## 2016-11-03 LAB — CULTURE, GROUP A STREP

## 2016-11-03 LAB — VERITOR FLU A/B WAIVED
Influenza A: NEGATIVE
Influenza B: NEGATIVE

## 2016-11-03 LAB — RAPID STREP SCREEN (MED CTR MEBANE ONLY): STREP GP A AG, IA W/REFLEX: NEGATIVE

## 2016-11-03 MED ORDER — ONDANSETRON 8 MG PO TBDP: 8.0000 mg | ORAL_TABLET | Freq: Four times a day (QID) | ORAL | 1 refills | Status: DC | PRN

## 2016-11-03 MED ORDER — IBUPROFEN 200 MG PO TABS: 600.0000 mg | ORAL_TABLET | Freq: Four times a day (QID) | ORAL | 99 refills | Status: DC | PRN

## 2016-11-03 NOTE — Telephone Encounter (Signed)
What symptoms do you have? High fever, sore throat, cold chills, body aches, then gets really hot, dry cough  How long have you been sick? Yesterday morning  Have you been seen for this problem? no  If your provider decides to give you a prescription, which pharmacy would you like for it to be sent to? [facility]   Patient informed that this information will be sent to the clinical staff for review and that they should receive a follow up call.

## 2016-11-03 NOTE — Telephone Encounter (Signed)
Patient aware ntbs- states she will call back to make apt when she knows a time she can come in.

## 2016-11-03 NOTE — Progress Notes (Signed)
Chief Complaint  Patient presents with  . Generalized Body Aches    pt here today c/o body aches, sore throat, chills and fever.    HPI  Patient  presents with Fever to 102. Severe sore throat. Patient reports coughing occasionally. Nonproductive. She has had chills & sweats.  Reports excessive salivation. Decreased appetite and nausea, but no vomiting. The patient denies being short of breath. Onset was yesterday Morning. Getting worse. Tried OTC tylenol without improvement.  PMH: Smoking status noted ROS: Per HPI  Objective: BP 111/72   Pulse 81   Temp 97.9 F (36.6 C) (Oral)   Ht 5\' 5"  (1.651 m)   Wt 131 lb (59.4 kg)   BMI 21.80 kg/m  Gen: NAD, alert, cooperative with exam HEENT: NCAT, Nasal passages clear TMs nml. Mild pharyngeal erythema CV: RRR, good S1/S2, no murmur Resp: CTA Ext: No edema, warm Neuro: Alert and oriented, No gross deficits  Assessment and plan:  1. Body aches   2. Acute viral syndrome   3. Sore throat     Meds ordered this encounter  Medications  . ibuprofen (ADVIL,MOTRIN) 200 MG tablet    Sig: Take 3 tablets (600 mg total) by mouth every 6 (six) hours as needed for fever or moderate pain.    Dispense:  100 tablet    Refill:  prn  . ondansetron (ZOFRAN-ODT) 8 MG disintegrating tablet    Sig: Take 1 tablet (8 mg total) by mouth every 6 (six) hours as needed for nausea or vomiting.    Dispense:  20 tablet    Refill:  1    Orders Placed This Encounter  Procedures  . Rapid strep screen (not [facility])  . Veritor Flu A/B Waived    Order Specific Question:   Source    Answer:   nasal  . CBC with Differential/Platelet    Follow up as needed.  Mechele ClaudeWarren Aydin Hink, MD

## 2016-11-29 ENCOUNTER — Other Ambulatory Visit: Payer: Self-pay | Admitting: *Deleted

## 2016-11-29 ENCOUNTER — Other Ambulatory Visit: Payer: Self-pay

## 2016-11-29 MED ORDER — METOPROLOL TARTRATE 25 MG PO TABS: 25.0000 mg | ORAL_TABLET | Freq: Two times a day (BID) | ORAL | 0 refills | Status: DC

## 2016-12-25 IMAGING — CT CT ABD-PELV W/ CM
2 of 4 series · 17 of 46 positions shown, 19 images · IV contrast (OMNIPAQUE 300)
Comparison: None.

CLINICAL DATA: Periumbilical abdominal pain wall worse today.
Recently diagnosed with a urinary tract infection. Nausea.

EXAM:
CT ABDOMEN AND PELVIS WITH CONTRAST
TECHNIQUE: Multidetector CT imaging of the abdomen and pelvis was performed
using the standard protocol following bolus administration of
intravenous contrast.
CONTRAST:  100mL OMNIPAQUE IOHEXOL 300 MG/ML  SOLN

[Series 2: abd/pel with · axial · 0.64mm/px · z∈[-668,-294]mm · 14 of 83 slices shown, 16 images]
[im 4/83  soft-tissue]
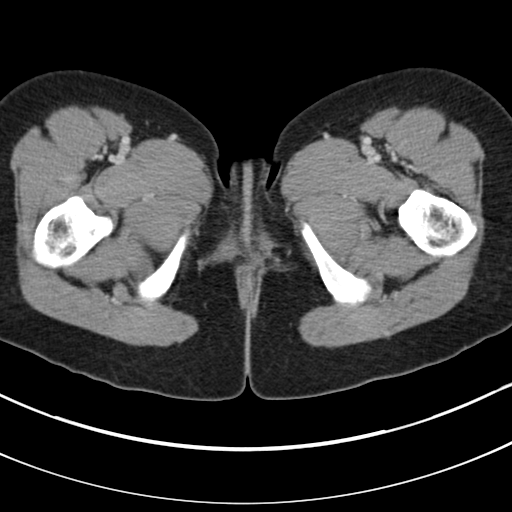
[im 4/83  bone]
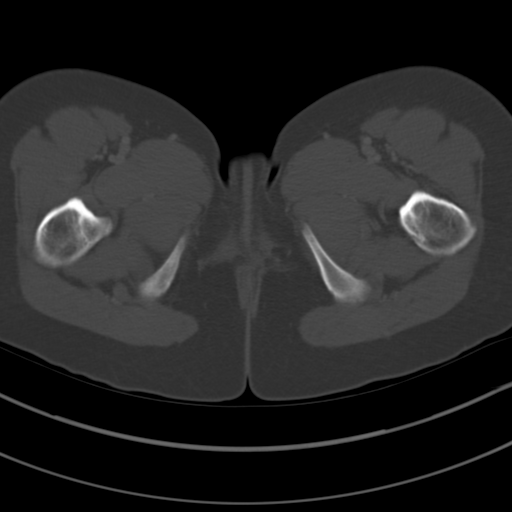
[im 11/83  soft-tissue]
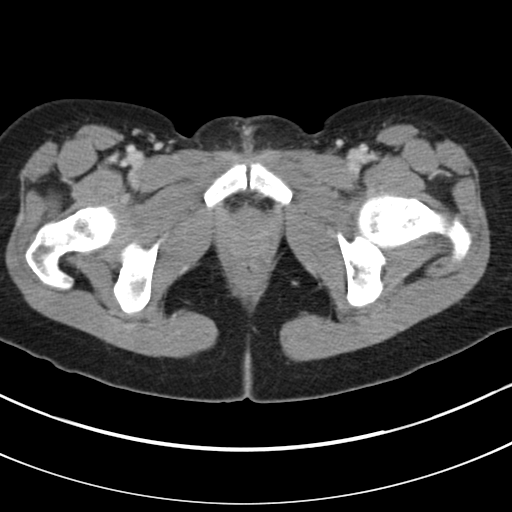
[im 15/83  soft-tissue]
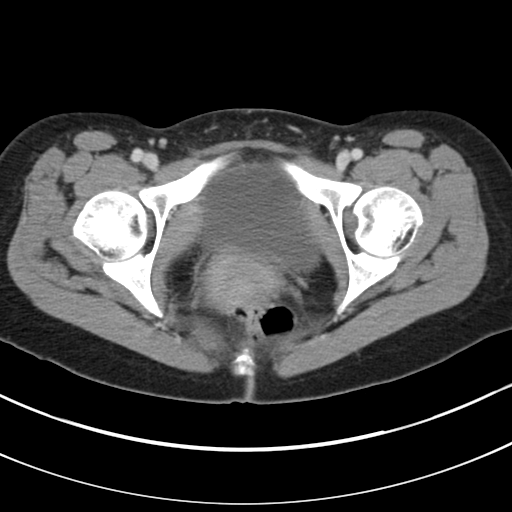
[im 22/83  soft-tissue]
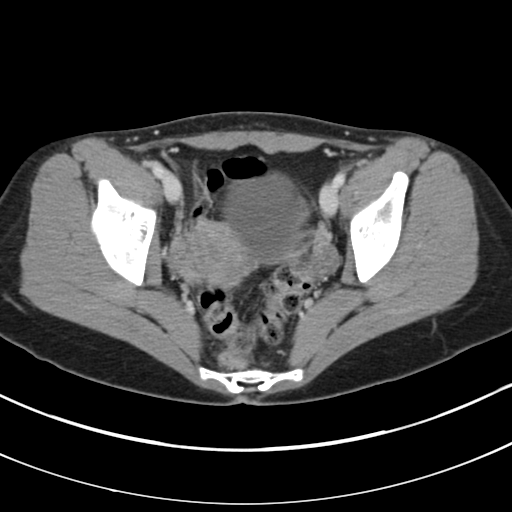
[im 29/83  soft-tissue]
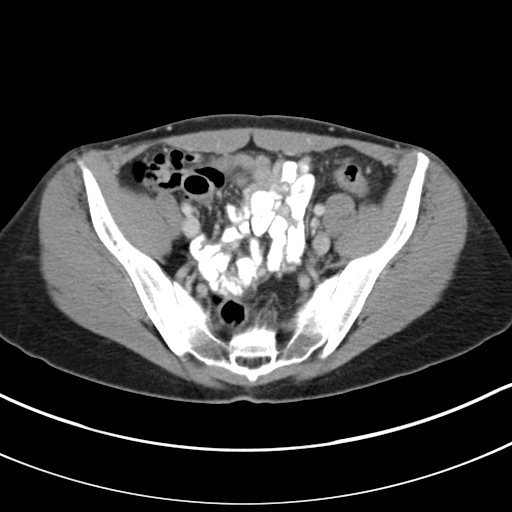
[im 33/83  soft-tissue]
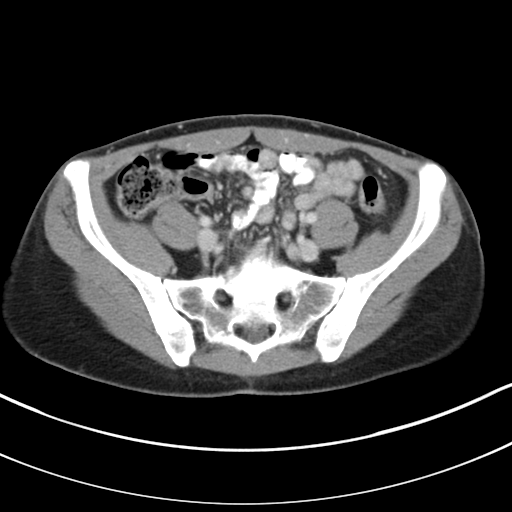
[im 40/83  soft-tissue]
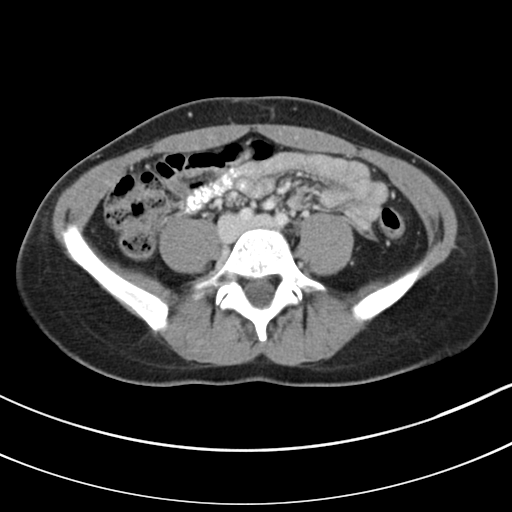
[im 43/83  soft-tissue]
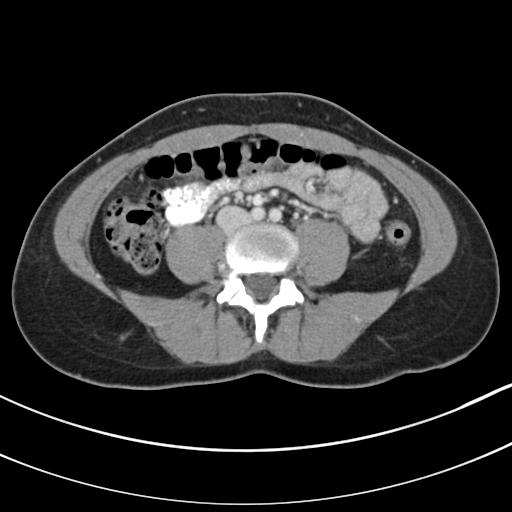
[im 50/83  soft-tissue]
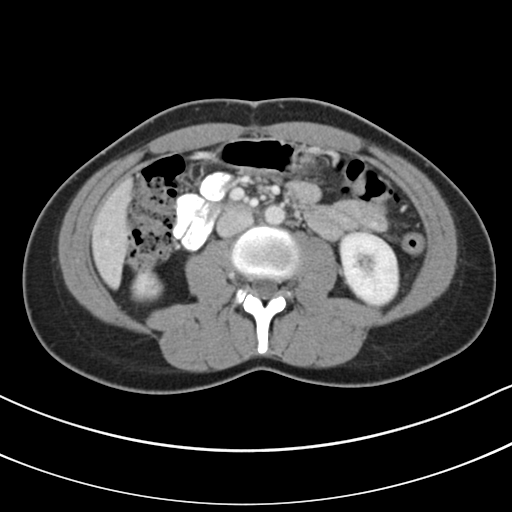
[im 50/83  bone]
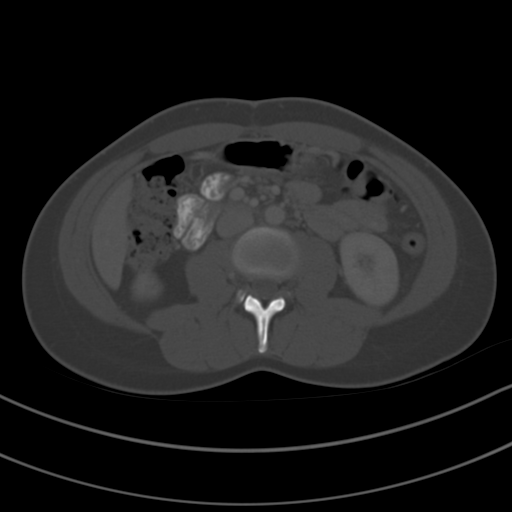
[im 54/83  soft-tissue]
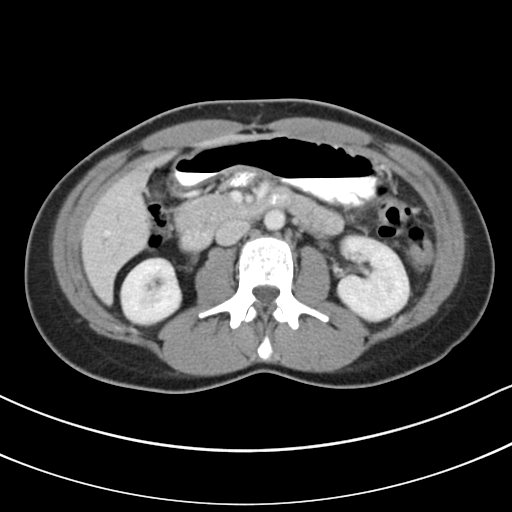
[im 61/83  soft-tissue]
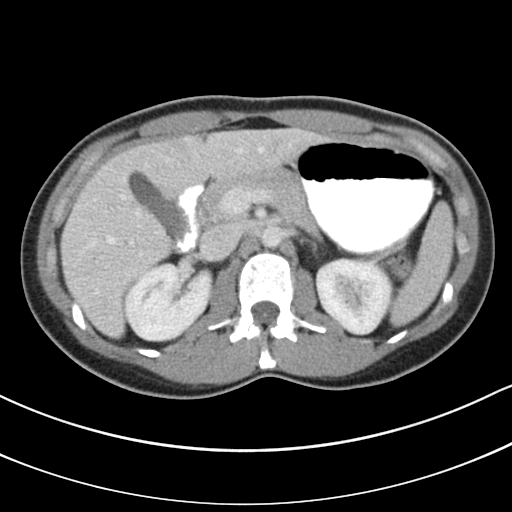
[im 68/83  soft-tissue]
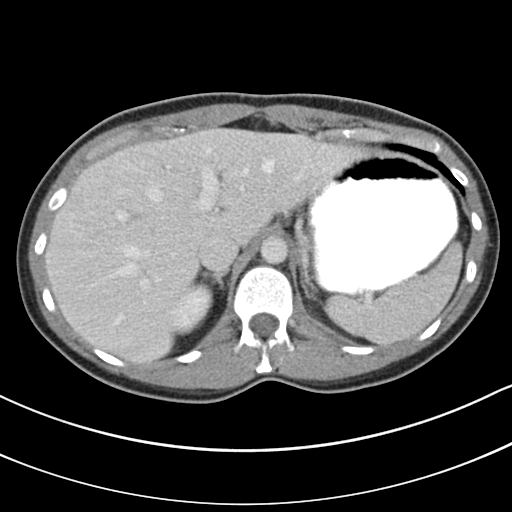
[im 72/83  soft-tissue]
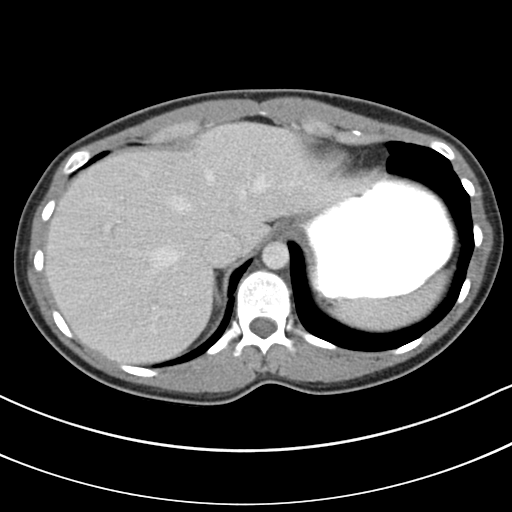
[im 79/83  soft-tissue]
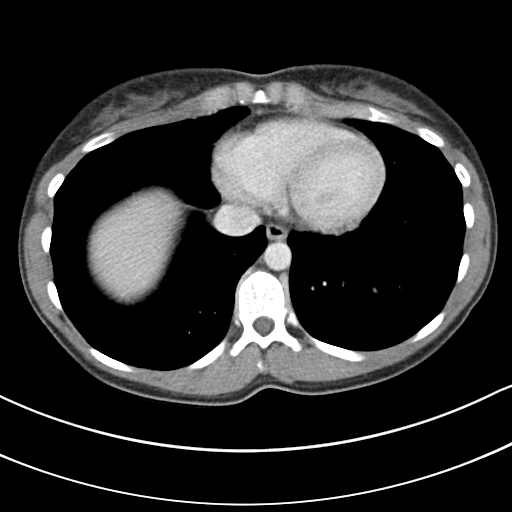

[Series 4: coronal a/|p · coronal · 0.72mm/px · 3 of 74 slices shown]
[im 25/74  soft-tissue]
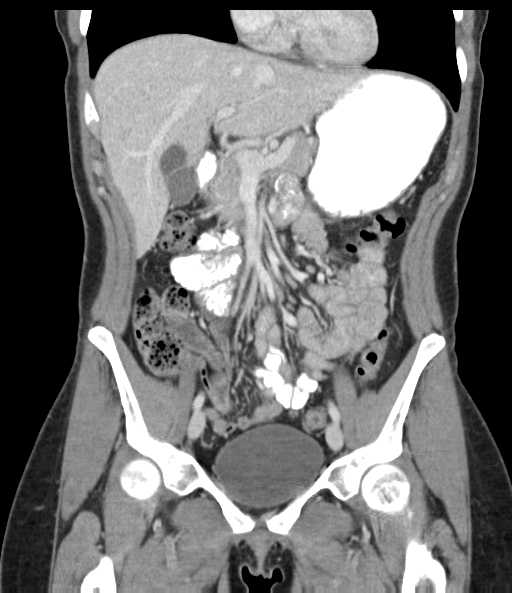
[im 33/74  soft-tissue]
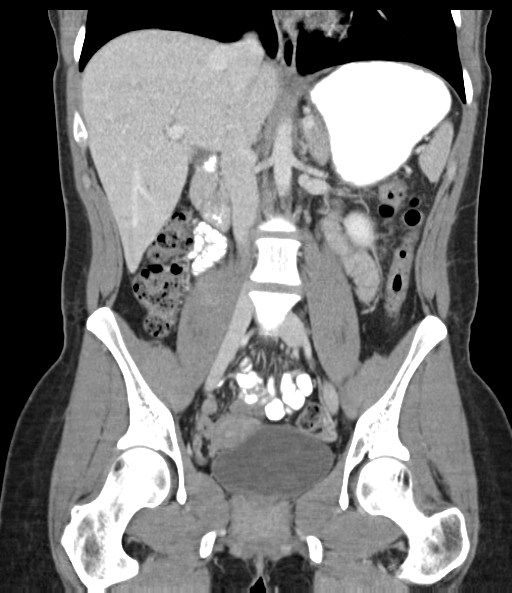
[im 41/74  soft-tissue]
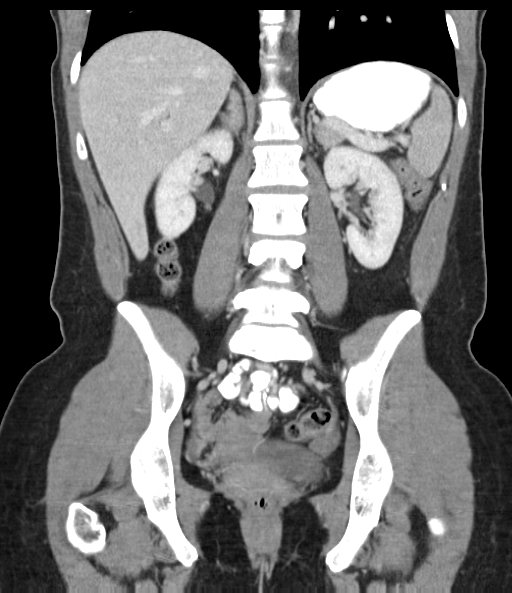

[17 of 46 positions shown; findings below may reference images not displayed]

FINDINGS: The visualized lung bases are clear.

There is minimal periportal edema. A subcentimeter low-density
lesion in the inferior right hepatic lobe is too small to fully
characterize. The gallbladder, spleen, adrenal glands, left kidney,
and pancreas are unremarkable. There is a 3 mm nonobstructing
calculus in the interpolar right kidney.

Oral contrast is present in multiple nondilated loops of small
bowel. There is no evidence of bowel obstruction or inflammation.
The appendix is identified in the right lower quadrant and is
unremarkable.

The bladder is mildly distended and unremarkable. The uterus and
ovaries are grossly unremarkable. No free fluid or enlarged lymph
nodes are identified. No abdominal wall hernia is seen. No acute
osseous abnormality is identified.
IMPRESSION: 1. Punctate, nonobstructing right renal calculus.
2. Trace periportal edema, nonspecific.

## 2017-03-17 ENCOUNTER — Other Ambulatory Visit: Payer: Self-pay | Admitting: Orthopedic Surgery

## 2017-03-17 DIAGNOSIS — M25561 Pain in right knee: Secondary | ICD-10-CM

## 2017-03-23 ENCOUNTER — Ambulatory Visit
Admission: RE | Admit: 2017-03-23 | Discharge: 2017-03-23 | Disposition: A | Discharge status: Home / Self Care | Payer: BC Managed Care – PPO | Source: Ambulatory Visit | Attending: Orthopedic Surgery | Admitting: Orthopedic Surgery

## 2017-03-23 DIAGNOSIS — M25561 Pain in right knee: Secondary | ICD-10-CM

## 2017-03-25 ENCOUNTER — Other Ambulatory Visit: Payer: Self-pay | Admitting: Family Medicine

## 2017-04-15 ENCOUNTER — Encounter (HOSPITAL_BASED_OUTPATIENT_CLINIC_OR_DEPARTMENT_OTHER): Payer: Self-pay | Admitting: *Deleted

## 2017-04-15 ENCOUNTER — Other Ambulatory Visit: Payer: Self-pay

## 2017-04-15 ENCOUNTER — Inpatient Hospital Stay (HOSPITAL_BASED_OUTPATIENT_CLINIC_OR_DEPARTMENT_OTHER)
Admission: EM | Admit: 2017-04-15 | Discharge: 2017-04-18 | DRG: 392 | Disposition: A | Discharge status: Home / Self Care | Payer: BC Managed Care – PPO | Attending: Internal Medicine | Admitting: Internal Medicine

## 2017-04-15 ENCOUNTER — Emergency Department (HOSPITAL_BASED_OUTPATIENT_CLINIC_OR_DEPARTMENT_OTHER): Payer: BC Managed Care – PPO

## 2017-04-15 DIAGNOSIS — Z825 Family history of asthma and other chronic lower respiratory diseases: Secondary | ICD-10-CM

## 2017-04-15 DIAGNOSIS — Z833 Family history of diabetes mellitus: Secondary | ICD-10-CM

## 2017-04-15 DIAGNOSIS — K529 Noninfective gastroenteritis and colitis, unspecified: Secondary | ICD-10-CM | POA: Diagnosis present

## 2017-04-15 DIAGNOSIS — Z8711 Personal history of peptic ulcer disease: Secondary | ICD-10-CM

## 2017-04-15 DIAGNOSIS — K625 Hemorrhage of anus and rectum: Secondary | ICD-10-CM

## 2017-04-15 DIAGNOSIS — Z8249 Family history of ischemic heart disease and other diseases of the circulatory system: Secondary | ICD-10-CM

## 2017-04-15 DIAGNOSIS — Z793 Long term (current) use of hormonal contraceptives: Secondary | ICD-10-CM

## 2017-04-15 DIAGNOSIS — K921 Melena: Secondary | ICD-10-CM | POA: Diagnosis present

## 2017-04-15 DIAGNOSIS — K279 Peptic ulcer, site unspecified, unspecified as acute or chronic, without hemorrhage or perforation: Secondary | ICD-10-CM | POA: Clinically undetermined

## 2017-04-15 DIAGNOSIS — K219 Gastro-esophageal reflux disease without esophagitis: Secondary | ICD-10-CM | POA: Diagnosis present

## 2017-04-15 DIAGNOSIS — Z882 Allergy status to sulfonamides status: Secondary | ICD-10-CM

## 2017-04-15 HISTORY — DX: Gastro-esophageal reflux disease without esophagitis: K21.9

## 2017-04-15 HISTORY — DX: Peptic ulcer, site unspecified, unspecified as acute or chronic, without hemorrhage or perforation: K27.9

## 2017-04-15 LAB — LIPASE, BLOOD: LIPASE: 35 U/L (ref 11–51)

## 2017-04-15 LAB — URINALYSIS, ROUTINE W REFLEX MICROSCOPIC
Bilirubin Urine: NEGATIVE
GLUCOSE, UA: NEGATIVE mg/dL
KETONES UR: NEGATIVE mg/dL
Leukocytes, UA: NEGATIVE
Nitrite: NEGATIVE
PROTEIN: NEGATIVE mg/dL
Specific Gravity, Urine: 1.025 (ref 1.005–1.030)
pH: 6 (ref 5.0–8.0)

## 2017-04-15 LAB — COMPREHENSIVE METABOLIC PANEL
ALK PHOS: 29 U/L — AB (ref 38–126)
ALT: 15 U/L (ref 14–54)
AST: 20 U/L (ref 15–41)
Albumin: 4.3 g/dL (ref 3.5–5.0)
Anion gap: 10 (ref 5–15)
BUN: 15 mg/dL (ref 6–20)
CALCIUM: 8.9 mg/dL (ref 8.9–10.3)
CO2: 23 mmol/L (ref 22–32)
CREATININE: 0.86 mg/dL (ref 0.44–1.00)
Chloride: 106 mmol/L (ref 101–111)
GFR calc Af Amer: >60 mL/min (ref >60)
Glucose, Bld: 99 mg/dL (ref 65–99)
Potassium: 3.7 mmol/L (ref 3.5–5.1)
Sodium: 139 mmol/L (ref 135–145)
TOTAL PROTEIN: 7.3 g/dL (ref 6.5–8.1)
Total Bilirubin: 0.6 mg/dL (ref 0.3–1.2)

## 2017-04-15 LAB — CBC WITH DIFFERENTIAL/PLATELET
BASOS ABS: 0 10*3/uL (ref 0.0–0.1)
Basophils Relative: 0 %
Eosinophils Absolute: 0.2 10*3/uL (ref 0.0–0.7)
Eosinophils Relative: 1 %
HEMATOCRIT: 44.6 % (ref 36.0–46.0)
Hemoglobin: 15.1 g/dL — ABNORMAL HIGH (ref 12.0–15.0)
LYMPHS PCT: 10 %
Lymphs Abs: 1.4 10*3/uL (ref 0.7–4.0)
MCH: 30.4 pg (ref 26.0–34.0)
MCHC: 33.9 g/dL (ref 30.0–36.0)
MCV: 89.9 fL (ref 78.0–100.0)
MONO ABS: 0.9 10*3/uL (ref 0.1–1.0)
Monocytes Relative: 7 %
NEUTROS ABS: 10.9 10*3/uL — AB (ref 1.7–7.7)
Neutrophils Relative %: 82 %
Platelets: 384 10*3/uL (ref 150–400)
RBC: 4.96 MIL/uL (ref 3.87–5.11)
RDW: 12.8 % (ref 11.5–15.5)
WBC: 13.4 10*3/uL — ABNORMAL HIGH (ref 4.0–10.5)

## 2017-04-15 LAB — URINALYSIS, MICROSCOPIC (REFLEX)

## 2017-04-15 LAB — PREGNANCY, URINE: Preg Test, Ur: NEGATIVE

## 2017-04-15 LAB — OCCULT BLOOD X 1 CARD TO LAB, STOOL: FECAL OCCULT BLD: POSITIVE — AB

## 2017-04-15 MED ORDER — CIPROFLOXACIN HCL 500 MG PO TABS
500.0000 mg | ORAL_TABLET | Freq: Once | ORAL | Status: AC
  Administered 2017-04-15: 500 mg via ORAL
  Filled 2017-04-15: qty 1

## 2017-04-15 MED ORDER — SODIUM CHLORIDE 0.9 % IV BOLUS
1000.0000 mL | Freq: Once | INTRAVENOUS | Status: AC
  Administered 2017-04-15: 1000 mL via INTRAVENOUS

## 2017-04-15 MED ORDER — IOPAMIDOL (ISOVUE-300) INJECTION 61%
100.0000 mL | Freq: Once | INTRAVENOUS | Status: AC | PRN
  Administered 2017-04-15: 100 mL via INTRAVENOUS

## 2017-04-15 MED ORDER — METRONIDAZOLE 500 MG PO TABS
500.0000 mg | ORAL_TABLET | Freq: Once | ORAL | Status: AC
  Administered 2017-04-15: 500 mg via ORAL
  Filled 2017-04-15: qty 1

## 2017-04-15 NOTE — ED Notes (Signed)
Paged hospitalist via [employer] per request from Dr. Samson Fredericouture @ 11:52 pm

## 2017-04-15 NOTE — ED Provider Notes (Signed)
[facility] EMERGENCY DEPARTMENT Provider Note   CSN: [id number] Arrival date & time: 04/15/17  2024     History   Chief Complaint Chief Complaint  Patient presents with  . Rectal Bleeding    HPI Tonya Lee is a 32 y.o. female.  HPI   Patient is a 32 year old female with history of migraines, PUD, GERD who presents the ED today complaining of bright red blood per rectum which began around 12:30 PM this morning.  States she had about 4-5 episodes of bright red blood while she was at home today.  Prior to episodes beginning she had bilateral lower abdominal pain that was cramping in nature.  States pain was 10 out of 10 and severe.  It began suddenly and brought her to her knees.  States the pain is improved since onset and she now only has 2/10 pain.  She reports nausea but no vomiting.  Denies any fevers.  Denies any recent constipation.  States she is never had any symptoms like this before.  Denies any urinary or vaginal symptoms.  No vaginal bleeding or hematuria.  States she takes 40 mg Pentothal daily for history of peptic ulcer disease.  Has not taken it since yesterday.  States she recently had right knee arthroscopy on 03/13/17.  Was discharged on narcotic pain medicine as well as ibuprofen every 6 hours.  Past Medical History:  Diagnosis Date  . 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 Migraines   . Peptic ulcer disease     Patient Active Problem List   Diagnosis Date Noted  . Colitis 04/16/2017  . GERD (gastroesophageal reflux disease) 02/28/2015    Past Surgical History:  Procedure Laterality Date  . ANKLE SURGERY Right july 2013  . FASCIECTOMY Right    right lower leg  . KNEE SURGERY       OB History   None      Home Medications    Prior to Admission medications   Medication Sig Start Date End Date Taking? Authorizing Provider  ibuprofen (ADVIL,MOTRIN) 200 MG tablet Take 3 tablets (600 mg total) by mouth every 6 (six) hours  as needed for fever or moderate pain. 11/03/16  Yes Stacks, Broadus John, MD  metoprolol tartrate (LOPRESSOR) 25 MG tablet TAKE 1 TABLET (25 MG TOTAL) 2 (TWO) TIMES DAILY BY MOUTH. MUST BE SEEN FOR FURTHER REFILL 03/25/17  Yes Dettinger, Elige Radon, MD  norgestrel-ethinyl estradiol (CRYSELLE-28) 0.3-30 MG-MCG tablet Take 1 tablet by mouth daily.   Yes [provider]  ondansetron (ZOFRAN-ODT) 8 MG disintegrating tablet Take 1 tablet (8 mg total) by mouth every 6 (six) hours as needed for nausea or vomiting. 11/03/16  Yes Mechele Claude, MD  pantoprazole (PROTONIX) 40 MG tablet Take 1 tablet (40 mg total) by mouth 2 (two) times daily. 10/29/16  Yes Dettinger, Elige Radon, MD  promethazine (PHENERGAN) 25 MG tablet TAKE 1 TABLET (25 MG TOTAL) BY MOUTH EVERY 8 (EIGHT) HOURS AS NEEDED FOR NAUSEA OR VOMITING. 10/01/16  Yes Dettinger, Elige Radon, MD  albuterol (PROVENTIL HFA;VENTOLIN HFA) 108 (90 Base) MCG/ACT inhaler Inhale 2 puffs into the lungs every 6 (six) hours as needed for wheezing or shortness of breath. 04/02/15   Dettinger, Elige Radon, MD  frovatriptan (FROVA) 2.5 MG tablet Take 1 tablet (2.5 mg total) by mouth as needed for migraine. If recurs, may repeat after 2 hours. Max of 3 tabs in 24 hours. 09/14/16   Elenora Gamma, MD    Family  History Family History  Problem Relation Age of Onset  . Asthma Mother   . Hyperlipidemia Maternal Grandmother   . Hypertension Maternal Grandmother   . Asthma Maternal Grandmother   . Heart disease Maternal Grandfather   . Cancer Maternal Grandfather        thyroid  . Diabetes Maternal Grandfather   . Heart disease Paternal Grandfather     Social History Social History   Tobacco Use  . Smoking status: Never Smoker  . Smokeless tobacco: Never Used  Substance Use Topics  . Alcohol use: No  . Drug use: No     Allergies   Sulfa antibiotics; Acyclovir and related; Prednisone; and Nitrofuran derivatives   Review of Systems Review of Systems    Constitutional: Positive for appetite change. Negative for chills and fever.  HENT: Negative for ear pain and sore throat.   Eyes: Negative for pain and visual disturbance.  Respiratory: Negative for cough and shortness of breath.   Cardiovascular: Negative for chest pain and palpitations.  Gastrointestinal: Positive for abdominal pain, blood in stool, diarrhea and nausea. Negative for constipation and vomiting.  Genitourinary: Negative for dysuria, flank pain, hematuria and urgency.  Musculoskeletal: Negative for arthralgias and back pain.  Skin: Negative for color change and rash.  Neurological: Negative for dizziness, light-headedness and headaches.  All other systems reviewed and are negative.    Physical Exam Updated Vital Signs BP 138/88 (BP Location: Right Arm)   Pulse 88   Temp 98.2 F (36.8 C) (Oral)   Resp 16   Ht 5\' 5"  (1.651 m)   Wt 61.2 kg (135 lb)   LMP 03/25/2017   SpO2 99%   BMI 22.47 kg/m   Physical Exam  Constitutional: She appears well-developed and well-nourished. No distress.  HENT:  Head: Normocephalic and atraumatic.  Eyes: Conjunctivae are normal.  Neck: Neck supple.  Cardiovascular: Normal rate, regular rhythm, normal heart sounds and intact distal pulses.  No murmur heard. Pulmonary/Chest: Effort normal and breath sounds normal. No respiratory distress. She has no wheezes.  Abdominal: Soft. Bowel sounds are normal. There is no tenderness.  Patient with periumbilical abdominal pain bilaterally.  No pelvic pain with palpation.  No distention, no guarding, no rebound tenderness.  Genitourinary:  Genitourinary Comments: Exam performed with chaperone present. Digital Rectal Exam reveals sphincter with good tone. No external hemorrhoids. No masses or fissures.  No stool noted on exam.  No overt blood noted.    Musculoskeletal: She exhibits no edema.  Neurological: She is alert.  Skin: Skin is warm and dry. Capillary refill takes less than 2 seconds.   Psychiatric: She has a normal mood and affect.  Nursing note and vitals reviewed.    ED Treatments / Results  Labs (all labs ordered are listed, but only abnormal results are displayed) Labs Reviewed  CBC WITH DIFFERENTIAL/PLATELET - Abnormal; Notable for the following components:      Result Value   WBC 13.4 (*)    Hemoglobin 15.1 (*)    Neutro Abs 10.9 (*)    All other components within normal limits  COMPREHENSIVE METABOLIC PANEL - Abnormal; Notable for the following components:   Alkaline Phosphatase 29 (*)    All other components within normal limits  URINALYSIS, ROUTINE W REFLEX MICROSCOPIC - Abnormal; Notable for the following components:   Hgb urine dipstick TRACE (*)    All other components within normal limits  URINALYSIS, MICROSCOPIC (REFLEX) - Abnormal; Notable for the following components:   Bacteria, UA RARE (*)  Squamous Epithelial / LPF 6-30 (*)    All other components within normal limits  OCCULT BLOOD X 1 CARD TO LAB, STOOL - Abnormal; Notable for the following components:   Fecal Occult Bld POSITIVE (*)    All other components within normal limits  LIPASE, BLOOD  PREGNANCY, URINE    EKG None  Radiology Ct Abdomen Pelvis W Contrast  Result Date: 04/15/2017 CLINICAL DATA:  Bloody diarrhea x5 today. EXAM: CT ABDOMEN AND PELVIS WITH CONTRAST TECHNIQUE: Multidetector CT imaging of the abdomen and pelvis was performed using the standard protocol following bolus administration of intravenous contrast. CONTRAST:  100mL ISOVUE-300 IOPAMIDOL (ISOVUE-300) INJECTION 61% COMPARISON:  01/30/2015 FINDINGS: Lower chest: Normal size heart without pericardial effusion. Clear lung bases. Hepatobiliary: Homogeneous attenuation of the liver. No space-occupying mass. Physiologic distention of the gallbladder with small fold at the fundus. No biliary dilatation. Pancreas: Normal Spleen: Normal Adrenals/Urinary Tract: Punctate interpolar right renal calculus. No hydronephrosis nor  hydroureter. No space-occupying mass of either kidney. Normal bilateral adrenal glands. Unremarkable bladder for degree of distention. Stomach/Bowel: Moderate to marked inflammatory thickening of the colon from distal transverse colon through rectum consistent with proctocolitis. No bowel obstruction. Normal appendix. Normal small bowel rotation. Nondistended stomach. Vascular/Lymphatic: No lymphadenopathy. No aortic aneurysm or dissection. Reproductive: Unremarkable uterus and adnexa. Other: Trace free fluid in the pelvis.  No free air. Musculoskeletal: No suspicious osseous abnormality. IMPRESSION: 1. Diffuse transmural thickening of the colon from distal transverse colon through rectum consistent with postinfectious or inflammatory proctocolitis. No bowel obstruction, free air nor abscess. 2. Punctate right interpolar renal calculus without obstruction. Electronically Signed   By: Tollie Ethavid  Kwon M.D.   On: 04/15/2017 23:03    Procedures Procedures (including critical care time)  Medications Ordered in ED Medications  morphine 4 MG/ML injection 4 mg (has no administration in time range)  iopamidol (ISOVUE-300) 61 % injection 100 mL (100 mLs Intravenous Contrast Given 04/15/17 2239)  ciprofloxacin (CIPRO) tablet 500 mg (500 mg Oral Given 04/15/17 2349)  metroNIDAZOLE (FLAGYL) tablet 500 mg (500 mg Oral Given 04/15/17 2349)  sodium chloride 0.9 % bolus 1,000 mL (1,000 mLs Intravenous New Bag/Given 04/15/17 2350)     Initial Impression / Assessment and Plan / ED Course  I have reviewed the triage vital signs and the nursing notes.  Pertinent labs & imaging results that were available during my care of the patient were reviewed by me and considered in my medical decision making (see chart for details).   Recheck patient.  States that her abdominal pain improved.  She has had one episode of bright red blood per rectum.  Recheck patiented.  States she is experiencing abdominal pain again.  She has had an  additional episode of bright red blood per rectum.  12:30 PM CONSULT with Dr. Adela Glimpseoutova who will admit the pt to the hospitalist service.   Final Clinical Impressions(s) / ED Diagnoses   Final diagnoses:  Colitis  Rectal bleeding   Patient with 6-7 episodes of bright red blood per rectum today after starting on ibuprofen 3 days ago. Bleeding likely lower GI in nature. Hemoglobin stable here at 15.  CBC with leukocytosis of 13.4.  Lipase negative.  Hemoccult positive for blood.  Vital signs stable and patient nontoxic-appearing. UA without evidence of infection.  Pregnancy test negative.  CT scan with findings suggestive of colitis.  Gave Cipro and Flagyl.  Fluid bolus given as well as pain medication.  Patient will be admitted to hospital service for further  observation, fluid hydration, and IV antibiotics for colitis.  Patient comfortable with plan for admission.  She will be transferred to [facility] for admission.   ED Discharge Orders    None       Karrie Meres, [place] 04/16/17 0044    Terrilee Files, MD 04/16/17 1124

## 2017-04-15 NOTE — ED Triage Notes (Signed)
Patient reports that this afternoon she has had 5 episodes of diarrhea with bright red blood and nausea. States that she just had knee surgery on Wednesday and has been taking ibuprofen and oxycodone for the pain. Hx of stomach ulcers.

## 2017-04-16 ENCOUNTER — Encounter (HOSPITAL_COMMUNITY): Payer: Self-pay

## 2017-04-16 DIAGNOSIS — Z793 Long term (current) use of hormonal contraceptives: Secondary | ICD-10-CM | POA: Diagnosis not present

## 2017-04-16 DIAGNOSIS — Z8711 Personal history of peptic ulcer disease: Secondary | ICD-10-CM | POA: Diagnosis not present

## 2017-04-16 DIAGNOSIS — Z8249 Family history of ischemic heart disease and other diseases of the circulatory system: Secondary | ICD-10-CM | POA: Diagnosis not present

## 2017-04-16 DIAGNOSIS — K529 Noninfective gastroenteritis and colitis, unspecified: Secondary | ICD-10-CM | POA: Diagnosis present

## 2017-04-16 DIAGNOSIS — K625 Hemorrhage of anus and rectum: Secondary | ICD-10-CM

## 2017-04-16 DIAGNOSIS — K921 Melena: Secondary | ICD-10-CM | POA: Diagnosis present

## 2017-04-16 DIAGNOSIS — Z825 Family history of asthma and other chronic lower respiratory diseases: Secondary | ICD-10-CM | POA: Diagnosis not present

## 2017-04-16 DIAGNOSIS — K219 Gastro-esophageal reflux disease without esophagitis: Secondary | ICD-10-CM | POA: Diagnosis present

## 2017-04-16 DIAGNOSIS — Z833 Family history of diabetes mellitus: Secondary | ICD-10-CM | POA: Diagnosis not present

## 2017-04-16 DIAGNOSIS — Z882 Allergy status to sulfonamides status: Secondary | ICD-10-CM | POA: Diagnosis not present

## 2017-04-16 DIAGNOSIS — K279 Peptic ulcer, site unspecified, unspecified as acute or chronic, without hemorrhage or perforation: Secondary | ICD-10-CM | POA: Diagnosis not present

## 2017-04-16 LAB — C DIFFICILE QUICK SCREEN W PCR REFLEX
C DIFFICILE (CDIFF) TOXIN: NEGATIVE
C DIFFICLE (CDIFF) ANTIGEN: NEGATIVE
C Diff interpretation: NOT DETECTED

## 2017-04-16 LAB — HEMOGLOBIN AND HEMATOCRIT, BLOOD
HEMATOCRIT: 39.3 % (ref 36.0–46.0)
Hemoglobin: 12.9 g/dL (ref 12.0–15.0)

## 2017-04-16 LAB — GASTROINTESTINAL PANEL BY PCR, STOOL (REPLACES STOOL CULTURE)

## 2017-04-16 LAB — HIV ANTIBODY (ROUTINE TESTING W REFLEX): HIV SCREEN 4TH GENERATION: NONREACTIVE

## 2017-04-16 MED ORDER — NORGESTREL-ETHINYL ESTRADIOL 0.3-30 MG-MCG PO TABS: 1.0000 | ORAL_TABLET | Freq: Every day | ORAL | Status: DC

## 2017-04-16 MED ORDER — OXYCODONE-ACETAMINOPHEN 5-325 MG PO TABS: 1.0000 | ORAL_TABLET | Freq: Four times a day (QID) | ORAL | Status: DC | PRN

## 2017-04-16 MED ORDER — SODIUM CHLORIDE 0.9 % IV SOLN
INTRAVENOUS | Status: DC
  Administered 2017-04-16 – 2017-04-18 (×2): via INTRAVENOUS

## 2017-04-16 MED ORDER — METOPROLOL TARTRATE 25 MG PO TABS
25.0000 mg | ORAL_TABLET | Freq: Two times a day (BID) | ORAL | Status: DC
  Administered 2017-04-18: 25 mg via ORAL
  Filled 2017-04-16 (×3): qty 1

## 2017-04-16 MED ORDER — MORPHINE SULFATE (PF) 4 MG/ML IV SOLN
2.0000 mg | INTRAVENOUS | Status: DC | PRN
  Administered 2017-04-16: 4 mg via INTRAVENOUS
  Administered 2017-04-16 (×2): 2 mg via INTRAVENOUS
  Filled 2017-04-16 (×4): qty 1

## 2017-04-16 MED ORDER — ACETAMINOPHEN 325 MG PO TABS: 650.0000 mg | ORAL_TABLET | Freq: Four times a day (QID) | ORAL | Status: DC | PRN

## 2017-04-16 MED ORDER — ONDANSETRON HCL 4 MG PO TABS
4.0000 mg | ORAL_TABLET | Freq: Four times a day (QID) | ORAL | Status: DC | PRN
  Administered 2017-04-16 – 2017-04-18 (×2): 4 mg via ORAL
  Filled 2017-04-16 (×2): qty 1

## 2017-04-16 MED ORDER — DIPHENHYDRAMINE HCL 50 MG/ML IJ SOLN
25.0000 mg | Freq: Four times a day (QID) | INTRAMUSCULAR | Status: DC | PRN
  Administered 2017-04-17: 25 mg via INTRAVENOUS
  Filled 2017-04-16: qty 1

## 2017-04-16 MED ORDER — ONDANSETRON 4 MG PO TBDP: 8.0000 mg | ORAL_TABLET | Freq: Four times a day (QID) | ORAL | Status: DC | PRN

## 2017-04-16 MED ORDER — MORPHINE SULFATE (PF) 4 MG/ML IV SOLN
4.0000 mg | Freq: Once | INTRAVENOUS | Status: AC
  Administered 2017-04-16: 4 mg via INTRAVENOUS
  Filled 2017-04-16: qty 1

## 2017-04-16 MED ORDER — METRONIDAZOLE IN NACL 5-0.79 MG/ML-% IV SOLN
500.0000 mg | Freq: Three times a day (TID) | INTRAVENOUS | Status: DC
  Administered 2017-04-16 – 2017-04-18 (×6): 500 mg via INTRAVENOUS
  Filled 2017-04-16 (×6): qty 100

## 2017-04-16 MED ORDER — CIPROFLOXACIN IN D5W 400 MG/200ML IV SOLN
400.0000 mg | Freq: Two times a day (BID) | INTRAVENOUS | Status: DC
  Administered 2017-04-16 – 2017-04-18 (×4): 400 mg via INTRAVENOUS
  Filled 2017-04-16 (×4): qty 200

## 2017-04-16 MED ORDER — METRONIDAZOLE 500 MG PO TABS
500.0000 mg | ORAL_TABLET | Freq: Three times a day (TID) | ORAL | Status: DC
  Administered 2017-04-16: 500 mg via ORAL
  Filled 2017-04-16: qty 1

## 2017-04-16 MED ORDER — ALBUTEROL SULFATE (2.5 MG/3ML) 0.083% IN NEBU: 3.0000 mL | INHALATION_SOLUTION | Freq: Four times a day (QID) | RESPIRATORY_TRACT | Status: DC | PRN

## 2017-04-16 MED ORDER — CIPROFLOXACIN HCL 500 MG PO TABS
500.0000 mg | ORAL_TABLET | Freq: Two times a day (BID) | ORAL | Status: DC
  Administered 2017-04-16: 500 mg via ORAL
  Filled 2017-04-16: qty 1

## 2017-04-16 MED ORDER — ONDANSETRON HCL 4 MG/2ML IJ SOLN
4.0000 mg | Freq: Four times a day (QID) | INTRAMUSCULAR | Status: DC | PRN
  Administered 2017-04-16 – 2017-04-17 (×3): 4 mg via INTRAVENOUS
  Filled 2017-04-16 (×3): qty 2

## 2017-04-16 MED ORDER — ACETAMINOPHEN 650 MG RE SUPP: 650.0000 mg | Freq: Four times a day (QID) | RECTAL | Status: DC | PRN

## 2017-04-16 MED ORDER — IBUPROFEN 200 MG PO TABS: 600.0000 mg | ORAL_TABLET | Freq: Four times a day (QID) | ORAL | Status: DC | PRN

## 2017-04-16 MED ORDER — PANTOPRAZOLE SODIUM 40 MG PO TBEC
40.0000 mg | DELAYED_RELEASE_TABLET | Freq: Two times a day (BID) | ORAL | Status: DC
  Administered 2017-04-16 – 2017-04-18 (×5): 40 mg via ORAL
  Filled 2017-04-16 (×5): qty 1

## 2017-04-16 NOTE — ED Notes (Signed)
Called [facility] and requested hospitalist be paged due to no response.  [facility] advised they will repage.  12:24 am

## 2017-04-16 NOTE — Progress Notes (Addendum)
Patient is admitted this am for colitis She does not have fever, vital stable, continue to feel nauseous, ab pain not well controlled. Bloody stool seems improving, it was all dark blood earlier, now clear liquid with small  fresh blood clot. hgb stable. Change abx from oral to iv, continue ivf, add percocet, she report iv morphine does not last long. c diff negative, gi pcr panel in process Consider gi consult if not improving. Family updated at bedside.

## 2017-04-16 NOTE — H&P (Signed)
History and Physical    Tonya Lee ZHY:[id number] DOB: Aug 13, 1985 DOA: 04/15/2017  PCP: Dettinger, Elige Radon, MD  Patient coming from: Home  I have personally briefly reviewed patient's old medical records in [facility]  Chief Complaint: Rectal bleeding  HPI: Tonya Lee is a 32 y.o. female with medical history significant of PUD, GERD.  Patient had knee procedure x5 days ago, was given ABx for procedure and put on Ibuprofen after procedure.  Presents to ED with 4-5 episodes of BRBPR while at home today.  Associated bilateral lower abd pain, crampy, initially 10/10 but improved since then.   ED Course: CT shows colitis.  HGB 15.  No fever, no WBC.  No history of similar episodes, no h/o IBD.   Review of Systems: As per HPI otherwise 10 point review of systems negative.   Past Medical History:  Diagnosis Date  . 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 GERD (gastroesophageal reflux disease)   . Migraines   . Peptic ulcer disease     Past Surgical History:  Procedure Laterality Date  . ANKLE SURGERY Right july 2013  . FASCIECTOMY Right    right lower leg  . KNEE SURGERY       reports that she has never smoked. She has never used smokeless tobacco. She reports that she does not drink alcohol or use drugs.  Allergies  Allergen Reactions  . Sulfa Antibiotics Anaphylaxis, Hives and Other (See Comments)    Throat and face swelled  . Acyclovir And Related Hives  . Prednisone Nausea And Vomiting  . Nitrofuran Derivatives Nausea Only    Abdominal pain    Family History  Problem Relation Age of Onset  . Asthma Mother   . Hyperlipidemia Maternal Grandmother   . Hypertension Maternal Grandmother   . Asthma Maternal Grandmother   . Heart disease Maternal Grandfather   . Cancer Maternal Grandfather        thyroid  . Diabetes Maternal Grandfather   . Heart disease Paternal Grandfather      Prior to Admission medications   Medication Sig  Start Date End Date Taking? Authorizing Provider  albuterol (PROVENTIL HFA;VENTOLIN HFA) 108 (90 Base) MCG/ACT inhaler Inhale 2 puffs into the lungs every 6 (six) hours as needed for wheezing or shortness of breath. 04/02/15  Yes Dettinger, Elige Radon, MD  frovatriptan (FROVA) 2.5 MG tablet Take 1 tablet (2.5 mg total) by mouth as needed for migraine. If recurs, may repeat after 2 hours. Max of 3 tabs in 24 hours. 09/14/16  Yes Elenora Gamma, MD  ibuprofen (ADVIL,MOTRIN) 200 MG tablet Take 3 tablets (600 mg total) by mouth every 6 (six) hours as needed for fever or moderate pain. 11/03/16  Yes Stacks, Broadus John, MD  metoprolol tartrate (LOPRESSOR) 25 MG tablet TAKE 1 TABLET (25 MG TOTAL) 2 (TWO) TIMES DAILY BY MOUTH. MUST BE SEEN FOR FURTHER REFILL 03/25/17  Yes Dettinger, Elige Radon, MD  norgestrel-ethinyl estradiol (CRYSELLE-28) 0.3-30 MG-MCG tablet Take 1 tablet by mouth daily.   Yes [provider]  ondansetron (ZOFRAN-ODT) 8 MG disintegrating tablet Take 1 tablet (8 mg total) by mouth every 6 (six) hours as needed for nausea or vomiting. 11/03/16  Yes Mechele Claude, MD  pantoprazole (PROTONIX) 40 MG tablet Take 1 tablet (40 mg total) by mouth 2 (two) times daily. 10/29/16  Yes Dettinger, Elige Radon, MD  promethazine (PHENERGAN) 25 MG tablet TAKE 1 TABLET (25 MG TOTAL) BY MOUTH EVERY 8 (EIGHT) HOURS  AS NEEDED FOR NAUSEA OR VOMITING. 10/01/16  Yes Dettinger, Elige Radon, MD    Physical Exam: Vitals:   04/15/17 2030 04/16/17 0000 04/16/17 0030 04/16/17 0218  BP:  122/82 130/82 128/84  Pulse:    73  Resp:    14  Temp:    98.3 F (36.8 C)  TempSrc:    Oral  SpO2:    99%  Weight: 61.2 kg (135 lb)   60.8 kg (134 lb 0.6 oz)  Height: 5\' 5"  (1.651 m)   5\' 5"  (1.651 m)    Constitutional: NAD, calm, comfortable Eyes: PERRL, lids and conjunctivae normal ENMT: Mucous membranes are moist. Posterior pharynx clear of any exudate or lesions.Normal dentition.  Neck: normal, supple, no masses, no  thyromegaly Respiratory: clear to auscultation bilaterally, no wheezing, no crackles. Normal respiratory effort. No accessory muscle use.  Cardiovascular: Regular rate and rhythm, no murmurs / rubs / gallops. No extremity edema. 2+ pedal pulses. No carotid bruits.  Abdomen: periumbilical tenderness, no rebound, no guarding Musculoskeletal: no clubbing / cyanosis. No joint deformity upper and lower extremities. Good ROM, no contractures. Normal muscle tone.  Skin: no rashes, lesions, ulcers. No induration Neurologic: CN 2-12 grossly intact. Sensation intact, DTR normal. Strength 5/5 in all 4.  Psychiatric: Normal judgment and insight. Alert and oriented x 3. Normal mood.    Labs on Admission: I have personally reviewed following labs and imaging studies  CBC: Recent Labs  Lab 04/15/17 2124  WBC 13.4*  NEUTROABS 10.9*  HGB 15.1*  HCT 44.6  MCV 89.9  PLT 384   Basic Metabolic Panel: Recent Labs  Lab 04/15/17 2124  NA 139  K 3.7  CL 106  CO2 23  GLUCOSE 99  BUN 15  CREATININE 0.86  CALCIUM 8.9   GFR: Estimated Creatinine Clearance: 86.1 mL/min (by C-G formula based on SCr of 0.86 mg/dL). Liver Function Tests: Recent Labs  Lab 04/15/17 2124  AST 20  ALT 15  ALKPHOS 29*  BILITOT 0.6  PROT 7.3  ALBUMIN 4.3   Recent Labs  Lab 04/15/17 2124  LIPASE 35   No results for input(s): AMMONIA in the last 168 hours. Coagulation Profile: No results for input(s): INR, PROTIME in the last 168 hours. Cardiac Enzymes: No results for input(s): CKTOTAL, CKMB, CKMBINDEX, TROPONINI in the last 168 hours. BNP (last 3 results) No results for input(s): PROBNP in the last 8760 hours. HbA1C: No results for input(s): HGBA1C in the last 72 hours. CBG: No results for input(s): GLUCAP in the last 168 hours. Lipid Profile: No results for input(s): CHOL, HDL, LDLCALC, TRIG, CHOLHDL, LDLDIRECT in the last 72 hours. Thyroid Function Tests: No results for input(s): TSH, T4TOTAL, FREET4,  T3FREE, THYROIDAB in the last 72 hours. Anemia Panel: No results for input(s): VITAMINB12, FOLATE, FERRITIN, TIBC, IRON, RETICCTPCT in the last 72 hours. Urine analysis:    Component Value Date/Time   COLORURINE YELLOW 04/15/2017 2125   APPEARANCEUR CLEAR 04/15/2017 2125   APPEARANCEUR Clear 02/03/2016 0845   LABSPEC 1.025 04/15/2017 2125   PHURINE 6.0 04/15/2017 2125   GLUCOSEU NEGATIVE 04/15/2017 2125   HGBUR TRACE (A) 04/15/2017 2125   BILIRUBINUR NEGATIVE 04/15/2017 2125   BILIRUBINUR Negative 02/03/2016 0845   KETONESUR NEGATIVE 04/15/2017 2125   PROTEINUR NEGATIVE 04/15/2017 2125   UROBILINOGEN negative 01/25/2015 0907   NITRITE NEGATIVE 04/15/2017 2125   LEUKOCYTESUR NEGATIVE 04/15/2017 2125   LEUKOCYTESUR 1+ (A) 02/03/2016 0845    Radiological Exams on Admission: Ct Abdomen Pelvis W Contrast  Result Date: 04/15/2017 CLINICAL DATA:  Bloody diarrhea x5 today. EXAM: CT ABDOMEN AND PELVIS WITH CONTRAST TECHNIQUE: Multidetector CT imaging of the abdomen and pelvis was performed using the standard protocol following bolus administration of intravenous contrast. CONTRAST:  100mL ISOVUE-300 IOPAMIDOL (ISOVUE-300) INJECTION 61% COMPARISON:  01/30/2015 FINDINGS: Lower chest: Normal size heart without pericardial effusion. Clear lung bases. Hepatobiliary: Homogeneous attenuation of the liver. No space-occupying mass. Physiologic distention of the gallbladder with small fold at the fundus. No biliary dilatation. Pancreas: Normal Spleen: Normal Adrenals/Urinary Tract: Punctate interpolar right renal calculus. No hydronephrosis nor hydroureter. No space-occupying mass of either kidney. Normal bilateral adrenal glands. Unremarkable bladder for degree of distention. Stomach/Bowel: Moderate to marked inflammatory thickening of the colon from distal transverse colon through rectum consistent with proctocolitis. No bowel obstruction. Normal appendix. Normal small bowel rotation. Nondistended stomach.  Vascular/Lymphatic: No lymphadenopathy. No aortic aneurysm or dissection. Reproductive: Unremarkable uterus and adnexa. Other: Trace free fluid in the pelvis.  No free air. Musculoskeletal: No suspicious osseous abnormality. IMPRESSION: 1. Diffuse transmural thickening of the colon from distal transverse colon through rectum consistent with postinfectious or inflammatory proctocolitis. No bowel obstruction, free air nor abscess. 2. Punctate right interpolar renal calculus without obstruction. Electronically Signed   By: Tollie Ethavid  Kwon M.D.   On: 04/15/2017 23:03    EKG: Independently reviewed.  Assessment/Plan Active Problems:   Colitis    1. Colitis - presumed infectious 1. IVF: NS at 125 cc/hr 2. Continue cipro / flagyl started in ED 3. C.Diff and GI path pnl pending 4. Morphine PRN pain 5. Repeat HGB at 0600 6. Call GI in HGB drop in AM, or if colitis persists despite treatment.  DVT prophylaxis: SCDs - due to some blood in stool Code Status: Full Family Communication: Husband at bedside Disposition Plan: Home after admit Consults called: None Admission status: Admit to inpatient   Hillary BowGARDNER, JARED M. DO [facility] Pager [phone]  If 7AM-7PM, please contact day team taking care of patient [email or web address] Password [email or web address]  04/16/2017, 3:22 AM

## 2017-04-16 NOTE — Plan of Care (Signed)
[facility]     Rhona Katherina Rightenny 31 y.o. With  Past hx of PUD   Knee proceedure on Monday on ibuprofen for 5 days. Hx pf PUD on daily Protonix  Developed bloody diarrhea CT showing colitis.   Presents today with  Chief Complaint  Patient presents with  . Rectal Bleeding     Blood pressure 138/88, pulse 88, temperature 98.2 F (36.8 C), temperature source Oral, resp. rate 16, height 5\' 5"  (1.651 m), weight 61.2 kg (135 lb), last menstrual period 03/25/2017, SpO2 99 %.    Significant findings:   Hg 15    Radiology CT showing colitis   Started on Cipro and Flagyl  Plan to Admit   On [facility]  Lucielle Vokes 12:30 AM

## 2017-04-17 LAB — BASIC METABOLIC PANEL
Anion gap: 6 (ref 5–15)
BUN: 6 mg/dL (ref 6–20)
CALCIUM: 8.3 mg/dL — AB (ref 8.9–10.3)
CO2: 23 mmol/L (ref 22–32)
CREATININE: 0.82 mg/dL (ref 0.44–1.00)
Chloride: 110 mmol/L (ref 101–111)
GFR calc non Af Amer: >60 mL/min (ref >60)
Glucose, Bld: 87 mg/dL (ref 65–99)
Potassium: 3.8 mmol/L (ref 3.5–5.1)
SODIUM: 139 mmol/L (ref 135–145)

## 2017-04-17 LAB — CBC WITH DIFFERENTIAL/PLATELET
BASOS PCT: 0 %
Basophils Absolute: 0 10*3/uL (ref 0.0–0.1)
EOS ABS: 0.5 10*3/uL (ref 0.0–0.7)
Eosinophils Relative: 6 %
HCT: 38.6 % (ref 36.0–46.0)
Hemoglobin: 12.7 g/dL (ref 12.0–15.0)
Lymphocytes Relative: 30 %
Lymphs Abs: 2.5 10*3/uL (ref 0.7–4.0)
MCH: 29.7 pg (ref 26.0–34.0)
MCHC: 32.9 g/dL (ref 30.0–36.0)
MCV: 90.4 fL (ref 78.0–100.0)
MONO ABS: 0.6 10*3/uL (ref 0.1–1.0)
MONOS PCT: 7 %
Neutro Abs: 4.7 10*3/uL (ref 1.7–7.7)
Neutrophils Relative %: 57 %
Platelets: 325 10*3/uL (ref 150–400)
RBC: 4.27 MIL/uL (ref 3.87–5.11)
RDW: 12.5 % (ref 11.5–15.5)
WBC: 8.4 10*3/uL (ref 4.0–10.5)

## 2017-04-17 LAB — C-REACTIVE PROTEIN: CRP: 4.2 mg/dL — ABNORMAL HIGH (ref <1.0)

## 2017-04-17 LAB — SEDIMENTATION RATE: SED RATE: 5 mm/h (ref 0–22)

## 2017-04-17 LAB — MAGNESIUM: MAGNESIUM: 1.9 mg/dL (ref 1.7–2.4)

## 2017-04-17 MED ORDER — NON FORMULARY: 2.5000 mg | Freq: Two times a day (BID) | Status: DC | PRN

## 2017-04-17 MED ORDER — FROVATRIPTAN SUCCINATE 2.5 MG PO TABS: 2.5000 mg | ORAL_TABLET | Freq: Two times a day (BID) | ORAL | Status: DC | PRN

## 2017-04-17 NOTE — Progress Notes (Signed)
Provider ordered patient supplied Tonya BuffyFovatriptan is ok to take. [facility]. Patient took Fovatriptan  2.5mg  by mouth x 1 dose as home supplied medication. Will c/t monitor.

## 2017-04-17 NOTE — Progress Notes (Signed)
 PROGRESS NOTE  Helon Janowiak MRN:[id number] DOB: 12/26/1985 DOA: 04/15/2017 PCP: Dettinger, Joshua A, MD  HPI/Recap of past 24 hours:  Feeling better, now started on full liquid No fever, diarrhea has slowed down  She developed localized itchy during iv cipro infusion, resolved with infusion rate slowed down  Husband at bedside  Assessment/Plan: Active Problems:   Colitis  Colitis: -she presented with bright red blood per rectum x5 at home with ab pain, nausea, no vomiting, no fever, -ct ab/pel "Diffuse transmural thickening of the colon from distal transverse colon through rectum consistent with postinfectious or inflammatory proctocolitis. No bowel obstruction, free air nor abscess" -c diff negative, gi pcr panel negative. hgb stable. Esr wnl, crp mildly elevated at 4.2 -she is started on cipro/flagyl, ivf -improving, ab pain much improved, diarrhea almost resolved, now small amount of clear liquid with small light pinkish blood clot. -tolerating clears, advance diet as tolerated, likely able to d/c in am with outpatient gi follow up.  H/o PUD, on ppi  Knee proceedure on Monday on ibuprofen for 5 days.    Code Status: full  Family Communication: patient and husband  Disposition Plan: home when able to tolerate diet advancement, hopefully on 4/8   Consultants:  none  Procedures:  none  Antibiotics:  cipro/flagyl   Objective: BP 98/63 (BP Location: Right Arm)   Pulse 60   Temp 98.4 F (36.9 C) (Oral)   Resp 18   Ht 5' 5" (1.651 m)   Wt 60.8 kg (134 lb 0.6 oz)   LMP 03/25/2017   SpO2 99%   BMI 22.31 kg/m   Intake/Output Summary (Last 24 hours) at 04/17/2017 1417 Last data filed at 04/17/2017 0600 Gross per 24 hour  Intake 1800 ml  Output -  Net 1800 ml   Filed Weights   04/15/17 2030 04/16/17 0218  Weight: 61.2 kg (135 lb) 60.8 kg (134 lb 0.6 oz)    Exam: Patient is examined daily including today on 04/17/2017, exams remain the same as of  yesterday except that has changed    General:  NAD  Cardiovascular: RRR  Respiratory: CTABL  Abdomen: mild periumbilical tenderness, no guarding, no rebound, positive BS  Musculoskeletal: No Edema  Neuro: alert, oriented   Data Reviewed: Basic Metabolic Panel: Recent Labs  Lab 04/15/17 2124 04/17/17 0524  NA 139 139  K 3.7 3.8  CL 106 110  CO2 23 23  GLUCOSE 99 87  BUN 15 6  CREATININE 0.86 0.82  CALCIUM 8.9 8.3*  MG  --  1.9   Liver Function Tests: Recent Labs  Lab 04/15/17 2124  AST 20  ALT 15  ALKPHOS 29*  BILITOT 0.6  PROT 7.3  ALBUMIN 4.3   Recent Labs  Lab 04/15/17 2124  LIPASE 35   No results for input(s): AMMONIA in the last 168 hours. CBC: Recent Labs  Lab 04/15/17 2124 04/16/17 0558 04/17/17 0524  WBC 13.4*  --  8.4  NEUTROABS 10.9*  --  4.7  HGB 15.1* 12.9 12.7  HCT 44.6 39.3 38.6  MCV 89.9  --  90.4  PLT 384  --  325   Cardiac Enzymes:   No results for input(s): CKTOTAL, CKMB, CKMBINDEX, TROPONINI in the last 168 hours. BNP (last 3 results) No results for input(s): BNP in the last 8760 hours.  ProBNP (last 3 results) No results for input(s): PROBNP in the last 8760 hours.  CBG: No results for input(s): GLUCAP in the last 168 hours.  Recent   Results (from the past 240 hour(s))  C difficile quick scan w PCR reflex     Status: None   Collection Time: 04/16/17  3:16 AM  Result Value Ref Range Status   C Diff antigen NEGATIVE NEGATIVE Final   C Diff toxin NEGATIVE NEGATIVE Final   C Diff interpretation No C. difficile detected.  Final    Comment: Performed at [facility], [place] [address], [place], [place] [address]  Gastrointestinal Panel by PCR , Stool     Status: None   Collection Time: 04/16/17  3:17 AM  Result Value Ref Range Status   Campylobacter species NOT DETECTED NOT DETECTED Final   Plesimonas shigelloides NOT DETECTED NOT DETECTED Final   Salmonella species NOT DETECTED NOT DETECTED Final    Yersinia enterocolitica NOT DETECTED NOT DETECTED Final   Vibrio species NOT DETECTED NOT DETECTED Final   Vibrio cholerae NOT DETECTED NOT DETECTED Final   Enteroaggregative E coli (EAEC) NOT DETECTED NOT DETECTED Final   Enteropathogenic E coli (EPEC) NOT DETECTED NOT DETECTED Final   Enterotoxigenic E coli (ETEC) NOT DETECTED NOT DETECTED Final   Shiga like toxin producing E coli (STEC) NOT DETECTED NOT DETECTED Final   Shigella/Enteroinvasive E coli (EIEC) NOT DETECTED NOT DETECTED Final   Cryptosporidium NOT DETECTED NOT DETECTED Final   Cyclospora cayetanensis NOT DETECTED NOT DETECTED Final   Entamoeba histolytica NOT DETECTED NOT DETECTED Final   Giardia lamblia NOT DETECTED NOT DETECTED Final   Adenovirus F40/41 NOT DETECTED NOT DETECTED Final   Astrovirus NOT DETECTED NOT DETECTED Final   Norovirus GI/GII NOT DETECTED NOT DETECTED Final   Rotavirus A NOT DETECTED NOT DETECTED Final   Sapovirus (I, II, IV, and V) NOT DETECTED NOT DETECTED Final    Comment: Performed at [facility], [address], [place], [place] [address]     Studies: No results found.  Scheduled Meds: . metoprolol tartrate  25 mg Oral BID  . norgestrel-ethinyl estradiol  1 tablet Oral Daily  . pantoprazole  40 mg Oral BID    Continuous Infusions: . sodium chloride 125 mL/hr at 04/16/17 0329  . ciprofloxacin 400 mg (04/17/17 0836)  . metronidazole 500 mg (04/17/17 0836)     Time spent: 85mns I have personally reviewed and interpreted on  04/17/2017 daily labs, tele strips, imagings as discussed above under date review session and assessment and plans.  I reviewed all nursing notes, pharmacy notes,  vitals, pertinent old records  I have discussed plan of care as described above with RN , patient and family on 04/17/2017   FFlorencia ReasonsMD, PhD  [facility] Pager [phone] If 7PM-7AM, please contact night-coverage at [email or web address], password [email or web address]4/07/2017, 2:17 PM  LOS: 1 day

## 2017-04-17 NOTE — Progress Notes (Signed)
Patient has migraine coming on. Normally take Fovatriptan 2.5mg  for migraines. Contacted pharmacy and this is not on formulary. Pt requested to be able to take her home medication. Contacted provider on call and advised that pt does have her home medication which is normally very effective and asked if pt can take home dose. Will c/t monitor.

## 2017-04-18 ENCOUNTER — Encounter (HOSPITAL_COMMUNITY): Payer: Self-pay

## 2017-04-18 DIAGNOSIS — K219 Gastro-esophageal reflux disease without esophagitis: Secondary | ICD-10-CM

## 2017-04-18 DIAGNOSIS — K279 Peptic ulcer, site unspecified, unspecified as acute or chronic, without hemorrhage or perforation: Secondary | ICD-10-CM

## 2017-04-18 LAB — CBC WITH DIFFERENTIAL/PLATELET
BASOS ABS: 0 10*3/uL (ref 0.0–0.1)
BASOS PCT: 0 %
Eosinophils Absolute: 0.6 10*3/uL (ref 0.0–0.7)
Eosinophils Relative: 8 %
HEMATOCRIT: 38.9 % (ref 36.0–46.0)
HEMOGLOBIN: 12.8 g/dL (ref 12.0–15.0)
Lymphocytes Relative: 33 %
Lymphs Abs: 2.4 10*3/uL (ref 0.7–4.0)
MCH: 29.8 pg (ref 26.0–34.0)
MCHC: 32.9 g/dL (ref 30.0–36.0)
MCV: 90.5 fL (ref 78.0–100.0)
MONOS PCT: 8 %
Monocytes Absolute: 0.6 10*3/uL (ref 0.1–1.0)
NEUTROS ABS: 3.7 10*3/uL (ref 1.7–7.7)
NEUTROS PCT: 51 %
Platelets: 329 10*3/uL (ref 150–400)
RBC: 4.3 MIL/uL (ref 3.87–5.11)
RDW: 12.6 % (ref 11.5–15.5)
WBC: 7.4 10*3/uL (ref 4.0–10.5)

## 2017-04-18 LAB — BASIC METABOLIC PANEL
ANION GAP: 9 (ref 5–15)
BUN: 5 mg/dL — ABNORMAL LOW (ref 6–20)
CHLORIDE: 110 mmol/L (ref 101–111)
CO2: 21 mmol/L — AB (ref 22–32)
Calcium: 8.3 mg/dL — ABNORMAL LOW (ref 8.9–10.3)
Creatinine, Ser: 0.81 mg/dL (ref 0.44–1.00)
GFR calc Af Amer: >60 mL/min (ref >60)
GFR calc non Af Amer: >60 mL/min (ref >60)
Glucose, Bld: 94 mg/dL (ref 65–99)
POTASSIUM: 3.6 mmol/L (ref 3.5–5.1)
Sodium: 140 mmol/L (ref 135–145)

## 2017-04-18 LAB — MAGNESIUM: Magnesium: 1.8 mg/dL (ref 1.7–2.4)

## 2017-04-18 MED ORDER — ONDANSETRON HCL 4 MG PO TABS: 4.0000 mg | ORAL_TABLET | Freq: Four times a day (QID) | ORAL | 0 refills | Status: DC | PRN

## 2017-04-18 MED ORDER — CIPROFLOXACIN HCL 500 MG PO TABS: 500.0000 mg | ORAL_TABLET | Freq: Two times a day (BID) | ORAL | 0 refills | Status: AC

## 2017-04-18 MED ORDER — METRONIDAZOLE 500 MG PO TABS: 500.0000 mg | ORAL_TABLET | Freq: Three times a day (TID) | ORAL | 0 refills | Status: AC

## 2017-04-18 NOTE — Progress Notes (Signed)
Patient has discharged to home on 04/18/17. Discharge instruction including medication and appointment was given to patient. Patient has no question at this time

## 2017-04-18 NOTE — Discharge Summary (Signed)
Physician Discharge Summary  Tonya Lee ZOX:[id number] DOB: January 16, 1985 DOA: 04/15/2017  PCP: Dettinger, Elige Radon, MD  Admit date: 04/15/2017 Discharge date: 04/18/2017  Admitted From: Home.  Disposition:  Home.   Recommendations for Outpatient Follow-up:  1. Follow up with PCP in 1-2 weeks 2. Please obtain BMP/CBC in one week 3. Please follow up with gastroenterology in 1 to 2 weeks before the antibiotics are completed.     Discharge Condition:stable.  CODE STATUS:full code.  Diet recommendation: Heart Healthy    Brief/Interim Summary:  Tonya Lee is a 32 y.o. female with medical history significant of PUD, GERD, recent knee procedure x5 days ago, was given ABx for procedure and put on Ibuprofen after procedure,  Presents to ED with 4-5 episodes of BRBPR while at home, associated with  bilateral lower abd pain, crampy. She was admitted for diffuse colitis of unclear etiology.   Discharge Diagnoses:  Active Problems:   Colitis  Colitis with rectal bleeding:  CT abd and pelvis  Shows Diffuse transmural thickening of the colon from distal transverse colon through rectum consistent with postinfectious or inflammatory proctocolitis. No bowel obstruction, free air nor abscess. She was started onIV ciprofloxacin and flagyl. Her symptoms improved and she was started on a diet and advanced as tolerated.  c diff is negative.  GI pcr is negative.  abd pain, diarrhea, vomiting has resolved. She has some nausea.  She was abel to tolerate regular diet without any issues.  She was recommended to follow up with GI on discharge.  She was given a referral for GI on discharge.     guiac positive stools possibly from colitis.  Follow up with GI.  Stable hemoglobin on discharge.    Knee procedure on Monday.  Pain improved.  Use tylenol as needed.    H/o PUD:  On PPI.  No black stools .    Discharge Instructions  Discharge Instructions    Diet - low sodium heart healthy    Complete by:  As directed    Discharge instructions   Complete by:  As directed    Please follow up with GASTROENTEROLOGY FOR YOUR COLITIS in one week.  Please follow up with PCP in one week.     Allergies as of 04/18/2017      Reactions   Sulfa Antibiotics Anaphylaxis, Hives, Other (See Comments)   Throat and face swelled   Acyclovir And Related Hives   Prednisone Nausea And Vomiting   Nitrofuran Derivatives Nausea Only   Abdominal pain      Medication List    STOP taking these medications   ibuprofen 200 MG tablet Commonly known as:  ADVIL,MOTRIN     TAKE these medications   albuterol 108 (90 Base) MCG/ACT inhaler Commonly known as:  PROVENTIL HFA;VENTOLIN HFA Inhale 2 puffs into the lungs every 6 (six) hours as needed for wheezing or shortness of breath.   ciprofloxacin 500 MG tablet Commonly known as:  CIPRO Take 1 tablet (500 mg total) by mouth 2 (two) times daily for 12 days.   CRYSELLE-28 0.3-30 MG-MCG tablet Generic drug:  norgestrel-ethinyl estradiol Take 1 tablet by mouth daily.   frovatriptan 2.5 MG tablet Commonly known as:  FROVA Take 1 tablet (2.5 mg total) by mouth as needed for migraine. If recurs, may repeat after 2 hours. Max of 3 tabs in 24 hours.   metoprolol tartrate 25 MG tablet Commonly known as:  LOPRESSOR TAKE 1 TABLET (25 MG TOTAL) 2 (TWO) TIMES DAILY BY MOUTH.  MUST BE SEEN FOR FURTHER REFILL   metroNIDAZOLE 500 MG tablet Commonly known as:  FLAGYL Take 1 tablet (500 mg total) by mouth 3 (three) times daily for 12 days.   ondansetron 4 MG tablet Commonly known as:  ZOFRAN Take 1 tablet (4 mg total) by mouth every 6 (six) hours as needed for nausea.   ondansetron 8 MG disintegrating tablet Commonly known as:  ZOFRAN-ODT Take 1 tablet (8 mg total) by mouth every 6 (six) hours as needed for nausea or vomiting.   pantoprazole 40 MG tablet Commonly known as:  PROTONIX Take 1 tablet (40 mg total) by mouth 2 (two) times daily.    promethazine 25 MG tablet Commonly known as:  PHENERGAN TAKE 1 TABLET (25 MG TOTAL) BY MOUTH EVERY 8 (EIGHT) HOURS AS NEEDED FOR NAUSEA OR VOMITING.      Follow-up Information    Dettinger, Elige Radon, MD Follow up in 1 week(s).   Specialties:  Family Medicine, Cardiology Contact information: [address] [place] [address] [phone]        Vida Rigger, MD Follow up.   Specialty:  Gastroenterology Contact information: [address] [place] [address] [phone]          Allergies  Allergen Reactions  . Sulfa Antibiotics Anaphylaxis, Hives and Other (See Comments)    Throat and face swelled  . Acyclovir And Related Hives  . Prednisone Nausea And Vomiting  . Nitrofuran Derivatives Nausea Only    Abdominal pain    Consultations:  None.    Procedures/Studies: Ct Abdomen Pelvis W Contrast  Result Date: 04/15/2017 CLINICAL DATA:  Bloody diarrhea x5 today. EXAM: CT ABDOMEN AND PELVIS WITH CONTRAST TECHNIQUE: Multidetector CT imaging of the abdomen and pelvis was performed using the standard protocol following bolus administration of intravenous contrast. CONTRAST:  ISOVUE-300 IOPAMIDOL (ISOVUE-300) INJECTION 61% COMPARISON:  01/30/2015 FINDINGS: Lower chest: Normal size heart without pericardial effusion. Clear lung bases. Hepatobiliary: Homogeneous attenuation of the liver. No space-occupying mass. Physiologic distention of the gallbladder with small fold at the fundus. No biliary dilatation. Pancreas: Normal Spleen: Normal Adrenals/Urinary Tract: Punctate interpolar right renal calculus. No hydronephrosis nor hydroureter. No space-occupying mass of either kidney. Normal bilateral adrenal glands. Unremarkable bladder for degree of distention. Stomach/Bowel: Moderate to marked inflammatory thickening of the colon from distal transverse colon through rectum consistent with proctocolitis. No bowel obstruction. Normal appendix. Normal small bowel  rotation. Nondistended stomach. Vascular/Lymphatic: No lymphadenopathy. No aortic aneurysm or dissection. Reproductive: Unremarkable uterus and adnexa. Other: Trace free fluid in the pelvis.  No free air. Musculoskeletal: No suspicious osseous abnormality. IMPRESSION: 1. Diffuse transmural thickening of the colon from distal transverse colon through rectum consistent with postinfectious or inflammatory proctocolitis. No bowel obstruction, free air nor abscess. 2. Punctate right interpolar renal calculus without obstruction. Electronically Signed   By: Tollie Eth M.D.   On: 04/15/2017 23:03   Mr Knee Right Wo Contrast  Result Date: 03/24/2017 CLINICAL DATA:  Pain and swelling of the knee for 1 year. Athletic injury. EXAM: MRI OF THE RIGHT KNEE WITHOUT CONTRAST TECHNIQUE: Multiplanar, multisequence MR imaging of the knee was performed. No intravenous contrast was administered. COMPARISON:  None. FINDINGS: MENISCI Medial meniscus:  Unremarkable Lateral meniscus:  Unremarkable LIGAMENTS Cruciates:  Unremarkable Collaterals:  Unremarkable CARTILAGE Patellofemoral:  Unremarkable Medial:  Unremarkable Lateral:  Unremarkable Joint: Subtle edema in the adipose tissue posterior to the lateral patellar facet for example on image 9/7 which could represent low-grade focal  synovitis. Thin medial plica. Popliteal Fossa:  Small Baker's cyst. Extensor Mechanism: Mildly thickened appearance of the medial patellar retinaculum near the patellar attachment on images 7 through 11 series 3. tibial tubercle-trochlear groove distance normal at 7 mm. Bones: No significant extra-articular osseous abnormalities identified. Other: No supplemental non-categorized findings. IMPRESSION: 1. Relatively minimal findings without a large internal derangement to explain the patient's chronic pain. There is some questionable focal synovitis posterior to the lateral patellar facet along with a small Baker's cyst and thin medial plica. 2. Mildly  thickened appearance of the medial patellar retinaculum could be an indicator of a prior injury. The retinaculum does not appear discontinuous. Electronically Signed   By: Gaylyn Rong M.D.   On: 03/24/2017 09:36       Subjective: No chest pain, sob, vomiting , abdpain or diarrhea, mild nausea.   Discharge Exam: Vitals:   04/17/17 2059 04/18/17 0549  BP: 107/72 113/69  Pulse: 69 61  Resp: 18 18  Temp: 98.8 F (37.1 C) 98.4 F (36.9 C)  SpO2: 100% 99%   Vitals:   04/17/17 0507 04/17/17 1429 04/17/17 2059 04/18/17 0549  BP: 98/63 116/75 107/72 113/69  Pulse: 60 66 69 61  Resp: 18 16 18 18   Temp: 98.4 F (36.9 C) 98.8 F (37.1 C) 98.8 F (37.1 C) 98.4 F (36.9 C)  TempSrc: Oral Oral Oral Oral  SpO2: 99% 100% 100% 99%  Weight:      Height:        General: Pt is alert, awake, not in acute distress Cardiovascular: RRR, S1/S2 +, no rubs, no gallops Respiratory: CTA bilaterally, no wheezing, no rhonchi Abdominal: Soft, NT, ND, bowel sounds + Extremities: no edema, no cyanosis    The results of significant diagnostics from this hospitalization (including imaging, microbiology, ancillary and laboratory) are listed below for reference.     Microbiology: Recent Results (from the past 240 hour(s))  C difficile quick scan w PCR reflex     Status: None   Collection Time: 04/16/17  3:16 AM  Result Value Ref Range Status   C Diff antigen NEGATIVE NEGATIVE Final   C Diff toxin NEGATIVE NEGATIVE Final   C Diff interpretation No C. difficile detected.  Final    Comment: Performed at [facility], [address] [place], [place] [address]  Gastrointestinal Panel by PCR , Stool     Status: None   Collection Time: 04/16/17  3:17 AM  Result Value Ref Range Status   Campylobacter species NOT DETECTED NOT DETECTED Final   Plesimonas shigelloides NOT DETECTED NOT DETECTED Final   Salmonella species NOT DETECTED NOT DETECTED Final   Yersinia enterocolitica  NOT DETECTED NOT DETECTED Final   Vibrio species NOT DETECTED NOT DETECTED Final   Vibrio cholerae NOT DETECTED NOT DETECTED Final   Enteroaggregative E coli (EAEC) NOT DETECTED NOT DETECTED Final   Enteropathogenic E coli (EPEC) NOT DETECTED NOT DETECTED Final   Enterotoxigenic E coli (ETEC) NOT DETECTED NOT DETECTED Final   Shiga like toxin producing E coli (STEC) NOT DETECTED NOT DETECTED Final   Shigella/Enteroinvasive E coli (EIEC) NOT DETECTED NOT DETECTED Final   Cryptosporidium NOT DETECTED NOT DETECTED Final   Cyclospora cayetanensis NOT DETECTED NOT DETECTED Final   Entamoeba histolytica NOT DETECTED NOT DETECTED Final   Giardia lamblia NOT DETECTED NOT DETECTED Final   Adenovirus F40/41 NOT DETECTED NOT DETECTED Final   Astrovirus NOT DETECTED NOT DETECTED Final   Norovirus GI/GII NOT DETECTED NOT DETECTED  Final   Rotavirus A NOT DETECTED NOT DETECTED Final   Sapovirus (I, II, IV, and V) NOT DETECTED NOT DETECTED Final    Comment: Performed at [facility], [address] [place], [place] [address]     Labs: BNP (last 3 results) No results for input(s): BNP in the last 8760 hours. Basic Metabolic Panel: Recent Labs  Lab 04/15/17 2124 04/17/17 0524 04/18/17 0517  NA 139 139 140  K 3.7 3.8 3.6  CL 106 110 110  CO2 23 23 21*  GLUCOSE 99 87 94  BUN 15 6 5*  CREATININE 0.86 0.82 0.81  CALCIUM 8.9 8.3* 8.3*  MG  --  1.9 1.8   Liver Function Tests: Recent Labs  Lab 04/15/17 2124  AST 20  ALT 15  ALKPHOS 29*  BILITOT 0.6  PROT 7.3  ALBUMIN 4.3   Recent Labs  Lab 04/15/17 2124  LIPASE 35   No results for input(s): AMMONIA in the last 168 hours. CBC: Recent Labs  Lab 04/15/17 2124 04/16/17 0558 04/17/17 0524 04/18/17 0517  WBC 13.4*  --  8.4 7.4  NEUTROABS 10.9*  --  4.7 3.7  HGB 15.1* 12.9 12.7 12.8  HCT 44.6 39.3 38.6 38.9  MCV 89.9  --  90.4 90.5  PLT 384  --  325 329   Cardiac Enzymes: No results for input(s): CKTOTAL, CKMB,  CKMBINDEX, TROPONINI in the last 168 hours. BNP: Invalid input(s): POCBNP CBG: No results for input(s): GLUCAP in the last 168 hours. D-Dimer No results for input(s): DDIMER in the last 72 hours. Hgb A1c No results for input(s): HGBA1C in the last 72 hours. Lipid Profile No results for input(s): CHOL, HDL, LDLCALC, TRIG, CHOLHDL, LDLDIRECT in the last 72 hours. Thyroid function studies No results for input(s): TSH, T4TOTAL, T3FREE, THYROIDAB in the last 72 hours.  Invalid input(s): FREET3 Anemia work up No results for input(s): VITAMINB12, FOLATE, FERRITIN, TIBC, IRON, RETICCTPCT in the last 72 hours. Urinalysis    Component Value Date/Time   COLORURINE YELLOW 04/15/2017 2125   APPEARANCEUR CLEAR 04/15/2017 2125   APPEARANCEUR Clear 02/03/2016 0845   LABSPEC 1.025 04/15/2017 2125   PHURINE 6.0 04/15/2017 2125   GLUCOSEU NEGATIVE 04/15/2017 2125   HGBUR TRACE (A) 04/15/2017 2125   BILIRUBINUR NEGATIVE 04/15/2017 2125   BILIRUBINUR Negative 02/03/2016 0845   KETONESUR NEGATIVE 04/15/2017 2125   PROTEINUR NEGATIVE 04/15/2017 2125   UROBILINOGEN negative 01/25/2015 0907   NITRITE NEGATIVE 04/15/2017 2125   LEUKOCYTESUR NEGATIVE 04/15/2017 2125   LEUKOCYTESUR 1+ (A) 02/03/2016 0845   Sepsis Labs Invalid input(s): PROCALCITONIN,  WBC,  LACTICIDVEN Microbiology Recent Results (from the past 240 hour(s))  C difficile quick scan w PCR reflex     Status: None   Collection Time: 04/16/17  3:16 AM  Result Value Ref Range Status   C Diff antigen NEGATIVE NEGATIVE Final   C Diff toxin NEGATIVE NEGATIVE Final   C Diff interpretation No C. difficile detected.  Final    Comment: Performed at [facility], [address] [place], [place] [address]  Gastrointestinal Panel by PCR , Stool     Status: None   Collection Time: 04/16/17  3:17 AM  Result Value Ref Range Status   Campylobacter species NOT DETECTED NOT DETECTED Final   Plesimonas shigelloides NOT DETECTED  NOT DETECTED Final   Salmonella species NOT DETECTED NOT DETECTED Final   Yersinia enterocolitica NOT DETECTED NOT DETECTED Final   Vibrio species NOT DETECTED NOT DETECTED  Final   Vibrio cholerae NOT DETECTED NOT DETECTED Final   Enteroaggregative E coli (EAEC) NOT DETECTED NOT DETECTED Final   Enteropathogenic E coli (EPEC) NOT DETECTED NOT DETECTED Final   Enterotoxigenic E coli (ETEC) NOT DETECTED NOT DETECTED Final   Shiga like toxin producing E coli (STEC) NOT DETECTED NOT DETECTED Final   Shigella/Enteroinvasive E coli (EIEC) NOT DETECTED NOT DETECTED Final   Cryptosporidium NOT DETECTED NOT DETECTED Final   Cyclospora cayetanensis NOT DETECTED NOT DETECTED Final   Entamoeba histolytica NOT DETECTED NOT DETECTED Final   Giardia lamblia NOT DETECTED NOT DETECTED Final   Adenovirus F40/41 NOT DETECTED NOT DETECTED Final   Astrovirus NOT DETECTED NOT DETECTED Final   Norovirus GI/GII NOT DETECTED NOT DETECTED Final   Rotavirus A NOT DETECTED NOT DETECTED Final   Sapovirus (I, II, IV, and V) NOT DETECTED NOT DETECTED Final    Comment: Performed at [facility], [address], [place], [place] [address]     Time coordinating discharge:  35  minutes  SIGNED:   Kathlen Mody, MD  [facility] 04/18/2017, 2:03 PM Pager   If 7PM-7AM, please contact night-coverage [email or web address] Password [email or web address]

## 2017-04-19 DIAGNOSIS — Z9889 Other specified postprocedural states: Secondary | ICD-10-CM | POA: Insufficient documentation

## 2017-05-03 ENCOUNTER — Other Ambulatory Visit: Payer: Self-pay | Admitting: Family Medicine

## 2017-05-03 NOTE — Telephone Encounter (Signed)
Last seen 09/14/16  Dr Dettinger

## 2017-05-08 ENCOUNTER — Encounter (HOSPITAL_COMMUNITY): Payer: Self-pay | Admitting: Family Medicine

## 2017-05-08 ENCOUNTER — Ambulatory Visit (HOSPITAL_COMMUNITY)
Admission: EM | Admit: 2017-05-08 | Discharge: 2017-05-08 | Disposition: A | Discharge status: Home / Self Care | Payer: BC Managed Care – PPO

## 2017-05-08 DIAGNOSIS — T50905A Adverse effect of unspecified drugs, medicaments and biological substances, initial encounter: Secondary | ICD-10-CM

## 2017-05-08 DIAGNOSIS — R590 Localized enlarged lymph nodes: Secondary | ICD-10-CM | POA: Diagnosis not present

## 2017-05-08 DIAGNOSIS — K529 Noninfective gastroenteritis and colitis, unspecified: Secondary | ICD-10-CM

## 2017-05-08 DIAGNOSIS — Z9189 Other specified personal risk factors, not elsewhere classified: Secondary | ICD-10-CM

## 2017-05-08 NOTE — Discharge Instructions (Signed)
You may take  Tylenol every 6 hours for pain and inflammation. Make sure you check with your PCP about an ultrasound to further evaluate your lymphadenopathy. I would also consider obtaining blood cell counts.

## 2017-05-08 NOTE — ED Triage Notes (Addendum)
Pt here for right cervical and right groin lymphadenopathy. She has recently been hospitalized for colitis. She has been taking abx. She has been feeling fatigued. No cold symptoms or fever. The right side of her neck is painful.

## 2017-05-08 NOTE — ED Provider Notes (Addendum)
MRN: [id number] DOB: [birth year]  Subjective:   Tyrika Northrup is a 32 y.o. female presenting for 1 day history of lymph node swelling and pain over her neck in her groin area.  Has also had associated fatigue.  Patient was hospitalized in early April for colitis, was discharged on Cipro and Flagyl.  Couple days after she completed the antibiotic course she developed a drug rash, was seen by her physician again and was started on oral antihistamines.  2 days later she developed her current symptoms.  Denies fever, red eyes, eye pain, sore throat, cough, chest pain, nausea, vomiting, belly pain, dysuria, genital rash, vaginal discharge.  Her rash has improved significantly since starting oral antihistamines.  She does have a follow-up appointment soon with her GI doctor, plan is to do endoscopy and colonoscopy per patient.   No current facility-administered medications for this encounter.   Current Outpatient Medications:  .  albuterol (PROVENTIL HFA;VENTOLIN HFA) 108 (90 Base) MCG/ACT inhaler, Inhale 2 puffs into the lungs every 6 (six) hours as needed for wheezing or shortness of breath., Disp: 1 Inhaler, Rfl: 2 .  frovatriptan (FROVA) 2.5 MG tablet, Take 1 tablet (2.5 mg total) by mouth as needed for migraine. If recurs, may repeat after 2 hours. Max of 3 tabs in 24 hours., Disp: 10 tablet, Rfl: 2 .  norgestrel-ethinyl estradiol (CRYSELLE-28) 0.3-30 MG-MCG tablet, Take 1 tablet by mouth daily., Disp: , Rfl:  .  ondansetron (ZOFRAN) 4 MG tablet, Take 1 tablet (4 mg total) by mouth every 6 (six) hours as needed for nausea., Disp: 20 tablet, Rfl: 0 .  ondansetron (ZOFRAN-ODT) 8 MG disintegrating tablet, Take 1 tablet (8 mg total) by mouth every 6 (six) hours as needed for nausea or vomiting., Disp: 20 tablet, Rfl: 1 .  pantoprazole (PROTONIX) 40 MG tablet, TAKE 1 TABLET BY MOUTH TWICE A DAY, Disp: 180 tablet, Rfl: 0 .  promethazine (PHENERGAN) 25 MG tablet, TAKE 1 TABLET (25 MG TOTAL) BY MOUTH EVERY 8  (EIGHT) HOURS AS NEEDED FOR NAUSEA OR VOMITING., Disp: 20 tablet, Rfl: 0    Allergies  Allergen Reactions  . Sulfa Antibiotics Anaphylaxis, Hives and Other (See Comments)    Throat and face swelled  . Acyclovir And Related Hives  . Ciprofloxacin   . Prednisone Nausea And Vomiting  . Nitrofuran Derivatives Nausea Only    Abdominal pain    Past Medical History:  Diagnosis Date  . 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 GERD (gastroesophageal reflux disease)   . Migraines   . Peptic ulcer disease      Past Surgical History:  Procedure Laterality Date  . ANKLE SURGERY Right july 2013  . FASCIECTOMY Right    right lower leg  . KNEE SURGERY      Family History  Problem Relation Age of Onset  . Asthma Mother   . Hyperlipidemia Maternal Grandmother   . Hypertension Maternal Grandmother   . Asthma Maternal Grandmother   . Heart disease Maternal Grandfather   . Cancer Maternal Grandfather        thyroid  . Diabetes Maternal Grandfather   . Heart disease Paternal Grandfather      Objective:   Vitals: BP 125/87   Pulse 84   Temp 98.5 F (36.9 C)   Resp 18   LMP 05/08/2017   SpO2 100%   Physical Exam  Constitutional: She is oriented to person, place, and time. She appears well-developed and well-nourished.  HENT:  Mouth/Throat:  Oropharynx is clear and moist.  Eyes: Right eye exhibits no discharge. Left eye exhibits no discharge. No scleral icterus.  Neck: Normal range of motion. Neck supple.  Cardiovascular: Normal rate, regular rhythm and intact distal pulses. Exam reveals no gallop and no friction rub.  No murmur heard. Pulmonary/Chest: No respiratory distress. She has no wheezes. She has no rales.  Lymphadenopathy:       Head (right side): No submental, no submandibular, no preauricular, no posterior auricular and no occipital adenopathy present.       Head (left side): No submental, no submandibular, no preauricular, no posterior auricular and  no occipital adenopathy present.    She has cervical adenopathy (left-sided upper, anterior).       Right cervical: No superficial cervical, no deep cervical and no posterior cervical adenopathy present.      Left cervical: No superficial cervical, no deep cervical and no posterior cervical adenopathy present.    She has no axillary adenopathy.       Right: Inguinal adenopathy present. No supraclavicular adenopathy present.       Left: No inguinal and no supraclavicular adenopathy present.  Neurological: She is alert and oriented to person, place, and time.  Skin: Skin is warm and dry.  Psychiatric: She has a normal mood and affect.   Assessment and Plan :   Reactive cervical lymphadenopathy  Inguinal lymphadenopathy  Colitis  At risk for allergic reaction to medication  Adverse effect of drug, initial encounter  Patient is unable to take NSAIDs right now due to her colitis.  Overall physical exam findings are reassuring.  Counseled patient that she can follow-up with her PCP and hopefully pursue an ultrasound and blood work to further evaluate her lymphadenopathy.  Return to clinic precautions discussed.  For now patient will do 500 mg of Tylenol as needed.   Wallis Bamberg, [place] 05/08/17 1911

## 2017-05-09 ENCOUNTER — Ambulatory Visit: Payer: BC Managed Care – PPO | Admitting: Physician Assistant

## 2017-05-09 ENCOUNTER — Encounter: Payer: Self-pay | Admitting: Physician Assistant

## 2017-05-09 VITALS — BP 111/84 | HR 70 | Temp 98.0°F | Ht 65.0 in | Wt 128.6 lb

## 2017-05-09 DIAGNOSIS — I889 Nonspecific lymphadenitis, unspecified: Secondary | ICD-10-CM

## 2017-05-09 DIAGNOSIS — R5383 Other fatigue: Secondary | ICD-10-CM

## 2017-05-09 NOTE — Progress Notes (Signed)
BP 111/84   Pulse 70   Temp 98 F (36.7 C) (Oral)   Ht _0  (1.651 m)   Wt 128 lb 9.6 oz (58.3 kg)   LMP 05/08/2017   BMI 21.40 kg/m    Subjective:    Patient ID: Tonya Lee, female    DOB: 1985/02/15, 32 y.o.   MRN: [id number]  HPI: Tonya Lee is a 32 y.o. female presenting on 05/09/2017 for Adenopathy (in Groin and neck )  Patient with a recent extensive medical situation.  On April 3 she had knee surgery.  2 days later she was admitted for colitis.  She was hospitalized for 4 days.  She was discharged and had to stay on antibiotics.  She completed them about 10 days ago.  While she was on them she developed a drug rash.  The gastroenterologist felt that it was due to the Cipro.  This is added to her allergy list.  In the past 4 days she has had swollen nodes in her right groin and right anterior cervical area.  She also feels that there is one on the back of her skull on the left.  She was seen in urgent care yesterday.  They were unable to do much lab work and advised to come here.  She is not taking birth control.  She did have a cycle last week that was shorter than normal for her.  She states it was very heavy for 2 days though.  She still feels some lower abdominal pressure.  She is scheduled for a colonoscopy in 2 days.  Past Medical History:  Diagnosis Date  . Compartment syndrome of right lower extremity ([place])   . GERD (gastroesophageal reflux disease)   . Migraines   . Peptic ulcer disease    Relevant past medical, surgical, family and social history reviewed and updated as indicated. Interim medical history since our last visit reviewed. Allergies and medications reviewed and updated. DATA REVIEWED: CHART IN EPIC  Family History reviewed for pertinent findings.  Review of Systems  Constitutional: Positive for fatigue. Negative for activity change and fever.  HENT: Negative.  Negative for congestion, ear pain and postnasal drip.   Eyes: Negative.   Negative for pain.  Respiratory: Negative.  Negative for cough and wheezing.   Cardiovascular: Negative.  Negative for chest pain, palpitations and leg swelling.  Gastrointestinal: Positive for abdominal pain.  Endocrine: Negative.   Genitourinary: Positive for pelvic pain. Negative for dysuria.  Musculoskeletal: Negative.  Negative for arthralgias and joint swelling.  Skin: Negative.   Allergic/Immunologic: Negative.   Neurological: Negative.   Hematological: Positive for adenopathy. Does not bruise/bleed easily.    Allergies as of 05/09/2017      Reactions   Sulfa Antibiotics Anaphylaxis, Hives, Other (See Comments)   Throat and face swelled   Acyclovir And Related Hives   Ciprofloxacin    Prednisone Nausea And Vomiting   Nitrofuran Derivatives Nausea Only   Abdominal pain      Medication List        Accurate as of 05/09/17  9:23 AM. Always use your most recent med list.          albuterol 108 (90 Base) MCG/ACT inhaler Commonly known as:  PROVENTIL HFA;VENTOLIN HFA Inhale 2 puffs into the lungs every 6 (six) hours as needed for wheezing or shortness of breath.   CRYSELLE-28 0.3-30 MG-MCG tablet Generic drug:  norgestrel-ethinyl estradiol Take 1 tablet by mouth daily.   frovatriptan 2.5  MG tablet Commonly known as:  FROVA Take 1 tablet (2.5 mg total) by mouth as needed for migraine. If recurs, may repeat after 2 hours. Max of 3 tabs in 24 hours.   pantoprazole 40 MG tablet Commonly known as:  PROTONIX TAKE 1 TABLET BY MOUTH TWICE A DAY          Objective:    BP 111/84   Pulse 70   Temp 98 F (36.7 C) (Oral)   Ht _0  (1.651 m)   Wt 128 lb 9.6 oz (58.3 kg)   LMP 05/08/2017   BMI 21.40 kg/m   Allergies  Allergen Reactions  . Sulfa Antibiotics Anaphylaxis, Hives and Other (See Comments)    Throat and face swelled  . Acyclovir And Related Hives  . Ciprofloxacin   . Prednisone Nausea And Vomiting  . Nitrofuran Derivatives Nausea Only    Abdominal pain     Wt Readings from Last 3 Encounters:  05/09/17 128 lb 9.6 oz (58.3 kg)  04/16/17 134 lb 0.6 oz (60.8 kg)  11/03/16 131 lb (59.4 kg)    Physical Exam  Constitutional: She is oriented to person, place, and time. She appears well-developed and well-nourished.  HENT:  Head: Normocephalic and atraumatic.  Right Ear: Tympanic membrane, external ear and ear canal normal.  Left Ear: Tympanic membrane, external ear and ear canal normal.  Nose: Nose normal. No rhinorrhea.  Mouth/Throat: Oropharynx is clear and moist and mucous membranes are normal. No oropharyngeal exudate or posterior oropharyngeal erythema.  Eyes: Pupils are equal, round, and reactive to light. Conjunctivae and EOM are normal.  Neck: Normal range of motion. Neck supple.  Cardiovascular: Normal rate, regular rhythm, normal heart sounds and intact distal pulses.  Pulmonary/Chest: Effort normal and breath sounds normal.  Abdominal: Soft. Bowel sounds are normal. She exhibits no distension and no mass. There is no tenderness. There is no rebound.  Lymphadenopathy:       Head (right side): No tonsillar adenopathy present.       Head (left side): Occipital adenopathy present.    She has cervical adenopathy.       Right axillary: No pectoral and no lateral adenopathy present.       Left axillary: No pectoral and no lateral adenopathy present.      Right: Inguinal adenopathy present. No supraclavicular and no epitrochlear adenopathy present.       Left: No inguinal, no supraclavicular and no epitrochlear adenopathy present.  Neurological: She is alert and oriented to person, place, and time. She has normal reflexes.  Skin: Skin is warm and dry. No rash noted.  Psychiatric: She has a normal mood and affect. Her behavior is normal. Judgment and thought content normal.    Results for orders placed or performed during the hospital encounter of 04/15/17  C difficile quick scan w PCR reflex  Result Value Ref Range   C Diff antigen  NEGATIVE NEGATIVE   C Diff toxin NEGATIVE NEGATIVE   C Diff interpretation No C. difficile detected.   Gastrointestinal Panel by PCR , Stool  Result Value Ref Range   Campylobacter species NOT DETECTED NOT DETECTED   Plesimonas shigelloides NOT DETECTED NOT DETECTED   Salmonella species NOT DETECTED NOT DETECTED   Yersinia enterocolitica NOT DETECTED NOT DETECTED   Vibrio species NOT DETECTED NOT DETECTED   Vibrio cholerae NOT DETECTED NOT DETECTED   Enteroaggregative E coli (EAEC) NOT DETECTED NOT DETECTED   Enteropathogenic E coli (EPEC) NOT DETECTED NOT DETECTED  Enterotoxigenic E coli (ETEC) NOT DETECTED NOT DETECTED   Shiga like toxin producing E coli (STEC) NOT DETECTED NOT DETECTED   Shigella/Enteroinvasive E coli (EIEC) NOT DETECTED NOT DETECTED   Cryptosporidium NOT DETECTED NOT DETECTED   Cyclospora cayetanensis NOT DETECTED NOT DETECTED   Entamoeba histolytica NOT DETECTED NOT DETECTED   Giardia lamblia NOT DETECTED NOT DETECTED   Adenovirus F40/41 NOT DETECTED NOT DETECTED   Astrovirus NOT DETECTED NOT DETECTED   Norovirus GI/GII NOT DETECTED NOT DETECTED   Rotavirus A NOT DETECTED NOT DETECTED   Sapovirus (I, II, IV, and V) NOT DETECTED NOT DETECTED  CBC with Differential  Result Value Ref Range   WBC 13.4 (H) 4.0 - 10.5 K/uL   RBC 4.96 3.87 - 5.11 MIL/uL   Hemoglobin 15.1 (H) 12.0 - 15.0 g/dL   HCT 44.6 36.0 - 46.0 %   MCV 89.9 78.0 - 100.0 fL   MCH 30.4 26.0 - 34.0 pg   MCHC 33.9 30.0 - 36.0 g/dL   RDW 12.8 11.5 - 15.5 %   Platelets 384 150 - 400 K/uL   Neutrophils Relative % 82 %   Neutro Abs 10.9 (H) 1.7 - 7.7 K/uL   Lymphocytes Relative 10 %   Lymphs Abs 1.4 0.7 - 4.0 K/uL   Monocytes Relative 7 %   Monocytes Absolute 0.9 0.1 - 1.0 K/uL   Eosinophils Relative 1 %   Eosinophils Absolute 0.2 0.0 - 0.7 K/uL   Basophils Relative 0 %   Basophils Absolute 0.0 0.0 - 0.1 K/uL  Comprehensive metabolic panel  Result Value Ref Range   Sodium 139 135 - 145  mmol/L   Potassium 3.7 3.5 - 5.1 mmol/L   Chloride 106 101 - 111 mmol/L   CO2 23 22 - 32 mmol/L   Glucose, Bld 99 65 - 99 mg/dL   BUN 15 6 - 20 mg/dL   Creatinine, Ser 0.86 0.44 - 1.00 mg/dL   Calcium 8.9 8.9 - 10.3 mg/dL   Total Protein 7.3 6.5 - 8.1 g/dL   Albumin 4.3 3.5 - 5.0 g/dL   AST 20 15 - 41 U/L   ALT 15 14 - 54 U/L   Alkaline Phosphatase 29 (L) 38 - 126 U/L   Total Bilirubin 0.6 0.3 - 1.2 mg/dL   GFR calc non Af Amer >60 >60 mL/min   GFR calc Af Amer >60 >60 mL/min   Anion gap 10 5 - 15  Lipase, blood  Result Value Ref Range   Lipase 35 11 - 51 U/L  Urinalysis, Routine w reflex microscopic  Result Value Ref Range   Color, Urine YELLOW YELLOW   APPearance CLEAR CLEAR   Specific Gravity, Urine 1.025 1.005 - 1.030   pH 6.0 5.0 - 8.0   Glucose, UA NEGATIVE NEGATIVE mg/dL   Hgb urine dipstick TRACE (A) NEGATIVE   Bilirubin Urine NEGATIVE NEGATIVE   Ketones, ur NEGATIVE NEGATIVE mg/dL   Protein, ur NEGATIVE NEGATIVE mg/dL   Nitrite NEGATIVE NEGATIVE   Leukocytes, UA NEGATIVE NEGATIVE  Pregnancy, urine  Result Value Ref Range   Preg Test, Ur NEGATIVE NEGATIVE  Urinalysis, Microscopic (reflex)  Result Value Ref Range   RBC / HPF 6-30 0 - 5 RBC/hpf   WBC, UA 0-5 0 - 5 WBC/hpf   Bacteria, UA RARE (A) NONE SEEN   Squamous Epithelial / LPF 6-30 (A) NONE SEEN   Mucus PRESENT   Occult blood card to lab, stool  Result Value Ref Range  Fecal Occult Bld POSITIVE (A) NEGATIVE  HIV antibody (Routine Testing)  Result Value Ref Range   HIV Screen 4th Generation wRfx Non Reactive Non Reactive  Hemoglobin and hematocrit, blood  Result Value Ref Range   Hemoglobin 12.9 12.0 - 15.0 g/dL   HCT 39.3 36.0 - 46.0 %  CBC with Differential/Platelet  Result Value Ref Range   WBC 8.4 4.0 - 10.5 K/uL   RBC 4.27 3.87 - 5.11 MIL/uL   Hemoglobin 12.7 12.0 - 15.0 g/dL   HCT 38.6 36.0 - 46.0 %   MCV 90.4 78.0 - 100.0 fL   MCH 29.7 26.0 - 34.0 pg   MCHC 32.9 30.0 - 36.0 g/dL   RDW  12.5 11.5 - 15.5 %   Platelets 325 150 - 400 K/uL   Neutrophils Relative % 57 %   Neutro Abs 4.7 1.7 - 7.7 K/uL   Lymphocytes Relative 30 %   Lymphs Abs 2.5 0.7 - 4.0 K/uL   Monocytes Relative 7 %   Monocytes Absolute 0.6 0.1 - 1.0 K/uL   Eosinophils Relative 6 %   Eosinophils Absolute 0.5 0.0 - 0.7 K/uL   Basophils Relative 0 %   Basophils Absolute 0.0 0.0 - 0.1 K/uL  Basic metabolic panel  Result Value Ref Range   Sodium 139 135 - 145 mmol/L   Potassium 3.8 3.5 - 5.1 mmol/L   Chloride 110 101 - 111 mmol/L   CO2 23 22 - 32 mmol/L   Glucose, Bld 87 65 - 99 mg/dL   BUN 6 6 - 20 mg/dL   Creatinine, Ser 0.82 0.44 - 1.00 mg/dL   Calcium 8.3 (L) 8.9 - 10.3 mg/dL   GFR calc non Af Amer >60 >60 mL/min   GFR calc Af Amer >60 >60 mL/min   Anion gap 6 5 - 15  Magnesium  Result Value Ref Range   Magnesium 1.9 1.7 - 2.4 mg/dL  Sedimentation rate  Result Value Ref Range   Sed Rate 5 0 - 22 mm/hr  C-reactive protein  Result Value Ref Range   CRP 4.2 (H) <1.0 mg/dL  CBC with Differential/Platelet  Result Value Ref Range   WBC 7.4 4.0 - 10.5 K/uL   RBC 4.30 3.87 - 5.11 MIL/uL   Hemoglobin 12.8 12.0 - 15.0 g/dL   HCT 38.9 36.0 - 46.0 %   MCV 90.5 78.0 - 100.0 fL   MCH 29.8 26.0 - 34.0 pg   MCHC 32.9 30.0 - 36.0 g/dL   RDW 12.6 11.5 - 15.5 %   Platelets 329 150 - 400 K/uL   Neutrophils Relative % 51 %   Neutro Abs 3.7 1.7 - 7.7 K/uL   Lymphocytes Relative 33 %   Lymphs Abs 2.4 0.7 - 4.0 K/uL   Monocytes Relative 8 %   Monocytes Absolute 0.6 0.1 - 1.0 K/uL   Eosinophils Relative 8 %   Eosinophils Absolute 0.6 0.0 - 0.7 K/uL   Basophils Relative 0 %   Basophils Absolute 0.0 0.0 - 0.1 K/uL  Basic metabolic panel  Result Value Ref Range   Sodium 140 135 - 145 mmol/L   Potassium 3.6 3.5 - 5.1 mmol/L   Chloride 110 101 - 111 mmol/L   CO2 21 (L) 22 - 32 mmol/L   Glucose, Bld 94 65 - 99 mg/dL   BUN 5 (L) 6 - 20 mg/dL   Creatinine, Ser 0.81 0.44 - 1.00 mg/dL   Calcium 8.3 (L) 8.9  - 10.3 mg/dL   GFR  calc non Af Amer >60 >60 mL/min   GFR calc Af Amer >60 >60 mL/min   Anion gap 9 5 - 15  Magnesium  Result Value Ref Range   Magnesium 1.8 1.7 - 2.4 mg/dL      Assessment & Plan:   1. Fatigue, unspecified type - CBC with Differential/Platelet - CMP14+EGFR - Thyroid Panel With TSH - Sedimentation rate - Beta hCG quant (ref lab) - Mono (Epstein Barr Virus)  2. Lymphadenitis - CBC with Differential/Platelet - CMP14+EGFR - Thyroid Panel With TSH - Sedimentation rate - Beta hCG quant (ref lab) - Mono (Epstein Barr Virus)   Continue all other maintenance medications as listed above.  Follow up plan: No follow-ups on file.  Educational handout given for [place] PA-C [facility] [address]  [place], [place] [address] [phone]   05/09/2017, 9:23 AM

## 2017-05-10 ENCOUNTER — Telehealth: Payer: Self-pay | Admitting: Family Medicine

## 2017-05-10 LAB — SEDIMENTATION RATE: SED RATE: 3 mm/h (ref 0–32)

## 2017-05-10 LAB — CMP14+EGFR
ALBUMIN: 4.4 g/dL (ref 3.5–5.5)
ALK PHOS: 41 IU/L (ref 39–117)
ALT: 9 IU/L (ref 0–32)
AST: 12 IU/L (ref 0–40)
Albumin/Globulin Ratio: 1.9 (ref 1.2–2.2)
BUN / CREAT RATIO: 9 (ref 9–23)
BUN: 8 mg/dL (ref 6–20)
Bilirubin Total: <0.2 mg/dL (ref 0.0–1.2)
CO2: 23 mmol/L (ref 20–29)
CREATININE: 0.85 mg/dL (ref 0.57–1.00)
Calcium: 9.4 mg/dL (ref 8.7–10.2)
Chloride: 107 mmol/L — ABNORMAL HIGH (ref 96–106)
GFR calc Af Amer: 106 mL/min/{1.73_m2} (ref >59)
GFR calc non Af Amer: 92 mL/min/{1.73_m2} (ref >59)
GLUCOSE: 84 mg/dL (ref 65–99)
Globulin, Total: 2.3 g/dL (ref 1.5–4.5)
Potassium: 4.7 mmol/L (ref 3.5–5.2)
Sodium: 141 mmol/L (ref 134–144)
Total Protein: 6.7 g/dL (ref 6.0–8.5)

## 2017-05-10 LAB — CBC WITH DIFFERENTIAL/PLATELET
Basophils Absolute: 0 10*3/uL (ref 0.0–0.2)
Basos: 1 %
EOS (ABSOLUTE): 0.7 10*3/uL — ABNORMAL HIGH (ref 0.0–0.4)
Eos: 15 %
HEMOGLOBIN: 14 g/dL (ref 11.1–15.9)
Hematocrit: 43 % (ref 34.0–46.6)
IMMATURE GRANULOCYTES: 0 %
Immature Grans (Abs): 0 10*3/uL (ref 0.0–0.1)
LYMPHS ABS: 1.6 10*3/uL (ref 0.7–3.1)
Lymphs: 34 %
MCH: 29.5 pg (ref 26.6–33.0)
MCHC: 32.6 g/dL (ref 31.5–35.7)
MCV: 91 fL (ref 79–97)
MONOCYTES: 14 %
Monocytes Absolute: 0.7 10*3/uL (ref 0.1–0.9)
NEUTROS PCT: 36 %
Neutrophils Absolute: 1.7 10*3/uL (ref 1.4–7.0)
Platelets: 394 10*3/uL — ABNORMAL HIGH (ref 150–379)
RBC: 4.74 x10E6/uL (ref 3.77–5.28)
RDW: 13.3 % (ref 12.3–15.4)
WBC: 4.7 10*3/uL (ref 3.4–10.8)

## 2017-05-10 LAB — THYROID PANEL WITH TSH
FREE THYROXINE INDEX: 2.4 (ref 1.2–4.9)
T3 Uptake Ratio: 24 % (ref 24–39)
T4 TOTAL: 10.1 ug/dL (ref 4.5–12.0)
TSH: 4.25 u[IU]/mL (ref 0.450–4.500)

## 2017-05-10 LAB — BETA HCG QUANT (REF LAB): hCG Quant: <1 m[IU]/mL

## 2017-05-10 NOTE — Telephone Encounter (Signed)
Patient was notified of results via wendy wray

## 2017-05-10 NOTE — Telephone Encounter (Signed)
Result is in the labs, everything good, can proceed. Only test pending is the mono test

## 2017-05-18 ENCOUNTER — Ambulatory Visit: Payer: BC Managed Care – PPO | Admitting: Family Medicine

## 2017-05-18 ENCOUNTER — Encounter: Payer: Self-pay | Admitting: Family Medicine

## 2017-05-18 VITALS — BP 110/77 | HR 90 | Temp 98.0°F | Ht 65.0 in | Wt 127.2 lb

## 2017-05-18 DIAGNOSIS — I809 Phlebitis and thrombophlebitis of unspecified site: Secondary | ICD-10-CM

## 2017-05-18 DIAGNOSIS — T801XXA Vascular complications following infusion, transfusion and therapeutic injection, initial encounter: Secondary | ICD-10-CM

## 2017-05-24 ENCOUNTER — Encounter: Payer: Self-pay | Admitting: Family Medicine

## 2017-05-24 NOTE — Progress Notes (Signed)
Chief Complaint  Patient presents with  . Wrist Pain    pt here today c/o right wrist pain    HPI  Patient presents today for patient had a IV placed in her right antecubital fossa while she was hospitalized a few weeks ago.  She noted afterwards that it infiltrated and there was a great deal of swelling and burning down to her wrist.  It has improved significantly but there is still some tenderness at the wrist.  She points to the region of the radial styloid.  PMH: Smoking status noted ROS: Per HPI  Objective: BP 110/77   Pulse 90   Temp 98 F (36.7 C) (Oral)   Ht  (1.651 m)   Wt 127 lb 4 oz (57.7 kg)   LMP 05/08/2017   BMI 21.18 kg/m  Gen: NAD, alert, cooperative with exam HEENT: NCAT, EOMI, PERRL CV: RRR, good S1/S2, no murmur Resp: CTABL, no wheezes, non-labored Ext: No edema, warm.  There is tenderness at the radial styloid.  There is full range of motion without tenderness at the wrist the right upper extremity is neurovascularly intact. Neuro: Alert and oriented, No gross deficits  Assessment and plan:  1. Phlebitis after infusion, initial encounter     Since the symptoms have improved significantly since onset and the patient is able to function adequately, treatment was deferred.  However she was given signs and symptoms of recurrence and worsening to look out for and she will return should any change occur for the worse.    Follow up as needed.  Mechele Claude, MD

## 2017-06-22 ENCOUNTER — Other Ambulatory Visit: Payer: Self-pay | Admitting: Family Medicine

## 2017-06-29 ENCOUNTER — Other Ambulatory Visit: Payer: Self-pay | Admitting: Family Medicine

## 2017-09-14 ENCOUNTER — Other Ambulatory Visit: Payer: Self-pay | Admitting: *Deleted

## 2017-09-14 MED ORDER — FROVATRIPTAN SUCCINATE 2.5 MG PO TABS: 2.5000 mg | ORAL_TABLET | ORAL | 2 refills | Status: DC | PRN

## 2017-10-11 ENCOUNTER — Encounter: Payer: Self-pay | Admitting: Family Medicine

## 2017-10-11 ENCOUNTER — Ambulatory Visit: Payer: BC Managed Care – PPO | Admitting: Family Medicine

## 2017-10-11 VITALS — BP 107/79 | HR 70 | Temp 97.7°F | Ht 65.0 in | Wt 132.0 lb

## 2017-10-11 DIAGNOSIS — G43109 Migraine with aura, not intractable, without status migrainosus: Secondary | ICD-10-CM | POA: Diagnosis not present

## 2017-10-11 DIAGNOSIS — R110 Nausea: Secondary | ICD-10-CM | POA: Diagnosis not present

## 2017-10-11 MED ORDER — PROMETHAZINE HCL 25 MG PO TABS: 25.0000 mg | ORAL_TABLET | Freq: Three times a day (TID) | ORAL | 0 refills | Status: DC | PRN

## 2017-10-11 MED ORDER — KETOROLAC TROMETHAMINE 60 MG/2ML IM SOLN
60.0000 mg | Freq: Once | INTRAMUSCULAR | Status: AC
  Administered 2017-10-11: 60 mg via INTRAMUSCULAR

## 2017-10-11 NOTE — Addendum Note (Signed)
Addended by: Sonny Masters on: 10/11/2017 03:35 PM   Modules accepted: Orders

## 2017-10-11 NOTE — Progress Notes (Signed)
Subjective:    Patient ID: Tonya Lee, female    DOB: [birth year], 32 y.o.   MRN: [id number]  Chief Complaint:  Migraine (x 1 week, has been taking Frova)   HPI: Tonya Lee is a 32 y.o. female presenting on 10/11/2017 for Migraine (x 1 week, has been taking Frova)  Pt presents today with a migraine headache. She reports this started 8 days ago. She has tried her abortive medications without complete relief of her symptoms. She states she is no longer taking the Lopressor, states she has been off of this medication for months. She reports the Frova will decrease the headache for a few hours and then it returns. Pt states the pain is throbbing/pulstaing and right sided. States 8/10. States she has nausea, photophobia, phonophobia, and decreased appetite with the headache. She states she is not resting well due to the pain. She reports stopping her OCPs about  3 months ago in preparation for pregnancy and is wondering if this could be due to hormonal changes as she has not had a severe migraine in months. She states she does have an aura prior getting a headache and did have one prior to the start of this headache. She denies fever, chills, syncope, weakness, or loss of function.   Relevant past medical, surgical, family, and social history reviewed and updated as indicated.  Allergies and medications reviewed and updated.   Past Medical History:  Diagnosis Date  . 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 GERD (gastroesophageal reflux disease)   . Migraines   . Peptic ulcer disease     Past Surgical History:  Procedure Laterality Date  . ANKLE SURGERY Right july 2013  . FASCIECTOMY Right    right lower leg  . KNEE SURGERY      Social History   Socioeconomic History  . Marital status: Married    Spouse name: Not on file  . Number of children: Not on file  . Years of education: Not on file  . Highest education level: Not on file  Occupational History  .  Not on file  Social Needs  . Financial resource strain: Not on file  . Food insecurity:    Worry: Not on file    Inability: Not on file  . Transportation needs:    Medical: Not on file    Non-medical: Not on file  Tobacco Use  . Smoking status: Never Smoker  . Smokeless tobacco: Never Used  Substance and Sexual Activity  . Alcohol use: No  . Drug use: No  . Sexual activity: Yes    Birth control/protection: Pill  Lifestyle  . Physical activity:    Days per week: Not on file    Minutes per session: Not on file  . Stress: Not on file  Relationships  . Social connections:    Talks on phone: Not on file    Gets together: Not on file    Attends religious service: Not on file    Active member of club or organization: Not on file    Attends meetings of clubs or organizations: Not on file    Relationship status: Not on file  . Intimate partner violence:    Fear of current or ex partner: Not on file    Emotionally abused: Not on file    Physically abused: Not on file    Forced sexual activity: Not on file  Other Topics Concern  . Not on file  Social  History Narrative  . Not on file    Outpatient Encounter Medications as of 10/11/2017  Medication Sig  . albuterol (PROVENTIL HFA;VENTOLIN HFA) 108 (90 Base) MCG/ACT inhaler Inhale 2 puffs into the lungs every 6 (six) hours as needed for wheezing or shortness of breath.  . frovatriptan (FROVA) 2.5 MG tablet Take 1 tablet (2.5 mg total) by mouth as needed for migraine. If recurs, may repeat after 2 hours. Max of 3 tabs in 24 hours.  . metoprolol tartrate (LOPRESSOR) 25 MG tablet TAKE 1 TABLET (25 MG TOTAL) 2 (TWO) TIMES DAILY BY MOUTH. MUST BE SEEN FOR FURTHER REFILL  . [DISCONTINUED] metoprolol tartrate (LOPRESSOR) 25 MG tablet TAKE 1 TABLET (25 MG TOTAL) 2 (TWO) TIMES DAILY BY MOUTH. MUST BE SEEN FOR FURTHER REFILL  . [DISCONTINUED] norgestrel-ethinyl estradiol (CRYSELLE-28) 0.3-30 MG-MCG tablet Take 1 tablet by mouth daily.  .  [DISCONTINUED] pantoprazole (PROTONIX) 40 MG tablet TAKE 1 TABLET BY MOUTH TWICE A DAY  . promethazine (PHENERGAN) 25 MG tablet Take 1 tablet (25 mg total) by mouth every 8 (eight) hours as needed for nausea or vomiting.   Facility-Administered Encounter Medications as of 10/11/2017  Medication  . ketorolac (TORADOL) injection 60 mg    Allergies  Allergen Reactions  . Sulfa Antibiotics Anaphylaxis, Hives and Other (See Comments)    Throat and face swelled  . Acyclovir And Related Hives  . Ciprofloxacin   . Prednisone Nausea And Vomiting  . Nitrofuran Derivatives Nausea Only    Abdominal pain    Review of Systems  Constitutional: Positive for appetite change. Negative for chills, fatigue and fever.  HENT: Negative for congestion, hearing loss, sinus pressure and sinus pain.        Phonophobia  Eyes: Positive for photophobia.  Respiratory: Negative for cough, chest tightness and shortness of breath.   Cardiovascular: Negative for chest pain, palpitations and leg swelling.  Gastrointestinal: Positive for nausea and vomiting. Negative for abdominal pain.  Neurological: Positive for headaches. Negative for dizziness, tremors, seizures, syncope, facial asymmetry, speech difficulty, weakness, light-headedness and numbness.  Psychiatric/Behavioral: Positive for sleep disturbance.  All other systems reviewed and are negative.       Objective:    BP 107/79   Pulse 70   Temp 97.7 F (36.5 C) (Oral)   Ht 5\' 5"  (1.651 m)   Wt 132 lb (59.9 kg)   BMI 21.97 kg/m    Wt Readings from Last 3 Encounters:  10/11/17 132 lb (59.9 kg)  05/18/17 127 lb 4 oz (57.7 kg)  05/09/17 128 lb 9.6 oz (58.3 kg)    Physical Exam  Constitutional: She is oriented to person, place, and time. She appears well-developed and well-nourished. No distress.  HENT:  Head: Normocephalic and atraumatic.  Eyes: Pupils are equal, round, and reactive to light. Conjunctivae and EOM are normal.  Cardiovascular:  Normal rate, regular rhythm and normal heart sounds.  Pulmonary/Chest: Effort normal and breath sounds normal.  Neurological: She is alert and oriented to person, place, and time. GCS eye subscore is 4. GCS verbal subscore is 5. GCS motor subscore is 6.  Skin: Skin is warm and dry. Capillary refill takes less than 2 seconds.  Psychiatric: She has a normal mood and affect. Her speech is normal and behavior is normal. Judgment and thought content normal. Cognition and memory are normal.  Nursing note and vitals reviewed.       Assessment & Plan:   1. Migraine with aura and without status migrainosus, not intractable  Pt has history of PUD and does not take oral NSAIDS, pt to try over the counter Excedrin tension headache, as it only has acetaminophen and caffeine. Will give IM Toradol today. Keep a headache diary and report any new or worsening symptoms.  - ketorolac (TORADOL) injection 60 mg - promethazine (PHENERGAN) 25 MG tablet; Take 1 tablet (25 mg total) by mouth every 8 (eight) hours as needed for nausea or vomiting.  Dispense: 20 tablet; Refill: 0  2. Nausea Medications as prescribed. - promethazine (PHENERGAN) 25 MG tablet; Take 1 tablet (25 mg total) by mouth every 8 (eight) hours as needed for nausea or vomiting.  Dispense: 20 tablet; Refill: 0   Continue all other maintenance medications.  Follow up plan: Return if symptoms worsen or fail to improve.  Educational handout given for migraine headache  The above assessment and management plan was discussed with the patient. The patient verbalized understanding of and has agreed to the management plan. Patient is aware to call the clinic if symptoms persist or worsen. Patient is aware when to return to the clinic for a follow-up visit. Patient educated on when it is appropriate to go to the emergency department.   Kari Baars, FNP-C [facility] [phone]

## 2017-10-11 NOTE — Patient Instructions (Signed)

## 2018-02-04 ENCOUNTER — Encounter: Payer: Self-pay | Admitting: Nurse Practitioner

## 2018-02-04 ENCOUNTER — Ambulatory Visit: Payer: BC Managed Care – PPO | Admitting: Nurse Practitioner

## 2018-02-04 VITALS — BP 103/70 | HR 73 | Temp 98.2°F | Ht 65.0 in | Wt 135.0 lb

## 2018-02-04 DIAGNOSIS — J029 Acute pharyngitis, unspecified: Secondary | ICD-10-CM

## 2018-02-04 NOTE — Patient Instructions (Signed)
Sore Throat  When you have a sore throat, your throat may feel:  · Tender.  · Burning.  · Irritated.  · Scratchy.  · Painful when you swallow.  · Painful when you talk.  Many things can cause a sore throat, such as:  · An infection.  · Allergies.  · Dry air.  · Smoke or pollution.  · Radiation treatment.  · Gastroesophageal reflux disease (GERD).  · A tumor.  A sore throat can be the first sign of another sickness. It can happen with other problems, like:  · Coughing.  · Sneezing.  · Fever.  · Swelling in the neck.  Most sore throats go away without treatment.  Follow these instructions at home:         · Take over-the-counter medicines only as told by your doctor.  ? If your child has a sore throat, do not give your child aspirin.  · Drink enough fluids to keep your pee (urine) pale yellow.  · Rest when you feel you need to.  · To help with pain:  ? Sip warm liquids, such as broth, herbal tea, or warm water.  ? Eat or drink cold or frozen liquids, such as frozen ice pops.  ? Gargle with a salt-water mixture 3-4 times a day or as needed. To make a salt-water mixture, add ½-1 tsp (3-6 g) of salt to 1 cup (237 mL) of warm water. Mix it until you cannot see the salt anymore.  ? Suck on hard candy or throat lozenges.  ? Put a cool-mist humidifier in your bedroom at night.  ? Sit in the bathroom with the door closed for 5-10 minutes while you run hot water in the shower.  · Do not use any products that contain nicotine or tobacco, such as cigarettes, e-cigarettes, and chewing tobacco. If you need help quitting, ask your doctor.  · Wash your hands well and often with soap and water. If soap and water are not available, use hand sanitizer.  Contact a doctor if:  · You have a fever for more than 2-3 days.  · You keep having symptoms for more than 2-3 days.  · Your throat does not get better in 7 days.  · You have a fever and your symptoms suddenly get worse.  · Your child who is 3 months to 3 years old has a temperature of  102.2°F (39°C) or higher.  Get help right away if:  · You have trouble breathing.  · You cannot swallow fluids, soft foods, or your saliva.  · You have swelling in your throat or neck that gets worse.  · You keep feeling sick to your stomach (nauseous).  · You keep throwing up (vomiting).  Summary  · A sore throat is pain, burning, irritation, or scratchiness in the throat. Many things can cause a sore throat.  · Take over-the-counter medicines only as told by your doctor. Do not give your child aspirin.  · Drink plenty of fluids, and rest as needed.  · Contact a doctor if your symptoms get worse or your sore throat does not get better within 7 days.  This information is not intended to replace advice given to you by your health care provider. Make sure you discuss any questions you have with your health care provider.  Document Released: 10/07/2007 Document Revised: 05/30/2017 Document Reviewed: 05/30/2017  Elsevier Interactive Patient Education © 2019 [employer]

## 2018-02-04 NOTE — Progress Notes (Signed)
   Subjective:    Patient ID: Tonya Lee, female    DOB: [birth year], 33 y.o.   MRN: [id number]   Chief Complaint: sore throat  HPI Patient comes in today c/o sore throat. Started all the sudden Wednesday evening. Has gradually gotten worse. Has developed a headache and slight nausea.   Review of Systems  Constitutional: Positive for appetite change (decreased). Negative for chills and fever.  HENT: Positive for congestion, sore throat and trouble swallowing. Negative for voice change.   Respiratory: Negative for cough.   Gastrointestinal: Positive for nausea.  Neurological: Positive for headaches.  Psychiatric/Behavioral: Negative.   All other systems reviewed and are negative.      Objective:   Physical Exam Constitutional:      General: She is not in acute distress.    Appearance: She is normal weight.  HENT:     Right Ear: Hearing, tympanic membrane, ear canal and external ear normal.     Left Ear: Hearing, tympanic membrane, ear canal and external ear normal.     Nose: Mucosal edema, congestion and rhinorrhea present.     Right Turbinates: Pale.     Left Turbinates: Pale.     Right Sinus: No maxillary sinus tenderness or frontal sinus tenderness.     Left Sinus: No maxillary sinus tenderness or frontal sinus tenderness.     Mouth/Throat:     Mouth: Mucous membranes are moist.     Pharynx: Posterior oropharyngeal erythema present.  Neck:     Musculoskeletal: Normal range of motion.  Cardiovascular:     Rate and Rhythm: Normal rate and regular rhythm.     Heart sounds: Normal heart sounds.  Pulmonary:     Breath sounds: Normal breath sounds.  Abdominal:     General: Abdomen is flat.  Skin:    General: Skin is warm.  Neurological:     General: No focal deficit present.     Mental Status: She is alert and oriented to person, place, and time.  Psychiatric:        Mood and Affect: Mood normal.        Behavior: Behavior normal.   BP 103/70   Pulse 73   Temp  98.2 F (36.8 C) (Oral)   Ht 5\' 5"  (1.651 m)   Wt 135 lb (61.2 kg)   BMI 22.47 kg/m   Strep negative        Assessment & Plan:  Lasheika Katherina RightDenny in today with chief complaint of No chief complaint on file.   1. Sore throat - Rapid Strep Screen ([facility] ONLY)  2. Viral pharyngitis Force fluids Motrin or tylenol OTC OTC decongestant Throat lozenges if help New toothbrush in 3 days  Mary-Margaret Daphine DeutscherMartin, FNP

## 2018-02-06 ENCOUNTER — Encounter: Payer: Self-pay | Admitting: Family Medicine

## 2018-02-06 ENCOUNTER — Ambulatory Visit: Payer: BC Managed Care – PPO | Admitting: Family Medicine

## 2018-02-06 VITALS — BP 101/70 | HR 66 | Temp 98.3°F | Ht 65.0 in | Wt 136.1 lb

## 2018-02-06 DIAGNOSIS — J329 Chronic sinusitis, unspecified: Secondary | ICD-10-CM

## 2018-02-06 DIAGNOSIS — J40 Bronchitis, not specified as acute or chronic: Secondary | ICD-10-CM

## 2018-02-06 DIAGNOSIS — J4531 Mild persistent asthma with (acute) exacerbation: Secondary | ICD-10-CM | POA: Diagnosis not present

## 2018-02-06 LAB — CULTURE, GROUP A STREP

## 2018-02-06 LAB — RAPID STREP SCREEN (MED CTR MEBANE ONLY): Strep Gp A Ag, IA W/Reflex: NEGATIVE

## 2018-02-06 MED ORDER — AMOXICILLIN-POT CLAVULANATE 875-125 MG PO TABS: 1.0000 | ORAL_TABLET | Freq: Two times a day (BID) | ORAL | 0 refills | Status: DC

## 2018-02-06 MED ORDER — PSEUDOEPHEDRINE-GUAIFENESIN ER 120-1200 MG PO TB12: 1.0000 | ORAL_TABLET | Freq: Two times a day (BID) | ORAL | 0 refills | Status: DC

## 2018-02-06 MED ORDER — ALBUTEROL SULFATE HFA 108 (90 BASE) MCG/ACT IN AERS: 2.0000 | INHALATION_SPRAY | Freq: Four times a day (QID) | RESPIRATORY_TRACT | 6 refills | Status: DC | PRN

## 2018-02-06 NOTE — Progress Notes (Signed)
Chief Complaint  Patient presents with  . Sore Throat    pt here today c/o sore throat, cough, ears hurting and headache   Chief Complaint  Patient presents with  . Sore Throat    pt here today c/o sore throat, cough, ears hurting and headache    HPI  Patient presents today for Patient presents with upper respiratory congestion. Rhinorrhea that is frequently purulent. There is moderate sore throat. Patient reports coughing frequently as well.  purulent sputum noted. There is no fever, chills or sweats. The patient denies being short of breath. Onset was 3-5 days ago. Gradually worsening. Tried OTCs without improvement.  PMH: Smoking status noted ROS: Per HPI  Objective: BP 101/70   Pulse 66   Temp 98.3 F (36.8 C) (Oral)   Ht 5\' 5"  (1.651 m)   Wt 136 lb 2 oz (61.7 kg)   BMI 22.65 kg/m  Gen: NAD, alert, cooperative with exam HEENT: NCAT, Nasal passages swollen, red TMS RED CV: RRR, good S1/S2, no murmur Resp: Bronchitis changes with scattered wheezes, non-labored Ext: No edema, warm Neuro: Alert and oriented, No gross deficits  Assessment and plan:  No diagnosis found.  Meds ordered this encounter  Medications  . amoxicillin-clavulanate (AUGMENTIN) 875-125 MG tablet    Sig: Take 1 tablet by mouth 2 (two) times daily. Take all of this medication    Dispense:  20 tablet    Refill:  0  . Pseudoephedrine-Guaifenesin [phone] MG TB12    Sig: Take 1 tablet by mouth 2 (two) times daily. For congestion    Dispense:  20 each    Refill:  0      Follow up as needed.  Mechele Claude, MD

## 2018-07-03 ENCOUNTER — Encounter: Payer: Self-pay | Admitting: Family Medicine

## 2018-07-03 ENCOUNTER — Other Ambulatory Visit: Payer: Self-pay

## 2018-07-03 ENCOUNTER — Ambulatory Visit (INDEPENDENT_AMBULATORY_CARE_PROVIDER_SITE_OTHER): Payer: BC Managed Care – PPO | Admitting: Family Medicine

## 2018-07-03 ENCOUNTER — Telehealth: Payer: Self-pay | Admitting: *Deleted

## 2018-07-03 DIAGNOSIS — Z20822 Contact with and (suspected) exposure to covid-19: Secondary | ICD-10-CM

## 2018-07-03 DIAGNOSIS — R6889 Other general symptoms and signs: Secondary | ICD-10-CM

## 2018-07-03 NOTE — Telephone Encounter (Signed)
-----   Message from Baruch Gouty, FNP sent at 07/03/2018  3:13 PM EDT ----- Pt exposed to COVID-19 pt. Pt now having headache, cough, and fatigue.   I feel she may have COVID-19 and would like her tested.    Tonya Lee MR [id number] DOB Jan 07, 1986  [facility] [place]  [id number] DEPT OF PUBLIC SAFETY Primary Visit Coverage Subscriber  ID Name [facility] Address [id number] [place] [id number]    [place], [place] [address]

## 2018-07-03 NOTE — Progress Notes (Signed)
Virtual Visit via telephone Note Due to COVID-19, visit is conducted virtually and was requested by patient. This visit type was conducted due to national recommendations for restrictions regarding the COVID-19 Pandemic (e.g. social distancing) in an effort to limit this patient's exposure and mitigate transmission in our community. All issues noted in this document were discussed and addressed.  A physical exam was not performed with this format.   I connected with Tonya Lee on 07/03/18 at 1500 by telephone and verified that I am speaking with the correct person using two identifiers. [name] is currently located at home and family is currently with them during visit. The provider, Kari BaarsMichelle Rakes, FNP is located in their office at time of visit.  I discussed the limitations, risks, security and privacy concerns of performing an evaluation and management service by telephone and the availability of in person appointments. I also discussed with the patient that there may be a patient responsible charge related to this service. The patient expressed understanding and agreed to proceed.  Subjective:  Patient ID: Tonya BlowDestinie Lee, female    DOB: Oct 22, 1985, 33 y.o.   MRN: [id number]  Chief Complaint:  Cough (COVID-19 exposure), Fatigue, and Headache   HPI: Tonya Lee is a 33 y.o. female presenting on 07/03/2018 for Cough (COVID-19 exposure), Fatigue, and Headache   Pt reports headache, fatigue, and cough. States this started on Saturday. States she was working with someone last week that has been tested for COVID-19. She reports she was exposed to them a week ago. She denies shortness of breath, measured fever, chills, sputum production, or weakness. States she just has a headache that is controlled with tylenol, slight cough, and fatigue.   Cough This is a new problem. The current episode started in the past 7 days. The problem has been unchanged. The problem occurs every few  minutes. The cough is non-productive. Associated symptoms include headaches. Pertinent negatives include no chest pain, chills, ear congestion, ear pain, eye redness, fever, heartburn, hemoptysis, myalgias, nasal congestion, postnasal drip, rash, rhinorrhea, sore throat, shortness of breath, sweats, weight loss or wheezing. Nothing aggravates the symptoms. She has tried nothing for the symptoms. Her past medical history is significant for asthma.  Headache  This is a new problem. The current episode started in the past 7 days. The problem has been waxing and waning. The pain is located in the frontal region. The pain does not radiate. The pain quality is similar to prior headaches. The quality of the pain is described as aching. The pain is at a severity of 3/10. The pain is mild. Associated symptoms include coughing. Pertinent negatives include no abdominal pain, abnormal behavior, anorexia, back pain, blurred vision, dizziness, drainage, ear pain, eye pain, eye redness, eye watering, facial sweating, fever, hearing loss, insomnia, loss of balance, muscle aches, nausea, neck pain, numbness, phonophobia, photophobia, rhinorrhea, scalp tenderness, seizures, sinus pressure, sore throat, swollen glands, tingling, tinnitus, visual change, vomiting, weakness or weight loss. Nothing aggravates the symptoms. She has tried acetaminophen for the symptoms. The treatment provided significant relief. Her past medical history is significant for migraine headaches.     Relevant past medical, surgical, family, and social history reviewed and updated as indicated.  Allergies and medications reviewed and updated.   Past Medical History:  Diagnosis Date  . 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 GERD (gastroesophageal reflux disease)   . Migraines   . Peptic ulcer disease     Past Surgical History:  Procedure  Laterality Date  . ANKLE SURGERY Right july 2013  . FASCIECTOMY Right    right lower leg   . KNEE SURGERY      Social History   Socioeconomic History  . Marital status: Married    Spouse name: Not on file  . Number of children: Not on file  . Years of education: Not on file  . Highest education level: Not on file  Occupational History  . Not on file  Social Needs  . Financial resource strain: Not on file  . Food insecurity    Worry: Not on file    Inability: Not on file  . Transportation needs    Medical: Not on file    Non-medical: Not on file  Tobacco Use  . Smoking status: Never Smoker  . Smokeless tobacco: Never Used  Substance and Sexual Activity  . Alcohol use: No  . Drug use: No  . Sexual activity: Yes    Birth control/protection: Pill  Lifestyle  . Physical activity    Days per week: Not on file    Minutes per session: Not on file  . Stress: Not on file  Relationships  . Social Musicianconnections    Talks on phone: Not on file    Gets together: Not on file    Attends religious service: Not on file    Active member of club or organization: Not on file    Attends meetings of clubs or organizations: Not on file    Relationship status: Not on file  . Intimate partner violence    Fear of current or ex partner: Not on file    Emotionally abused: Not on file    Physically abused: Not on file    Forced sexual activity: Not on file  Other Topics Concern  . Not on file  Social History Narrative  . Not on file    Outpatient Encounter Medications as of 07/03/2018  Medication Sig  . albuterol (PROVENTIL HFA;VENTOLIN HFA) 108 (90 Base) MCG/ACT inhaler Inhale 2 puffs into the lungs every 6 (six) hours as needed for wheezing or shortness of breath.  . fluticasone (FLONASE) 50 MCG/ACT nasal spray Place into both nostrils daily.  . frovatriptan (FROVA) 2.5 MG tablet Take 1 tablet (2.5 mg total) by mouth as needed for migraine. If recurs, may repeat after 2 hours. Max of 3 tabs in 24 hours.  . Pseudoephedrine-Guaifenesin [phone] MG TB12 Take 1 tablet by mouth 2  (two) times daily. For congestion  . [DISCONTINUED] amoxicillin-clavulanate (AUGMENTIN) 875-125 MG tablet Take 1 tablet by mouth 2 (two) times daily. Take all of this medication   No facility-administered encounter medications on file as of 07/03/2018.     Allergies  Allergen Reactions  . Sulfa Antibiotics Anaphylaxis, Hives and Other (See Comments)    Throat and face swelled  . Acyclovir And Related Hives  . Ciprofloxacin   . Prednisone Nausea And Vomiting  . Nitrofuran Derivatives Nausea Only    Abdominal pain    Review of Systems  Constitutional: Positive for fatigue. Negative for activity change, chills, fever and weight loss.  HENT: Negative for ear pain, hearing loss, postnasal drip, rhinorrhea, sinus pressure, sore throat and tinnitus.   Eyes: Negative for blurred vision, photophobia, pain, redness and visual disturbance.  Respiratory: Positive for cough. Negative for hemoptysis, chest tightness, shortness of breath and wheezing.   Cardiovascular: Negative for chest pain and palpitations.  Gastrointestinal: Negative for abdominal pain, anorexia, heartburn, nausea and vomiting.  Genitourinary: Negative  for decreased urine volume and difficulty urinating.  Musculoskeletal: Negative for arthralgias, back pain, myalgias and neck pain.  Skin: Negative for rash.  Neurological: Positive for headaches. Negative for dizziness, tingling, tremors, seizures, syncope, facial asymmetry, speech difficulty, weakness, light-headedness, numbness and loss of balance.  Psychiatric/Behavioral: Negative for confusion. The patient does not have insomnia.   All other systems reviewed and are negative.        Observations/Objective: No vital signs or physical exam, this was a telephone or virtual health encounter.  Pt alert and oriented, answers all questions appropriately, and able to speak in full sentences.    Assessment and Plan: Cedrica was seen today for cough, fatigue and headache.   Diagnoses and all orders for this visit:  Suspected Covid-19 Virus Infection Exposure to partner that was tested and treated for COVID-19. Pt developed symptoms on Saturday. Symptomatic care discussed. Will refer to [facility] for testing. Report any new or worsening symptoms. Pt aware to self quarantine and to remain self quarantined for at least 72 hours without symptoms / without medications. Pt does have MyChart and will partake in monitoring.     Follow Up Instructions: Return if symptoms worsen or fail to improve.    I discussed the assessment and treatment plan with the patient. The patient was provided an opportunity to ask questions and all were answered. The patient agreed with the plan and demonstrated an understanding of the instructions.   The patient was advised to call back or seek an in-person evaluation if the symptoms worsen or if the condition fails to improve as anticipated.  The above assessment and management plan was discussed with the patient. The patient verbalized understanding of and has agreed to the management plan. Patient is aware to call the clinic if symptoms persist or worsen. Patient is aware when to return to the clinic for a follow-up visit. Patient educated on when it is appropriate to go to the emergency department.    I provided 15 minutes of non-face-to-face time during this encounter. The call started at 1500. The call ended at 1515. The other time was used for coordination of care.    Monia Pouch, FNP-C [facility] [address] [place],  [address] [phone]

## 2018-07-03 NOTE — Telephone Encounter (Signed)
Pt scheduled for covid testing tomorrow @ [facility] @ 8:00am. Instructions given and orders placed.

## 2018-07-04 ENCOUNTER — Other Ambulatory Visit: Payer: Self-pay

## 2018-07-04 DIAGNOSIS — Z20822 Contact with and (suspected) exposure to covid-19: Secondary | ICD-10-CM

## 2018-07-07 ENCOUNTER — Encounter: Payer: Self-pay | Admitting: Family Medicine

## 2018-07-07 ENCOUNTER — Ambulatory Visit (INDEPENDENT_AMBULATORY_CARE_PROVIDER_SITE_OTHER): Payer: BC Managed Care – PPO | Admitting: Family Medicine

## 2018-07-07 ENCOUNTER — Other Ambulatory Visit: Payer: Self-pay | Admitting: Family Medicine

## 2018-07-07 ENCOUNTER — Telehealth: Payer: Self-pay | Admitting: Family Medicine

## 2018-07-07 ENCOUNTER — Other Ambulatory Visit: Payer: Self-pay

## 2018-07-07 DIAGNOSIS — R0602 Shortness of breath: Secondary | ICD-10-CM | POA: Diagnosis not present

## 2018-07-07 DIAGNOSIS — J4531 Mild persistent asthma with (acute) exacerbation: Secondary | ICD-10-CM | POA: Diagnosis not present

## 2018-07-07 DIAGNOSIS — R05 Cough: Secondary | ICD-10-CM | POA: Diagnosis not present

## 2018-07-07 DIAGNOSIS — R059 Cough, unspecified: Secondary | ICD-10-CM

## 2018-07-07 LAB — NOVEL CORONAVIRUS, NAA: SARS-CoV-2, NAA: NOT DETECTED

## 2018-07-07 MED ORDER — TIOTROPIUM BROMIDE MONOHYDRATE 18 MCG IN CAPS: 18.0000 ug | ORAL_CAPSULE | Freq: Every day | RESPIRATORY_TRACT | 12 refills | Status: DC

## 2018-07-07 MED ORDER — BENZONATATE 100 MG PO CAPS: 100.0000 mg | ORAL_CAPSULE | Freq: Three times a day (TID) | ORAL | 0 refills | Status: DC | PRN

## 2018-07-07 MED ORDER — IPRATROPIUM BROMIDE HFA 17 MCG/ACT IN AERS: 2.0000 | INHALATION_SPRAY | RESPIRATORY_TRACT | 12 refills | Status: DC | PRN

## 2018-07-07 NOTE — Telephone Encounter (Signed)
Atrovent was going to cost her $400.00

## 2018-07-07 NOTE — Progress Notes (Signed)
Virtual Visit via telephone Note Due to COVID-19, visit is conducted virtually and was requested by patient. This visit type was conducted due to national recommendations for restrictions regarding the COVID-19 Pandemic (e.g. social distancing) in an effort to limit this patient's exposure and mitigate transmission in our community. All issues noted in this document were discussed and addressed.  A physical exam was not performed with this format.   I connected with Tonya Lee on 07/07/18 at 1000 by telephone and verified that I am speaking with the correct person using two identifiers. Tonya Lee is currently located at home and no one is currently with them during visit. The provider, Monia Pouch, FNP is located in their office at time of visit.  I discussed the limitations, risks, security and privacy concerns of performing an evaluation and management service by telephone and the availability of in person appointments. I also discussed with the patient that there may be a patient responsible charge related to this service. The patient expressed understanding and agreed to proceed.  Subjective:  Patient ID: Tonya Lee, female    DOB: July 28, 1985, 33 y.o.   MRN: [id number]  Chief Complaint:  Cough and Shortness of Breath   HPI: Laqueta Klich is a 33 y.o. female presenting on 07/07/2018 for Cough and Shortness of Breath   Pt reports ongoing cough and intermittent shortness of breath. States the shortness of breath is with exertion and when coughing. This started on 07/01/2018. She had a negative COVID-19 test. States she continues to cough. Nonproductive, dry cough with wheezing. No fever, chills, headache, or body aches. No fatigue, confusion, or weakness.   Cough This is a recurrent problem. The current episode started in the past 7 days. The problem has been waxing and waning. The problem occurs every few minutes. The cough is non-productive. Associated symptoms include  shortness of breath and wheezing. Pertinent negatives include no chest pain, chills, ear congestion, ear pain, fever, headaches, heartburn, hemoptysis, myalgias, nasal congestion, postnasal drip, rash, rhinorrhea, sore throat, sweats or weight loss. The symptoms are aggravated by exercise. She has tried a beta-agonist inhaler for the symptoms. The treatment provided mild relief. Her past medical history is significant for asthma.  Shortness of Breath Associated symptoms include wheezing. Pertinent negatives include no abdominal pain, chest pain, ear pain, fever, headaches, hemoptysis, leg swelling, rash, rhinorrhea or sore throat. Her past medical history is significant for asthma.     Relevant past medical, surgical, family, and social history reviewed and updated as indicated.  Allergies and medications reviewed and updated.   Past Medical History:  Diagnosis Date  . Compartment syndrome of right lower extremity ([place] [place])   . GERD (gastroesophageal reflux disease)   . Migraines   . Peptic ulcer disease     Past Surgical History:  Procedure Laterality Date  . ANKLE SURGERY Right july 2013  . FASCIECTOMY Right    right lower leg  . KNEE SURGERY      Social History   Socioeconomic History  . Marital status: Married    Spouse name: Not on file  . Number of children: Not on file  . Years of education: Not on file  . Highest education level: Not on file  Occupational History  . Not on file  Social Needs  . Financial resource strain: Not on file  . Food insecurity    Worry: Not on file    Inability: Not on file  . Transportation needs    Medical: Not on file  Non-medical: Not on file  Tobacco Use  . Smoking status: Never Smoker  . Smokeless tobacco: Never Used  Substance and Sexual Activity  . Alcohol use: No  . Drug use: No  . Sexual activity: Yes    Birth control/protection: Pill  Lifestyle  . Physical activity    Days per week: Not on file    Minutes per session:  Not on file  . Stress: Not on file  Relationships  . Social Musicianconnections    Talks on phone: Not on file    Gets together: Not on file    Attends religious service: Not on file    Active member of club or organization: Not on file    Attends meetings of clubs or organizations: Not on file    Relationship status: Not on file  . Intimate partner violence    Fear of current or ex partner: Not on file    Emotionally abused: Not on file    Physically abused: Not on file    Forced sexual activity: Not on file  Other Topics Concern  . Not on file  Social History Narrative  . Not on file    Outpatient Encounter Medications as of 07/07/2018  Medication Sig  . albuterol (PROVENTIL HFA;VENTOLIN HFA) 108 (90 Base) MCG/ACT inhaler Inhale 2 puffs into the lungs every 6 (six) hours as needed for wheezing or shortness of breath.  . benzonatate (TESSALON PERLES) 100 MG capsule Take 1 capsule (100 mg total) by mouth 3 (three) times daily as needed.  . fluticasone (FLONASE) 50 MCG/ACT nasal spray Place into both nostrils daily.  . frovatriptan (FROVA) 2.5 MG tablet Take 1 tablet (2.5 mg total) by mouth as needed for migraine. If recurs, may repeat after 2 hours. Max of 3 tabs in 24 hours.  Marland Kitchen. ipratropium (ATROVENT HFA) 17 MCG/ACT inhaler Inhale 2 puffs into the lungs every 4 (four) hours as needed for wheezing.  . Pseudoephedrine-Guaifenesin [phone] MG TB12 Take 1 tablet by mouth 2 (two) times daily. For congestion   No facility-administered encounter medications on file as of 07/07/2018.     Allergies  Allergen Reactions  . Sulfa Antibiotics Anaphylaxis, Hives and Other (See Comments)    Throat and face swelled  . Acyclovir And Related Hives  . Ciprofloxacin   . Prednisone Nausea And Vomiting  . Nitrofuran Derivatives Nausea Only    Abdominal pain    Review of Systems  Constitutional: Negative for activity change, appetite change, chills, diaphoresis, fatigue, fever, unexpected weight change  and weight loss.  HENT: Negative for congestion, ear pain, postnasal drip, rhinorrhea and sore throat.   Respiratory: Positive for cough, shortness of breath and wheezing. Negative for hemoptysis and chest tightness.   Cardiovascular: Negative for chest pain, palpitations and leg swelling.  Gastrointestinal: Negative for abdominal pain and heartburn.  Musculoskeletal: Negative for arthralgias and myalgias.  Skin: Negative for rash.  Neurological: Negative for weakness and headaches.  Psychiatric/Behavioral: Negative for confusion.  All other systems reviewed and are negative.        Observations/Objective: No vital signs or physical exam, this was a telephone or virtual health encounter.  Pt alert and oriented, answers all questions appropriately, and able to speak in full sentences.    Assessment and Plan: Diagnoses and all orders for this visit:  Cough Shortness of breath Mild persistent asthma with acute exacerbation COVID-19 test negative. Continued cough with wheezing and intermittent shortness of breath. Likely asthma exacerbation. Allergic to steroids, so unable to  add ICS inhaler or prednisone burst. Will add ipratropium inhaler. Pt aware to report any new or worsening symptoms. Medications as prescribed.  -     ipratropium (ATROVENT HFA) 17 MCG/ACT inhaler; Inhale 2 puffs into the lungs every 4 (four) hours as needed for wheezing. -     benzonatate (TESSALON PERLES) 100 MG capsule; Take 1 capsule (100 mg total) by mouth 3 (three) times daily as needed.    Follow Up Instructions: Return in about 2 weeks (around 07/21/2018), or if symptoms worsen or fail to improve, for Asthma.    I discussed the assessment and treatment plan with the patient. The patient was provided an opportunity to ask questions and all were answered. The patient agreed with the plan and demonstrated an understanding of the instructions.   The patient was advised to call back or seek an in-person  evaluation if the symptoms worsen or if the condition fails to improve as anticipated.  The above assessment and management plan was discussed with the patient. The patient verbalized understanding of and has agreed to the management plan. Patient is aware to call the clinic if symptoms persist or worsen. Patient is aware when to return to the clinic for a follow-up visit. Patient educated on when it is appropriate to go to the emergency department.    I provided 15 minutes of non-face-to-face time during this encounter. The call started at 1000. The call ended at 1015. The other time was used for coordination of care.    Kari BaarsMichelle Jakaylee Sasaki, FNP-C [facility] [address] [place], [place] [id number] [phone]

## 2018-07-07 NOTE — Telephone Encounter (Signed)
Putting in telephone call with Tonya Lee

## 2018-07-26 ENCOUNTER — Encounter: Payer: Self-pay | Admitting: Family Medicine

## 2018-08-31 ENCOUNTER — Encounter: Payer: Self-pay | Admitting: Family Medicine

## 2018-08-31 ENCOUNTER — Ambulatory Visit (INDEPENDENT_AMBULATORY_CARE_PROVIDER_SITE_OTHER): Payer: BC Managed Care – PPO | Admitting: Family Medicine

## 2018-08-31 ENCOUNTER — Ambulatory Visit: Payer: BC Managed Care – PPO

## 2018-08-31 ENCOUNTER — Other Ambulatory Visit: Payer: Self-pay

## 2018-08-31 DIAGNOSIS — G43101 Migraine with aura, not intractable, with status migrainosus: Secondary | ICD-10-CM

## 2018-08-31 MED ORDER — PROMETHAZINE HCL 25 MG/ML IJ SOLN
25.0000 mg | Freq: Once | INTRAMUSCULAR | Status: AC
  Administered 2018-08-31: 25 mg via INTRAMUSCULAR

## 2018-08-31 MED ORDER — KETOROLAC TROMETHAMINE 30 MG/ML IJ SOLN
30.0000 mg | Freq: Once | INTRAMUSCULAR | Status: AC
  Administered 2018-08-31: 15:00:00 30 mg via INTRAMUSCULAR

## 2018-08-31 NOTE — Progress Notes (Signed)
Virtual Visit via Telephone Note  I connected with Tonya Lee on 08/31/18 at 1:13 PM by telephone and verified that I am speaking with the correct person using two identifiers. Tonya Lee is currently located at home and nobody is currently with her during this visit. The provider, Gwenlyn FudgeBRITNEY F Kara Melching, FNP is located in their home at time of visit.  I discussed the limitations, risks, security and privacy concerns of performing an evaluation and management service by telephone and the availability of in person appointments. I also discussed with the patient that there may be Lee patient responsible charge related to this service. The patient expressed understanding and agreed to proceed.  Subjective: PCP: Dettinger, Tonya RadonJoshua A, MD  Chief Complaint  Patient presents with  . Migraine   Patient has Lee history of migraines since she was Lee teenager. She has not had Lee bad one in "Lee long time". This is the third day and she reports it has never hurt this bad. This one "ebbs and wanes". Two nights in Lee row she almost had her husband take her to the ER. The pain is mostly around her left eye down to the left side of her neck. She reports this is not similar to her previous migraines. No known injury. She was unable to go to work today and had to leave early yesterday.    ROS: Per HPI  Current Outpatient Medications:  .  albuterol (PROVENTIL HFA;VENTOLIN HFA) 108 (90 Base) MCG/ACT inhaler, Inhale 2 puffs into the lungs every 6 (six) hours as needed for wheezing or shortness of breath., Disp: 1 Inhaler, Rfl: 6 .  fluticasone (FLONASE) 50 MCG/ACT nasal spray, Place into both nostrils daily., Disp: , Rfl:  .  frovatriptan (FROVA) 2.5 MG tablet, Take 1 tablet (2.5 mg total) by mouth as needed for migraine. If recurs, may repeat after 2 hours. Max of 3 tabs in 24 hours., Disp: 10 tablet, Rfl: 2 .  tiotropium (SPIRIVA) 18 MCG inhalation capsule, Place 1 capsule (18 mcg total) into inhaler and inhale daily  for 30 days., Disp: 30 capsule, Rfl: 12  Allergies  Allergen Reactions  . Sulfa Antibiotics Anaphylaxis, Hives and Other (See Comments)    Throat and face swelled  . Acyclovir And Related Hives  . Ciprofloxacin   . Prednisone Nausea And Vomiting  . Nitrofuran Derivatives Nausea Only    Abdominal pain   Past Medical History:  Diagnosis Date  . 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 GERD (gastroesophageal reflux disease)   . Migraines   . Peptic ulcer disease     Observations/Objective: Lee&O  No respiratory distress or wheezing audible over the phone Mood, judgement, and thought processes all WNL  Assessment and Plan: 1. Migraine with aura and with status migrainosus, not intractable - Discussed that if this is the worst headache she has ever had she should go to the ER. She would like to try the Toradol and Phenergan first and if it does not relieve the headache she will have her husband take her to the ER.  - ketorolac (TORADOL) 30 MG/ML injection 30 mg - promethazine (PHENERGAN) injection 25 mg   Follow Up Instructions:  I discussed the assessment and treatment plan with the patient. The patient was provided an opportunity to ask questions and all were answered. The patient agreed with the plan and demonstrated an understanding of the instructions.   The patient was advised to call back or seek an in-person evaluation if  the symptoms worsen or if the condition fails to improve as anticipated.  The above assessment and management plan was discussed with the patient. The patient verbalized understanding of and has agreed to the management plan. Patient is aware to call the clinic if symptoms persist or worsen. Patient is aware when to return to the clinic for Lee follow-up visit. Patient educated on when it is appropriate to go to the emergency department.   Time call ended: 1:25 PM  I provided 16 minutes of non-face-to-face time during this encounter.   Hendricks Limes, MSN, APRN, FNP-C [facility] 08/31/18

## 2018-09-27 ENCOUNTER — Ambulatory Visit: Payer: BC Managed Care – PPO | Admitting: Family Medicine

## 2018-09-28 ENCOUNTER — Ambulatory Visit (INDEPENDENT_AMBULATORY_CARE_PROVIDER_SITE_OTHER): Payer: BC Managed Care – PPO | Admitting: Family Medicine

## 2018-09-28 ENCOUNTER — Encounter: Payer: Self-pay | Admitting: Family Medicine

## 2018-09-28 DIAGNOSIS — H938X3 Other specified disorders of ear, bilateral: Secondary | ICD-10-CM | POA: Diagnosis not present

## 2018-09-28 DIAGNOSIS — R110 Nausea: Secondary | ICD-10-CM | POA: Diagnosis not present

## 2018-09-28 DIAGNOSIS — H8113 Benign paroxysmal vertigo, bilateral: Secondary | ICD-10-CM

## 2018-09-28 MED ORDER — FLUTICASONE PROPIONATE 50 MCG/ACT NA SUSP: 2.0000 | Freq: Every day | NASAL | 6 refills | Status: DC

## 2018-09-28 MED ORDER — ONDANSETRON HCL 4 MG PO TABS: 4.0000 mg | ORAL_TABLET | Freq: Three times a day (TID) | ORAL | 0 refills | Status: DC | PRN

## 2018-09-28 MED ORDER — MECLIZINE HCL 25 MG PO TABS: 25.0000 mg | ORAL_TABLET | Freq: Three times a day (TID) | ORAL | 0 refills | Status: DC | PRN

## 2018-09-28 MED ORDER — LORATADINE 10 MG PO TABS: 10.0000 mg | ORAL_TABLET | Freq: Every day | ORAL | 11 refills | Status: DC

## 2018-09-28 NOTE — Progress Notes (Signed)
Virtual Visit via telephone Note Due to COVID-19 pandemic this visit was conducted virtually. This visit type was conducted due to national recommendations for restrictions regarding the COVID-19 Pandemic (e.g. social distancing, sheltering in place) in an effort to limit this patient's exposure and mitigate transmission in our community. All issues noted in this document were discussed and addressed.  A physical exam was not performed with this format.   I connected with Tonya Lee on 09/28/18 at 0755 by telephone and verified that I am speaking with the correct person using two identifiers. Tonya Lee is currently located at home and family is currently with them during visit. The provider, Kari BaarsMichelle , FNP is located in their office at time of visit.  I discussed the limitations, risks, security and privacy concerns of performing an evaluation and management service by telephone and the availability of in person appointments. I also discussed with the patient that there may be a patient responsible charge related to this service. The patient expressed understanding and agreed to proceed.  Subjective:  Patient ID: Tonya Lee, female    DOB: 1985-06-29, 33 y.o.   MRN: [id number]  Chief Complaint:  Dizziness, Nausea, and Ear Fullness   HPI: Tonya Lee is a 33 y.o. female presenting on 09/28/2018 for Dizziness, Nausea, and Ear Fullness   Pt reports symptoms of intermittent dizziness, nausea, ear fullness, and tinnitus for 2 days. No focal deficits, confusion, weakness, syncope, slurred speech, or facial droop with symptoms. Has not been taking Zyrtec and Flonase on a regular basis. Last took 3 weeks ago. No fever, chills, congestion, cough, shortness of breath, or ear pain.   Dizziness This is a new problem. The current episode started in the past 7 days. The problem occurs intermittently. The problem has been waxing and waning. Associated symptoms include nausea and vertigo.  Pertinent negatives include no abdominal pain, anorexia, arthralgias, change in bowel habit, chest pain, chills, congestion, coughing, diaphoresis, fatigue, fever, headaches, joint swelling, myalgias, neck pain, numbness, rash, sore throat, swollen glands, urinary symptoms, visual change, vomiting or weakness. The symptoms are aggravated by bending, standing, twisting and walking. She has tried nothing for the symptoms.  Ear Fullness  There is pain in both ears. This is a new problem. The current episode started in the past 7 days. The problem occurs constantly. The problem has been unchanged. There has been no fever. The pain is at a severity of 0/10. The patient is experiencing no pain. Pertinent negatives include no abdominal pain, coughing, diarrhea, ear discharge, headaches, hearing loss, neck pain, rash, rhinorrhea, sore throat or vomiting. She has tried nothing for the symptoms.     Relevant past medical, surgical, family, and social history reviewed and updated as indicated.  Allergies and medications reviewed and updated.   Past Medical History:  Diagnosis Date  . 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 GERD (gastroesophageal reflux disease)   . Migraines   . Peptic ulcer disease     Past Surgical History:  Procedure Laterality Date  . ANKLE SURGERY Right july 2013  . FASCIECTOMY Right    right lower leg  . KNEE SURGERY      Social History   Socioeconomic History  . Marital status: Married    Spouse name: Not on file  . Number of children: Not on file  . Years of education: Not on file  . Highest education level: Not on file  Occupational History  . Not on file  Social Needs  .  Financial resource strain: Not on file  . Food insecurity    Worry: Not on file    Inability: Not on file  . Transportation needs    Medical: Not on file    Non-medical: Not on file  Tobacco Use  . Smoking status: Never Smoker  . Smokeless tobacco: Never Used  Substance  and Sexual Activity  . Alcohol use: No  . Drug use: No  . Sexual activity: Yes    Birth control/protection: Pill  Lifestyle  . Physical activity    Days per week: Not on file    Minutes per session: Not on file  . Stress: Not on file  Relationships  . Social Musician on phone: Not on file    Gets together: Not on file    Attends religious service: Not on file    Active member of club or organization: Not on file    Attends meetings of clubs or organizations: Not on file    Relationship status: Not on file  . Intimate partner violence    Fear of current or ex partner: Not on file    Emotionally abused: Not on file    Physically abused: Not on file    Forced sexual activity: Not on file  Other Topics Concern  . Not on file  Social History Narrative  . Not on file    Outpatient Encounter Medications as of 09/28/2018  Medication Sig  . albuterol (PROVENTIL HFA;VENTOLIN HFA) 108 (90 Base) MCG/ACT inhaler Inhale 2 puffs into the lungs every 6 (six) hours as needed for wheezing or shortness of breath.  . fluticasone (FLONASE) 50 MCG/ACT nasal spray Place 2 sprays into both nostrils daily.  . frovatriptan (FROVA) 2.5 MG tablet Take 1 tablet (2.5 mg total) by mouth as needed for migraine. If recurs, may repeat after 2 hours. Max of 3 tabs in 24 hours.  Marland Kitchen loratadine (CLARITIN) 10 MG tablet Take 1 tablet (10 mg total) by mouth daily.  . meclizine (ANTIVERT) 25 MG tablet Take 1 tablet (25 mg total) by mouth 3 (three) times daily as needed for dizziness.  . ondansetron (ZOFRAN) 4 MG tablet Take 1 tablet (4 mg total) by mouth every 8 (eight) hours as needed for nausea or vomiting.  . tiotropium (SPIRIVA) 18 MCG inhalation capsule Place 1 capsule (18 mcg total) into inhaler and inhale daily for 30 days.  . [DISCONTINUED] fluticasone (FLONASE) 50 MCG/ACT nasal spray Place into both nostrils daily.   No facility-administered encounter medications on file as of 09/28/2018.      Allergies  Allergen Reactions  . Sulfa Antibiotics Anaphylaxis, Hives and Other (See Comments)    Throat and face swelled  . Acyclovir And Related Hives  . Ciprofloxacin   . Prednisone Nausea And Vomiting  . Nitrofuran Derivatives Nausea Only    Abdominal pain    Review of Systems  Constitutional: Negative for activity change, appetite change, chills, diaphoresis, fatigue, fever and unexpected weight change.  HENT: Positive for tinnitus. Negative for congestion, ear discharge, hearing loss, postnasal drip, rhinorrhea, sinus pressure, sinus pain, sore throat and trouble swallowing.   Eyes: Negative for photophobia and visual disturbance.  Respiratory: Negative for cough, chest tightness and shortness of breath.   Cardiovascular: Negative for chest pain, palpitations and leg swelling.  Gastrointestinal: Positive for nausea. Negative for abdominal pain, anal bleeding, anorexia, blood in stool, change in bowel habit, constipation, diarrhea and vomiting.  Genitourinary: Negative for decreased urine volume, difficulty urinating,  hematuria and vaginal bleeding.  Musculoskeletal: Positive for gait problem (with dizziness, feels off balance ). Negative for arthralgias, back pain, joint swelling, myalgias, neck pain and neck stiffness.  Skin: Negative for color change, pallor and rash.  Neurological: Positive for dizziness and vertigo. Negative for tremors, seizures, syncope, facial asymmetry, speech difficulty, weakness, light-headedness, numbness and headaches.  Hematological: Does not bruise/bleed easily.  Psychiatric/Behavioral: Negative for agitation and confusion.  All other systems reviewed and are negative.        Observations/Objective: No vital signs or physical exam, this was a telephone or virtual health encounter.  Pt alert and oriented, answers all questions appropriately, and able to speak in full sentences.    Assessment and Plan: Tonya Lee was seen today for dizziness,  nausea and ear fullness.  Diagnoses and all orders for this visit:  Benign paroxysmal positional vertigo due to bilateral vestibular disorder Reported symptoms consistent with vertigo. No red flags concerning for acute neurovascular event. Symptomatic care discussed. Slow position changes. Stay hydrated. Medications as prescribed. Report any new, worsening, or persistent symptoms.  -     meclizine (ANTIVERT) 25 MG tablet; Take 1 tablet (25 mg total) by mouth 3 (three) times daily as needed for dizziness.  Nausea in adult Nausea with vertigo. Zofran as needed for nausea and vomiting. Report any new or worsening symptoms.  -     ondansetron (ZOFRAN) 4 MG tablet; Take 1 tablet (4 mg total) by mouth every 8 (eight) hours as needed for nausea or vomiting.  Sensation of fullness in both ears Likely otitis media with effusion. No signs of acute bacterial infection. Has not been taking antihistamine and Flonase as prescribed. Will restart both today. Report any new or worsening symptoms.  -     fluticasone (FLONASE) 50 MCG/ACT nasal spray; Place 2 sprays into both nostrils daily. -     loratadine (CLARITIN) 10 MG tablet; Take 1 tablet (10 mg total) by mouth daily.     Follow Up Instructions: Return if symptoms worsen or fail to improve.    I discussed the assessment and treatment plan with the patient. The patient was provided an opportunity to ask questions and all were answered. The patient agreed with the plan and demonstrated an understanding of the instructions.   The patient was advised to call back or seek an in-person evaluation if the symptoms worsen or if the condition fails to improve as anticipated.  The above assessment and management plan was discussed with the patient. The patient verbalized understanding of and has agreed to the management plan. Patient is aware to call the clinic if they develop any new symptoms or if symptoms persist or worsen. Patient is aware when to return to  the clinic for a follow-up visit. Patient educated on when it is appropriate to go to the emergency department.    I provided 15 minutes of non-face-to-face time during this encounter. The call started at 0755. The call ended at 0810. The other time was used for coordination of care.    Monia Pouch, FNP-C [facility] [address] [place], [place] [address] [phone] 09/28/18

## 2018-10-30 ENCOUNTER — Other Ambulatory Visit: Payer: Self-pay

## 2018-10-30 DIAGNOSIS — Z20822 Contact with and (suspected) exposure to covid-19: Secondary | ICD-10-CM

## 2018-11-01 LAB — NOVEL CORONAVIRUS, NAA: SARS-CoV-2, NAA: NOT DETECTED

## 2019-01-09 ENCOUNTER — Ambulatory Visit: Payer: BC Managed Care – PPO | Attending: Internal Medicine

## 2019-01-09 DIAGNOSIS — R238 Other skin changes: Secondary | ICD-10-CM

## 2019-01-09 DIAGNOSIS — U071 COVID-19: Secondary | ICD-10-CM

## 2019-01-10 LAB — NOVEL CORONAVIRUS, NAA: SARS-CoV-2, NAA: NOT DETECTED

## 2019-06-05 ENCOUNTER — Other Ambulatory Visit: Payer: Self-pay | Admitting: *Deleted

## 2019-06-05 DIAGNOSIS — J4531 Mild persistent asthma with (acute) exacerbation: Secondary | ICD-10-CM

## 2019-06-05 MED ORDER — ALBUTEROL SULFATE HFA 108 (90 BASE) MCG/ACT IN AERS
2.0000 | INHALATION_SPRAY | Freq: Four times a day (QID) | RESPIRATORY_TRACT | 0 refills | Status: DC | PRN
Start: 2019-06-05 — End: 2020-08-01

## 2019-06-19 ENCOUNTER — Other Ambulatory Visit: Payer: Self-pay | Admitting: Family Medicine

## 2019-06-19 DIAGNOSIS — J4531 Mild persistent asthma with (acute) exacerbation: Secondary | ICD-10-CM

## 2019-06-19 NOTE — Telephone Encounter (Signed)
Attempted to contact patient - NA °

## 2019-06-19 NOTE — Telephone Encounter (Signed)
Dettinger. NTBS 1 inhaler given 06/07/19

## 2019-10-29 ENCOUNTER — Ambulatory Visit: Payer: BC Managed Care – PPO | Admitting: Family Medicine

## 2019-10-29 ENCOUNTER — Encounter: Payer: Self-pay | Admitting: Family Medicine

## 2019-10-29 ENCOUNTER — Other Ambulatory Visit: Payer: Self-pay

## 2019-10-29 VITALS — BP 116/82 | HR 80 | Temp 97.8°F | Ht 65.0 in | Wt 142.6 lb

## 2019-10-29 DIAGNOSIS — H6982 Other specified disorders of Eustachian tube, left ear: Secondary | ICD-10-CM | POA: Diagnosis not present

## 2019-10-29 NOTE — Patient Instructions (Signed)
Eustachian Tube Dysfunction ° °Eustachian tube dysfunction refers to a condition in which a blockage develops in the narrow passage that connects the middle ear to the back of the nose (eustachian tube). The eustachian tube regulates air pressure in the middle ear by letting air move between the ear and nose. It also helps to drain fluid from the middle ear space. °Eustachian tube dysfunction can affect one or both ears. When the eustachian tube does not function properly, air pressure, fluid, or both can build up in the middle ear. °What are the causes? °This condition occurs when the eustachian tube becomes blocked or cannot open normally. Common causes of this condition include: °· Ear infections. °· Colds and other infections that affect the nose, mouth, and throat (upper respiratory tract). °· Allergies. °· Irritation from cigarette smoke. °· Irritation from stomach acid coming up into the esophagus (gastroesophageal reflux). The esophagus is the tube that carries food from the mouth to the stomach. °· Sudden changes in air pressure, such as from descending in an airplane or scuba diving. °· Abnormal growths in the nose or throat, such as: °? Growths that line the nose (nasal polyps). °? Abnormal growth of cells (tumors). °? Enlarged tissue at the back of the throat (adenoids). °What increases the risk? °You are more likely to develop this condition if: °· You smoke. °· You are overweight. °· You are a child who has: °? Certain birth defects of the mouth, such as cleft palate. °? Large tonsils or adenoids. °What are the signs or symptoms? °Common symptoms of this condition include: °· A feeling of fullness in the ear. °· Ear pain. °· Clicking or popping noises in the ear. °· Ringing in the ear. °· Hearing loss. °· Loss of balance. °· Dizziness. °Symptoms may get worse when the air pressure around you changes, such as when you travel to an area of high elevation, fly on an airplane, or go scuba diving. °How is  this diagnosed? °This condition may be diagnosed based on: °· Your symptoms. °· A physical exam of your ears, nose, and throat. °· Tests, such as those that measure: °? The movement of your eardrum (tympanogram). °? Your hearing (audiometry). °How is this treated? °Treatment depends on the cause and severity of your condition. °· In mild cases, you may relieve your symptoms by moving air into your ears. This is called "popping the ears." °· In more severe cases, or if you have symptoms of fluid in your ears, treatment may include: °? Medicines to relieve congestion (decongestants). °? Medicines that treat allergies (antihistamines). °? Nasal sprays or ear drops that contain medicines that reduce swelling (steroids). °? A procedure to drain the fluid in your eardrum (myringotomy). In this procedure, a small tube is placed in the eardrum to: °§ Drain the fluid. °§ Restore the air in the middle ear space. °? A procedure to insert a balloon device through the nose to inflate the opening of the eustachian tube (balloon dilation). °Follow these instructions at home: °Lifestyle °· Do not do any of the following until your health care provider approves: °? Travel to high altitudes. °? Fly in airplanes. °? Work in a pressurized cabin or room. °? Scuba dive. °· Do not use any products that contain nicotine or tobacco, such as cigarettes and e-cigarettes. If you need help quitting, ask your health care provider. °· Keep your ears dry. Wear fitted earplugs during showering and bathing. Dry your ears completely after. °General instructions °· Take over-the-counter   and prescription medicines only as told by your health care provider. °· Use techniques to help pop your ears as recommended by your health care provider. These may include: °? Chewing gum. °? Yawning. °? Frequent, forceful swallowing. °? Closing your mouth, holding your nose closed, and gently blowing as if you are trying to blow air out of your nose. °· Keep all  follow-up visits as told by your health care provider. This is important. °Contact a health care provider if: °· Your symptoms do not go away after treatment. °· Your symptoms come back after treatment. °· You are unable to pop your ears. °· You have: °? A fever. °? Pain in your ear. °? Pain in your head or neck. °? Fluid draining from your ear. °· Your hearing suddenly changes. °· You become very dizzy. °· You lose your balance. °Summary °· Eustachian tube dysfunction refers to a condition in which a blockage develops in the eustachian tube. °· It can be caused by ear infections, allergies, inhaled irritants, or abnormal growths in the nose or throat. °· Symptoms include ear pain, hearing loss, or ringing in the ears. °· Mild cases are treated with maneuvers to unblock the ears, such as yawning or ear popping. °· Severe cases are treated with medicines. Surgery may also be done (rare). °This information is not intended to replace advice given to you by your health care provider. Make sure you discuss any questions you have with your health care provider. °Document Revised: 04/19/2017 Document Reviewed: 04/19/2017 °Elsevier Patient Education © 2020 [employer] ° °

## 2019-10-29 NOTE — Progress Notes (Signed)
Subjective: CC: left ear fullness PCP: Dettinger, Elige Radon, MD  JQB:[id number] Tonya Lee is a 34 y.o. female presenting to clinic today for:  1. Left ear fullness Simone reports fullness in her left ear that comes and goes for the last 6 weeks. She also reports muffled hearing and ringing in the ear that comes and goes. She denies fever, drainage, sore throat, increased nasal congestion, facial pain, headache, or cough. She has a history of sinus infections and ear infections but has not had issues in over a year. She takes Claritin daily and has been using Flonase daily for 2 weeks without improvement. She is concerned about an ear infection. She is currently [redacted] weeks pregnant with triplets so she is unsure of what medications she can take. She has not tried saline nasal sprays.   Relevant past medical, surgical, family, and social history reviewed and updated as indicated.  Allergies and medications reviewed and updated.  Allergies  Allergen Reactions  . Sulfa Antibiotics Anaphylaxis, Hives and Other (See Comments)    Throat and face swelled  . Acyclovir And Related Hives  . Ciprofloxacin   . Prednisone Nausea And Vomiting  . Nitrofuran Derivatives Nausea Only    Abdominal pain   Past Medical History:  Diagnosis Date  . 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 GERD (gastroesophageal reflux disease)   . Migraines   . Peptic ulcer disease     Current Outpatient Medications:  .  albuterol (VENTOLIN HFA) 108 (90 Base) MCG/ACT inhaler, Inhale 2 puffs into the lungs every 6 (six) hours as needed for wheezing or shortness of breath. (Needs to be seen before next refill), Disp: 6.7 g, Rfl: 0 .  aspirin 81 MG EC tablet, Take by mouth., Disp: , Rfl:  .  fluticasone (FLONASE) 50 MCG/ACT nasal spray, Place 2 sprays into both nostrils daily., Disp: 16 g, Rfl: 6 .  folic acid (FOLVITE) 1 MG tablet, Take 1 mg by mouth daily., Disp: , Rfl:  .  loratadine (CLARITIN) 10 MG tablet,  Take 1 tablet (10 mg total) by mouth daily., Disp: 30 tablet, Rfl: 11 .  metFORMIN (GLUCOPHAGE) 1000 MG tablet, Take 1,000 mg by mouth 2 (two) times daily., Disp: , Rfl:  .  Prenatal Vit-Fe Fumarate-FA (PRENATAL VITAMIN) 27-0.8 MG TABS, Prenatal Vitamin, Disp: , Rfl:  Social History   Socioeconomic History  . Marital status: Married    Spouse name: Not on file  . Number of children: Not on file  . Years of education: Not on file  . Highest education level: Not on file  Occupational History  . Not on file  Tobacco Use  . Smoking status: Never Smoker  . Smokeless tobacco: Never Used  Vaping Use  . Vaping Use: Never used  Substance and Sexual Activity  . Alcohol use: No  . Drug use: No  . Sexual activity: Yes  Other Topics Concern  . Not on file  Social History Narrative  . Not on file   Social Determinants of Health   Financial Resource Strain:   . Difficulty of Paying Living Expenses: Not on file  Food Insecurity:   . Worried About Programme researcher, broadcasting/film/video in the Last Year: Not on file  . Ran Out of Food in the Last Year: Not on file  Transportation Needs:   . Lack of Transportation (Medical): Not on file  . Lack of Transportation (Non-Medical): Not on file  Physical Activity:   . Days of Exercise  per Week: Not on file  . Minutes of Exercise per Session: Not on file  Stress:   . Feeling of Stress : Not on file  Social Connections:   . Frequency of Communication with Friends and Family: Not on file  . Frequency of Social Gatherings with Friends and Family: Not on file  . Attends Religious Services: Not on file  . Active Member of Clubs or Organizations: Not on file  . Attends Banker Meetings: Not on file  . Marital Status: Not on file  Intimate Partner Violence:   . Fear of Current or Ex-Partner: Not on file  . Emotionally Abused: Not on file  . Physically Abused: Not on file  . Sexually Abused: Not on file   Family History  Problem Relation Age of Onset   . Asthma Mother   . Hyperlipidemia Maternal Grandmother   . Hypertension Maternal Grandmother   . Asthma Maternal Grandmother   . Heart disease Maternal Grandfather   . Cancer Maternal Grandfather        thyroid  . Diabetes Maternal Grandfather   . Heart disease Paternal Grandfather     Review of Systems  Per HPI.   Objective: Office vital signs reviewed. BP 116/82   Pulse 80   Temp 97.8 F (36.6 C) (Temporal)   Ht 5\' 5"  (1.651 m)   Wt 142 lb 9.6 oz (64.7 kg)   LMP 07/14/2019   SpO2 98%   BMI 23.73 kg/m   Physical Examination:  Physical Exam Vitals and nursing note reviewed.  Constitutional:      Appearance: Normal appearance.  HENT:     Head: Normocephalic and atraumatic.     Jaw: No tenderness or pain on movement.     Right Ear: Ear canal and external ear normal. No drainage, swelling or tenderness. A middle ear effusion is present. There is no impacted cerumen. No mastoid tenderness. Tympanic membrane is not injected, perforated, erythematous, retracted or bulging.     Left Ear: Ear canal and external ear normal. No drainage, swelling or tenderness. A middle ear effusion is present. There is no impacted cerumen. No mastoid tenderness. Tympanic membrane is not injected, perforated, erythematous, retracted or bulging.  Eyes:     General:        Right eye: No discharge.        Left eye: No discharge.     Conjunctiva/sclera: Conjunctivae normal.  Skin:    General: Skin is warm and dry.  Neurological:     General: No focal deficit present.     Mental Status: She is alert.  Psychiatric:        Mood and Affect: Mood normal.        Behavior: Behavior normal.      Results for orders placed or performed in visit on 01/09/19  Novel Coronavirus, NAA ([facility])   Specimen: Nasopharyngeal(NP) swabs in vial transport medium   NASOPHARYNGE  TESTING  Result Value Ref Range   SARS-CoV-2, NAA Not Detected Not Detected     Assessment/ Plan: Vestal was seen today for  ear pain.  Diagnoses and all orders for this visit:  Dysfunction of left eustachian tube No signs of infection today. Continue Flonase and Claritin. Add saline spray. She is allergic to prednisone. Discussed decongestants but given that they should be used with caution in pregnancy, she declined today. Return to office for new or worsening symptoms, or if symptoms persist.    The above assessment and management plan was  discussed with the patient. The patient verbalized understanding of and has agreed to the management plan. Patient is aware to call the clinic if symptoms persist or worsen. Patient is aware when to return to the clinic for a follow-up visit. Patient educated on when it is appropriate to go to the emergency department.   Harlow Mares, FNP-C [facility] [address] [place], [place] [address] [phone]

## 2020-07-30 ENCOUNTER — Encounter: Payer: Self-pay | Admitting: Nurse Practitioner

## 2020-07-30 ENCOUNTER — Encounter: Payer: Self-pay | Admitting: Family Medicine

## 2020-07-30 ENCOUNTER — Ambulatory Visit: Payer: BC Managed Care – PPO | Admitting: Nurse Practitioner

## 2020-07-30 DIAGNOSIS — J029 Acute pharyngitis, unspecified: Secondary | ICD-10-CM | POA: Insufficient documentation

## 2020-07-30 DIAGNOSIS — J4531 Mild persistent asthma with (acute) exacerbation: Secondary | ICD-10-CM

## 2020-07-30 DIAGNOSIS — R059 Cough, unspecified: Secondary | ICD-10-CM | POA: Insufficient documentation

## 2020-07-30 DIAGNOSIS — Z20822 Contact with and (suspected) exposure to covid-19: Secondary | ICD-10-CM | POA: Diagnosis not present

## 2020-07-30 MED ORDER — AZITHROMYCIN 250 MG PO TABS: ORAL_TABLET | ORAL | 0 refills | Status: AC

## 2020-07-30 MED ORDER — NIRMATRELVIR/RITONAVIR (PAXLOVID)TABLET: 3.0000 | ORAL_TABLET | Freq: Two times a day (BID) | ORAL | 0 refills | Status: DC

## 2020-07-30 NOTE — Assessment & Plan Note (Signed)
Patient is waiting on COVID-19 PCR results. Patients children and souse all tested positive for COVID-19 24-36 hours ago.  Benzonate for cough, tylenol for headache. Increase hydration

## 2020-07-30 NOTE — Assessment & Plan Note (Signed)
Unresolved symptoms of pharyngitis after exposure to COVID-19.  Patient completed PCR, results pending.  Started patient on azithromycin 250 mg tablet by mouth.  Take 2 tablet 500 mg tablet by mouth day 1, 250 mg tablet day 2-5, increase hydration, Tylenol for headache and fever.  Education provided to patient for safety of breast-feeding and medication administration.  Patient has enough milk saved to last long enough to finish course of antibiotics, before going back to breast-feeding. Advised patient to follow-up with worsening unresolved symptoms.

## 2020-07-30 NOTE — Progress Notes (Addendum)
   Virtual Visit  Note Due to COVID-19 pandemic this visit was conducted virtually. This visit type was conducted due to national recommendations for restrictions regarding the COVID-19 Pandemic (e.g. social distancing, sheltering in place) in an effort to limit this patient's exposure and mitigate transmission in our community. All issues noted in this document were discussed and addressed.  A physical exam was not performed with this format.  I connected with Tonya Lee on 07/30/20 at 9:30 am by telephone and verified that I am speaking with the correct person using two identifiers. Tonya Lee is currently located at home during visit. The provider, Daryll Drown, NP is located in their office at time of visit.  I discussed the limitations, risks, security and privacy concerns of performing an evaluation and management service by telephone and the availability of in person appointments. I also discussed with the patient that there may be a patient responsible charge related to this service. The patient expressed understanding and agreed to proceed.   History and Present Illness:  Cough This is a new problem. The current episode started yesterday. The problem has been gradually worsening. The problem occurs constantly. The cough is Non-productive. Associated symptoms include headaches, nasal congestion, a sore throat and shortness of breath. Pertinent negatives include no chest pain, fever or rash. Exacerbated by: every one in the [facility] is positive for covid-19. She has tried nothing for the symptoms.     Review of Systems  Constitutional:  Negative for fever.  HENT:  Positive for sore throat.   Respiratory:  Positive for cough and shortness of breath.   Cardiovascular:  Negative for chest pain.  Skin:  Negative for rash.  Neurological:  Positive for headaches.    Observations/Objective: Tele-visit, patient not in distress  Assessment and Plan: Patient is waiting on COVID-19  PCR results. Patients children and souse all tested positive for COVID-19 24-36 hours ago.  Benzonate for cough, tylenol for headache. Increase hydration  Follow Up Instructions: Follow up with worsening symptoms and after Covid-PCR comes back for further evaluation    I discussed the assessment and treatment plan with the patient. The patient was provided an opportunity to ask questions and all were answered. The patient agreed with the plan and demonstrated an understanding of the instructions.   The patient was advised to call back or seek an in-person evaluation if the symptoms worsen or if the condition fails to improve as anticipated.  The above assessment and management plan was discussed with the patient. The patient verbalized understanding of and has agreed to the management plan. Patient is aware to call the clinic if symptoms persist or worsen. Patient is aware when to return to the clinic for a follow-up visit. Patient educated on when it is appropriate to go to the emergency department.   Time call ended:  9:40 am   I provided 10 minutes of  non face-to-face time during this encounter.    Daryll Drown, NP

## 2020-07-30 NOTE — Addendum Note (Signed)
Addended by: Daryll Drown on: 07/30/2020 04:11 PM   Modules accepted: Orders

## 2020-07-30 NOTE — Assessment & Plan Note (Signed)
Completed PCR COVID-19, results pending.

## 2020-08-01 MED ORDER — ALBUTEROL SULFATE HFA 108 (90 BASE) MCG/ACT IN AERS: 2.0000 | INHALATION_SPRAY | Freq: Four times a day (QID) | RESPIRATORY_TRACT | 0 refills | Status: DC | PRN

## 2020-08-01 MED ORDER — DEXAMETHASONE 2 MG PO TABS
ORAL_TABLET | ORAL | 0 refills | Status: DC
Start: 2020-08-01 — End: 2021-05-21

## 2021-02-27 ENCOUNTER — Other Ambulatory Visit: Payer: Self-pay

## 2021-02-27 ENCOUNTER — Ambulatory Visit: Payer: BC Managed Care – PPO | Admitting: Nurse Practitioner

## 2021-02-27 ENCOUNTER — Encounter: Payer: Self-pay | Admitting: Emergency Medicine

## 2021-02-27 ENCOUNTER — Ambulatory Visit
Admission: EM | Admit: 2021-02-27 | Discharge: 2021-02-27 | Disposition: A | Discharge status: Home / Self Care | Payer: BC Managed Care – PPO | Attending: Urgent Care | Admitting: Urgent Care

## 2021-02-27 DIAGNOSIS — R070 Pain in throat: Secondary | ICD-10-CM | POA: Diagnosis not present

## 2021-02-27 DIAGNOSIS — J3489 Other specified disorders of nose and nasal sinuses: Secondary | ICD-10-CM

## 2021-02-27 DIAGNOSIS — J018 Other acute sinusitis: Secondary | ICD-10-CM

## 2021-02-27 DIAGNOSIS — R052 Subacute cough: Secondary | ICD-10-CM | POA: Diagnosis not present

## 2021-02-27 DIAGNOSIS — J4530 Mild persistent asthma, uncomplicated: Secondary | ICD-10-CM

## 2021-02-27 DIAGNOSIS — R112 Nausea with vomiting, unspecified: Secondary | ICD-10-CM

## 2021-02-27 DIAGNOSIS — J3089 Other allergic rhinitis: Secondary | ICD-10-CM

## 2021-02-27 MED ORDER — ONDANSETRON 8 MG PO TBDP: 8.0000 mg | ORAL_TABLET | Freq: Three times a day (TID) | ORAL | 0 refills | Status: DC | PRN

## 2021-02-27 MED ORDER — PREDNISONE 20 MG PO TABS
ORAL_TABLET | ORAL | 0 refills | Status: DC
Start: 2021-02-27 — End: 2021-05-21

## 2021-02-27 MED ORDER — AMOXICILLIN-POT CLAVULANATE 875-125 MG PO TABS: 1.0000 | ORAL_TABLET | Freq: Two times a day (BID) | ORAL | 0 refills | Status: DC

## 2021-02-27 NOTE — ED Provider Notes (Signed)
[name]   MRN: [id number] DOB: 27-Mar-1985  Subjective:   Tonya Lee is a 36 y.o. female presenting for 2 week history of acute onset persistent throat pain, bilateral ear fullness and ear pain, sinus headaches, facial pain, coughing, chills.  She does get shortness of breath intermittently but thinks it is more from her sinuses as opposed to her lungs.  Has a history of allergic rhinitis and asthma.  Does not need refills on her inhaler.  No chest pain or wheezing.  No current facility-administered medications for this encounter.  Current Outpatient Medications:    albuterol (VENTOLIN HFA) 108 (90 Base) MCG/ACT inhaler, Inhale 2 puffs into the lungs every 6 (six) hours as needed for wheezing or shortness of breath. (Needs to be seen before next refill), Disp: 6.7 g, Rfl: 0   aspirin 81 MG EC tablet, Take by mouth., Disp: , Rfl:    dexamethasone (DECADRON) 2 MG tablet, Take 4 tablets for 3 days then 3 tablets for 3 days then 2 tablets for 3 days then 1 tablet for 3 days, Disp: 30 tablet, Rfl: 0   fluticasone (FLONASE) 50 MCG/ACT nasal spray, Place 2 sprays into both nostrils daily., Disp: 16 g, Rfl: 6   folic acid (FOLVITE) 1 MG tablet, Take 1 mg by mouth daily., Disp: , Rfl:    loratadine (CLARITIN) 10 MG tablet, Take 1 tablet (10 mg total) by mouth daily., Disp: 30 tablet, Rfl: 11   metFORMIN (GLUCOPHAGE) 1000 MG tablet, Take 1,000 mg by mouth 2 (two) times daily., Disp: , Rfl:    Prenatal Vit-Fe Fumarate-FA (PRENATAL VITAMIN) 27-0.8 MG TABS, Prenatal Vitamin, Disp: , Rfl:    Allergies  Allergen Reactions   Sulfa Antibiotics Anaphylaxis, Hives and Other (See Comments)    Throat and face swelled   Acyclovir And Related Hives   Ciprofloxacin    Prednisone Nausea And Vomiting   Nitrofuran Derivatives Nausea Only    Abdominal pain    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)    GERD (gastroesophageal reflux  disease)    Migraines    Peptic ulcer disease      Past Surgical History:  Procedure Laterality Date   ANKLE SURGERY Right july 2013   FASCIECTOMY Right    right lower leg   KNEE SURGERY      Family History  Problem Relation Age of Onset   Asthma Mother    Hyperlipidemia Maternal Grandmother    Hypertension Maternal Grandmother    Asthma Maternal Grandmother    Heart disease Maternal Grandfather    Cancer Maternal Grandfather        thyroid   Diabetes Maternal Grandfather    Heart disease Paternal Grandfather     Social History   Tobacco Use   Smoking status: Never   Smokeless tobacco: Never  Vaping Use   Vaping Use: Never used  Substance Use Topics   Alcohol use: No   Drug use: No    ROS   Objective:   Vitals: BP 116/81 (BP Location: Right Arm)    Pulse 74    Temp 98.3 F (36.8 C) (Oral)    Resp 18    Ht 5\' 5"  (1.651 m)    Wt 115 lb (52.2 kg)    SpO2 98%    Breastfeeding No    BMI 19.14 kg/m   Physical Exam Constitutional:      General: She is not in acute distress.  Appearance: Normal appearance. She is well-developed and normal weight. She is not ill-appearing, toxic-appearing or diaphoretic.  HENT:     Head: Normocephalic and atraumatic.     Right Ear: Tympanic membrane, ear canal and external ear normal. No drainage or tenderness. No middle ear effusion. There is no impacted cerumen. Tympanic membrane is not erythematous.     Left Ear: Tympanic membrane, ear canal and external ear normal. No drainage or tenderness.  No middle ear effusion. There is no impacted cerumen. Tympanic membrane is not erythematous.     Nose: Congestion and rhinorrhea present.     Mouth/Throat:     Mouth: Mucous membranes are moist. No oral lesions.     Pharynx: No pharyngeal swelling, oropharyngeal exudate, posterior oropharyngeal erythema or uvula swelling.     Tonsils: No tonsillar exudate or tonsillar abscesses.  Eyes:     General: No scleral icterus.       Right eye:  No discharge.        Left eye: No discharge.     Extraocular Movements: Extraocular movements intact.     Right eye: Normal extraocular motion.     Left eye: Normal extraocular motion.     Conjunctiva/sclera: Conjunctivae normal.  Cardiovascular:     Rate and Rhythm: Normal rate.     Heart sounds: No murmur heard.   No friction rub. No gallop.  Pulmonary:     Effort: Pulmonary effort is normal. No respiratory distress.     Breath sounds: No stridor. No wheezing, rhonchi or rales.  Chest:     Chest wall: No tenderness.  Musculoskeletal:     Cervical back: Normal range of motion and neck supple.  Lymphadenopathy:     Cervical: No cervical adenopathy.  Skin:    General: Skin is warm and dry.  Neurological:     General: No focal deficit present.     Mental Status: She is alert and oriented to person, place, and time.  Psychiatric:        Mood and Affect: Mood normal.        Behavior: Behavior normal.    Assessment and Plan :   PDMP not reviewed this encounter.  1. Acute non-recurrent sinusitis of other sinus   2. Sinus pain   3. Throat pain   4. Subacute cough   5. Allergic rhinitis due to other allergic trigger, unspecified seasonality   6. Mild persistent asthma without complication   7. Nausea and vomiting, unspecified vomiting type    Will start empiric treatment for sinusitis with Augmentin.  Recommended supportive care otherwise including the use of oral antihistamine.  In the context of her asthma, having a persistent cough and shortness of breath offered oral prednisone which will help with her allergic rhinitis as well.  Patient was agreeable.  Given timeline of illness deferred respiratory testing.  Deferred imaging given clear cardiopulmonary exam, hemodynamically stable vital signs. Counseled patient on potential for adverse effects with medications prescribed/recommended today, ER and return-to-clinic precautions discussed, patient verbalized understanding.     Jaynee Eagles, [place] 02/27/21 [id number]

## 2021-02-27 NOTE — ED Triage Notes (Signed)
Pt reports sore throat, headache, cough, chills intermittently x2 weeks.

## 2021-04-16 ENCOUNTER — Emergency Department: Admit: 2021-04-16 | Discharge status: Absent without leave | Payer: Self-pay

## 2021-05-21 ENCOUNTER — Encounter: Payer: Self-pay | Admitting: Family

## 2021-05-21 ENCOUNTER — Ambulatory Visit: Payer: BC Managed Care – PPO | Admitting: Family

## 2021-05-21 ENCOUNTER — Other Ambulatory Visit: Payer: BC Managed Care – PPO

## 2021-05-21 ENCOUNTER — Other Ambulatory Visit: Payer: Self-pay

## 2021-05-21 DIAGNOSIS — S80862A Insect bite (nonvenomous), left lower leg, initial encounter: Secondary | ICD-10-CM | POA: Diagnosis not present

## 2021-05-21 DIAGNOSIS — S80869A Insect bite (nonvenomous), unspecified lower leg, initial encounter: Secondary | ICD-10-CM

## 2021-05-21 DIAGNOSIS — S80861A Insect bite (nonvenomous), right lower leg, initial encounter: Secondary | ICD-10-CM

## 2021-05-21 DIAGNOSIS — W57XXXA Bitten or stung by nonvenomous insect and other nonvenomous arthropods, initial encounter: Secondary | ICD-10-CM

## 2021-05-21 MED ORDER — CETIRIZINE HCL 10 MG PO TABS: 10.0000 mg | ORAL_TABLET | Freq: Every day | ORAL | 11 refills | Status: DC

## 2021-05-21 MED ORDER — DOXYCYCLINE HYCLATE 100 MG PO TABS: 100.0000 mg | ORAL_TABLET | Freq: Two times a day (BID) | ORAL | 0 refills | Status: DC

## 2021-05-21 NOTE — Patient Instructions (Signed)
[place] Spotted Fever [place] spotted fever is an infection caused by bacteria that spreads to people through the bite of certain ticks. The illness causes flu-like symptoms and a reddish-purple rash. The illness does not spread from person to person (is not contagious). When this condition is not treated right away, it can quickly become very serious. It can sometimes lead to long-term (chronic) health problems and can be life-threatening. What are the causes? This condition is caused by a type of bacteria (Rickettsia rickettsii) that is carried by American dog ticks, brown dog ticks, and [place] wood ticks. The infection spreads through: A bite from an infected tick. Tick bites are usually painless, and they frequently are not noticed. Infected tick blood, body fluids, or feces that get into the body through damaged skin, such as a small cut or sore. This could happen while removing a tick from a pet or from another person. What increases the risk? The following factors may make you more likely to develop this condition: Spending a lot of time outdoors, especially in rural areas or areas with long grass or woods with high brush. Spending time outdoors during warm weather. Ticks are most active during warm weather. What are the signs or symptoms? Symptoms of this condition include: Fever. Headache. Eye redness or sensitivity to light. There may also be swelling around the eyes. A reddish-purple rash. This usually appears 2-4 days after the first symptoms begin. The rash often starts on the wrists, forearms, and ankles. It may then spread to the palms, the bottom of the feet, legs, and trunk. Muscle aches. Nausea or vomiting. Poor appetite. Abdominal pain. Symptoms may develop 2-14 days after a tick bite. How is this diagnosed? This condition is diagnosed based on: Your medical history. A physical exam. Blood tests. Whether you have recently been bitten by a tick or  spent time in areas where: Ticks are common. [place] spotted fever is common. You may also have a skin biopsy if you have a rash. A skin biopsy is a procedure to remove a sample of your skin so that it can be checked under a microscope. How is this treated? This condition is treated with antibiotic medicines. It is important to begin treatment right away. In some cases, your health care provider may begin treatment before the diagnosis is confirmed. If your symptoms are severe, you may need to be treated in the hospital where you can get antibiotics and be monitored during treatment. Follow these instructions at home: Take over-the-counter and prescription medicines only as told by your health care provider. Take your antibiotic medicine as told by your health care provider. Do not stop taking the antibiotic even if you start to feel better. Rest as much as possible until you feel better. Return to your normal activities as told by your health care provider. Ask your health care provider what activities are safe for you. Drink enough fluid to keep your urine pale yellow. Keep all follow-up visits. This is important. Contact a health care provider if: You have a rash that gets more red or swollen. You have fluid draining from any areas of your rash. You develop a fever after being bitten by a tick. You develop a rash 2-4 days after experiencing flu-like symptoms. You bruise easily. You have bleeding from your gums. You have numbness or tingling in your arms or legs. Get help right away if: You have chest pain. You have shortness of breath. You have a severe headache.   You have vision or hearing problems. You feel confused. You have a seizure. You have severe abdominal pain. You have blood in your stool (feces). You are not able to control when you urinate or have bowel movements (incontinence). These symptoms may represent a serious problem that is an emergency. Do not wait to see  if the symptoms will go away. Get medical help right away. Call your local emergency services (911 in the [place] Do not drive yourself to the hospital. Summary [place] spotted fever is a bacterial infection that spreads to people through contact with certain ticks. When this condition is not treated right away, it can quickly become very serious. Sometimes, it can lead to long-term (chronic) health problems and can be life-threatening. You are more likely to develop this infection if you spend time outdoors in warm weather and in areas with tall grass or brush. Symptoms of this condition include fever, headache, nausea, vomiting, abdominal pain, muscle aches, and a reddish-purple rash that usually appears 2-4 days after a fever. This condition is treated with antibiotic medicines. This information is not intended to replace advice given to you by your health care provider. Make sure you discuss any questions you have with your health care provider. Document Revised: 12/08/2019 Document Reviewed: 12/08/2019 Elsevier Patient Education  2023 [employer]  

## 2021-05-21 NOTE — Progress Notes (Signed)
? ?Virtual Visit  Note ?Due to COVID-19 pandemic this visit was conducted virtually. This visit type was conducted due to national recommendations for restrictions regarding the COVID-19 Pandemic (e.g. social distancing, sheltering in place) in an effort to limit this patient's exposure and mitigate transmission in our community. All issues noted in this document were discussed and addressed.  A physical exam was not performed with this format. ? ?I connected with Tonya Lee Right on 05/21/21 at 11:15 AM  by telephone and verified that I am speaking with the correct person using two identifiers. Tonya Lee is currently located at work and no one is currently with her during visit. The provider, Jannifer Rodney, FNP is located in their office at time of visit. ? ?I discussed the limitations, risks, security and privacy concerns of performing an evaluation and management service by telephone and the availability of in person appointments. I also discussed with the patient that there may be a patient responsible charge related to this service. The patient expressed understanding and agreed to proceed. ? ? ?[name],you are scheduled for a virtual visit with your provider today.   ? ?Just as we do with appointments in the office, we must obtain your consent to participate.  Your consent will be active for this visit and any virtual visit you may have with one of our providers in the next 365 days.   ? ?If you have a MyChart account, I can also send a copy of this consent to you electronically.  All virtual visits are billed to your insurance company just like a traditional visit in the office.  As this is a virtual visit, video technology does not allow for your provider to perform a traditional examination.  This may limit your provider's ability to fully assess your condition.  If your provider identifies any concerns that need to be evaluated in person or the need to arrange testing such as labs, EKG, etc, we will  make arrangements to do so.   ? ?Although advances in technology are sophisticated, we cannot ensure that it will always work on either your end or our end.  If the connection with a video visit is poor, we may have to switch to a telephone visit.  With either a video or telephone visit, we are not always able to ensure that we have a secure connection.   I need to obtain your verbal consent now.   Are you willing to proceed with your visit today?  ? ?Tonya Lee has provided verbal consent on 05/21/2021 for a virtual visit (video or telephone). ? ? ?Jannifer Rodney, FNP ?05/21/2021  11:16 AM ? ? ?History and Present Illness: ? ?HPI ?PT calls the office today with several tick bites on Monday. States she got in a next and removed 5 ticks on her legs. She was fine Tuesday, but yesterday she had body aches, chills, joint pain, itching, headache, sweating, and fatigue. She reports she feels better, but still having body aches and headaches. No appetite. Denies any rash.   ? ? ? ?Review of Systems  ?Constitutional:  Positive for chills and malaise/fatigue. Negative for fever.  ?Musculoskeletal:  Positive for joint pain and myalgias.  ?All other systems reviewed and are negative. ? ? ?Observations/Objective: ?No SOB or distress  ? ?Assessment and Plan: ?1. Tick bite of lower leg, unspecified laterality, initial encounter ?-Pt to report any new fever, joint pain, or rash ?-Wear protective clothing while outside- Long sleeves and long pants ?-Put insect repellent  on all exposed skin and along clothing ?-Take a shower as soon as possible after being outside ?-Given current symptoms, will treat with doxycycline 21 days ?Will get labs drawn next week ?Follow up if symptoms worsen or do not improve   ?- doxycycline (VIBRA-TABS) 100 MG tablet; Take 1 tablet (100 mg total) by mouth 2 (two) times daily.  Dispense: 42 tablet; Refill: 0 ?- Alpha-Gal Panel; Future ?- Lyme Disease Serology w/Reflex; Future ?- [place] spotted fvr abs  pnl(IgG+IgM); Future ? ? ?  ?I discussed the assessment and treatment plan with the patient. The patient was provided an opportunity to ask questions and all were answered. The patient agreed with the plan and demonstrated an understanding of the instructions. ?  ?The patient was advised to call back or seek an in-person evaluation if the symptoms worsen or if the condition fails to improve as anticipated. ? ?The above assessment and management plan was discussed with the patient. The patient verbalized understanding of and has agreed to the management plan. Patient is aware to call the clinic if symptoms persist or worsen. Patient is aware when to return to the clinic for a follow-up visit. Patient educated on when it is appropriate to go to the emergency department.  ? ?Time call ended:  11:26 AM  ? ?I provided  11 minutes of  non face-to-face time during this encounter. ? ? ? ?Jannifer Rodney, FNP ? ? ?

## 2021-05-22 ENCOUNTER — Ambulatory Visit (INDEPENDENT_AMBULATORY_CARE_PROVIDER_SITE_OTHER): Payer: BC Managed Care – PPO | Admitting: Family Medicine

## 2021-05-22 ENCOUNTER — Encounter: Payer: Self-pay | Admitting: Family Medicine

## 2021-05-22 VITALS — BP 102/73 | HR 72 | Temp 98.1°F | Resp 20 | Ht 65.0 in | Wt 120.0 lb

## 2021-05-22 DIAGNOSIS — J029 Acute pharyngitis, unspecified: Secondary | ICD-10-CM

## 2021-05-22 DIAGNOSIS — A770 Spotted fever due to Rickettsia rickettsii: Secondary | ICD-10-CM

## 2021-05-22 DIAGNOSIS — R110 Nausea: Secondary | ICD-10-CM

## 2021-05-22 DIAGNOSIS — R52 Pain, unspecified: Secondary | ICD-10-CM

## 2021-05-22 LAB — LYME DISEASE SEROLOGY W/REFLEX: Lyme Total Antibody EIA: NEGATIVE

## 2021-05-22 LAB — RAPID STREP SCREEN (MED CTR MEBANE ONLY): Strep Gp A Ag, IA W/Reflex: NEGATIVE

## 2021-05-22 LAB — CULTURE, GROUP A STREP

## 2021-05-22 LAB — VERITOR FLU A/B WAIVED
Influenza A: NEGATIVE
Influenza B: NEGATIVE

## 2021-05-22 MED ORDER — ONDANSETRON HCL 4 MG PO TABS: 4.0000 mg | ORAL_TABLET | Freq: Three times a day (TID) | ORAL | 0 refills | Status: DC | PRN

## 2021-05-22 NOTE — Progress Notes (Signed)
? ?BP 102/73   Pulse 72   Temp 98.1 ?F (36.7 ?C) (Temporal)   Resp 20   Ht 5\' 5"  (1.651 m)   Wt 120 lb (54.4 kg)   SpO2 100%   BMI 19.97 kg/m?   ? ?Subjective:  ? ?Patient ID: Tonya Lee, female    DOB: June 29, 1985, 36 y.o.   MRN: [id number] ?HPI: ?Tonya Lee is a 36 y.o. female presenting on 05/22/2021 for Generalized Body Aches (Had telephone visit with [facility] yesterday and treated for RMSF but wants to be checked for flu), sweating at night time, and Sore Throat ? ? ?HPI ?Body aches ?Patient is coming in today because she is continued to have body aches and sore throat and generally not feeling well.  She started about 2 or 3 days ago with the symptoms.  She has had multiple tick bites over the past week.  She said she feels like she had a brick wall on the other day when this started 2 days ago.  She started having more of a sore throat and swallowing trouble last night although she does feel little bit better today but she was just concerned that she might be having something else.  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 ? ?Relevant past medical, surgical, family and social history reviewed and updated as indicated. Interim medical history since our last visit reviewed. ?Allergies and medications reviewed and updated. ? ?Review of Systems  ?Constitutional:  Positive for chills and fever.  ?HENT:  Positive for congestion and sore throat. Negative for sinus pressure and trouble swallowing.   ?Eyes:  Negative for visual disturbance.  ?Respiratory:  Negative for cough, chest tightness and shortness of breath.   ?Cardiovascular:  Negative for chest pain and leg swelling.  ?Musculoskeletal:  Positive for arthralgias and myalgias. Negative for back pain and gait problem.  ?Skin:  Negative for rash.  ?Neurological:  Negative for light-headedness and headaches.  ?Psychiatric/Behavioral:  Negative for agitation and behavioral problems.    ?All other systems reviewed and are negative. ? ?Per HPI unless specifically indicated above ? ? ?Allergies as of 05/22/2021   ? ?   Reactions  ? Sulfa Antibiotics Anaphylaxis, Hives, Other (See Comments)  ? Throat and face swelled  ? Acyclovir And Related Hives  ? Ciprofloxacin   ? Prednisone Nausea And Vomiting  ? Nitrofuran Derivatives Nausea Only  ? Abdominal pain  ? ?  ? ?  ?Medication List  ?  ? ?  ? Accurate as of May 22, 2021  8:55 AM. If you have any questions, ask your nurse or doctor.  ?  ?  ? ?  ? ?albuterol 108 (90 Base) MCG/ACT inhaler ?Commonly known as: VENTOLIN HFA ?Inhale 2 puffs into the lungs every 6 (six) hours as needed for wheezing or shortness of breath. (Needs to be seen before next refill) ?  ?cetirizine 10 MG tablet ?Commonly known as: ZYRTEC ?Take 1 tablet (10 mg total) by mouth daily. ?  ?doxycycline 100 MG tablet ?Commonly known as: VIBRA-TABS ?Take 1 tablet (100 mg total) by mouth 2 (two) times daily. ?  ?ondansetron 4 MG tablet ?Commonly known as: Zofran ?Take 1 tablet (4 mg total) by mouth every 8 (eight) hours as needed for nausea or vomiting. ?Started by: May 24, 2021, MD ?  ? ?  ? ? ? ?Objective:  ? ?BP 102/73   Pulse 72  Temp 98.1 ?F (36.7 ?C) (Temporal)   Resp 20   Ht 5\' 5"  (1.651 m)   Wt 120 lb (54.4 kg)   SpO2 100%   BMI 19.97 kg/m?   ?Wt Readings from Last 3 Encounters:  ?05/22/21 120 lb (54.4 kg)  ?02/27/21 115 lb (52.2 kg)  ?10/29/19 142 lb 9.6 oz (64.7 kg)  ?  ?Physical Exam ?Vitals and nursing note reviewed.  ?Constitutional:   ?   General: She is not in acute distress. ?   Appearance: She is well-developed. She is not diaphoretic.  ?Eyes:  ?   Conjunctiva/sclera: Conjunctivae normal.  ?Cardiovascular:  ?   Rate and Rhythm: Normal rate and regular rhythm.  ?   Heart sounds: Normal heart sounds. No murmur heard. ?Pulmonary:  ?   Effort: Pulmonary effort is normal. No respiratory distress.  ?   Breath sounds: Normal breath sounds. No wheezing.   ?Musculoskeletal:     ?   General: No swelling or tenderness. Normal range of motion.  ?Skin: ?   General: Skin is warm and dry.  ?   Findings: No rash.  ?Neurological:  ?   Mental Status: She is alert and oriented to person, place, and time.  ?   Coordination: Coordination normal.  ?Psychiatric:     ?   Behavior: Behavior normal.  ? ? ?Strep negative ? ?Rapid flu: Negative ? ?Assessment & Plan:  ? ?Problem List Items Addressed This Visit   ?None ?Visit Diagnoses   ? ? Sore throat    -  Primary  ? Relevant Orders  ? Rapid Strep Screen ([facility] ONLY)  ? Body aches      ? Relevant Orders  ? Veritor Flu A/B Waived  ? Nausea      ? Relevant Medications  ? ondansetron (ZOFRAN) 4 MG tablet  ? [facility] spotted fever      ? ?  ?  ?Based on her tick bites and her symptoms, it does fit like possible [facility] spotted fever and she is already on her doxycycline and recommend to continue that and let [facility] know if it is not improving over the next 2 or 3 days ? ?Gave red flag signs if anything worsens or changes. ?Follow up plan: ?Return if symptoms worsen or fail to improve. ? ?Counseling provided for all of the vaccine components ?Orders Placed This Encounter  ?Procedures  ? Rapid Strep Screen ([facility] ONLY)  ? Veritor Flu A/B Waived  ? ? ?[place], MD ?[facility] ?05/22/2021, 8:55 AM ? ? ? ? ?

## 2021-05-22 NOTE — Patient Instructions (Signed)
[place] Spotted Fever [place] spotted fever is an infection caused by bacteria that spreads to people through the bite of certain ticks. The illness causes flu-like symptoms and a reddish-purple rash. The illness does not spread from person to person (is not contagious). When this condition is not treated right away, it can quickly become very serious. It can sometimes lead to long-term (chronic) health problems and can be life-threatening. What are the causes? This condition is caused by a type of bacteria (Rickettsia rickettsii) that is carried by American dog ticks, brown dog ticks, and [place] wood ticks. The infection spreads through: A bite from an infected tick. Tick bites are usually painless, and they frequently are not noticed. Infected tick blood, body fluids, or feces that get into the body through damaged skin, such as a small cut or sore. This could happen while removing a tick from a pet or from another person. What increases the risk? The following factors may make you more likely to develop this condition: Spending a lot of time outdoors, especially in rural areas or areas with long grass or woods with high brush. Spending time outdoors during warm weather. Ticks are most active during warm weather. What are the signs or symptoms? Symptoms of this condition include: Fever. Headache. Eye redness or sensitivity to light. There may also be swelling around the eyes. A reddish-purple rash. This usually appears 2-4 days after the first symptoms begin. The rash often starts on the wrists, forearms, and ankles. It may then spread to the palms, the bottom of the feet, legs, and trunk. Muscle aches. Nausea or vomiting. Poor appetite. Abdominal pain. Symptoms may develop 2-14 days after a tick bite. How is this diagnosed? This condition is diagnosed based on: Your medical history. A physical exam. Blood tests. Whether you have recently been bitten by a tick or  spent time in areas where: Ticks are common. [place] spotted fever is common. You may also have a skin biopsy if you have a rash. A skin biopsy is a procedure to remove a sample of your skin so that it can be checked under a microscope. How is this treated? This condition is treated with antibiotic medicines. It is important to begin treatment right away. In some cases, your health care provider may begin treatment before the diagnosis is confirmed. If your symptoms are severe, you may need to be treated in the hospital where you can get antibiotics and be monitored during treatment. Follow these instructions at home: Take over-the-counter and prescription medicines only as told by your health care provider. Take your antibiotic medicine as told by your health care provider. Do not stop taking the antibiotic even if you start to feel better. Rest as much as possible until you feel better. Return to your normal activities as told by your health care provider. Ask your health care provider what activities are safe for you. Drink enough fluid to keep your urine pale yellow. Keep all follow-up visits. This is important. Contact a health care provider if: You have a rash that gets more red or swollen. You have fluid draining from any areas of your rash. You develop a fever after being bitten by a tick. You develop a rash 2-4 days after experiencing flu-like symptoms. You bruise easily. You have bleeding from your gums. You have numbness or tingling in your arms or legs. Get help right away if: You have chest pain. You have shortness of breath. You have a severe headache.   You have vision or hearing problems. You feel confused. You have a seizure. You have severe abdominal pain. You have blood in your stool (feces). You are not able to control when you urinate or have bowel movements (incontinence). These symptoms may represent a serious problem that is an emergency. Do not wait to see  if the symptoms will go away. Get medical help right away. Call your local emergency services (911 in the [place] Do not drive yourself to the hospital. Summary [place] spotted fever is a bacterial infection that spreads to people through contact with certain ticks. When this condition is not treated right away, it can quickly become very serious. Sometimes, it can lead to long-term (chronic) health problems and can be life-threatening. You are more likely to develop this infection if you spend time outdoors in warm weather and in areas with tall grass or brush. Symptoms of this condition include fever, headache, nausea, vomiting, abdominal pain, muscle aches, and a reddish-purple rash that usually appears 2-4 days after a fever. This condition is treated with antibiotic medicines. This information is not intended to replace advice given to you by your health care provider. Make sure you discuss any questions you have with your health care provider. Document Revised: 12/08/2019 Document Reviewed: 12/08/2019 Elsevier Patient Education  2023 [employer]  

## 2021-05-23 ENCOUNTER — Encounter: Payer: Self-pay | Admitting: Family Medicine

## 2021-05-24 LAB — ALPHA-GAL PANEL
Allergen Lamb IgE: <0.1 kU/L
Beef IgE: <0.1 kU/L
IgE (Immunoglobulin E), Serum: 17 IU/mL (ref 6–495)
O215-IgE Alpha-Gal: <0.1 kU/L
Pork IgE: <0.1 kU/L

## 2021-05-26 LAB — ROCKY MTN SPOTTED FVR ABS PNL(IGG+IGM)
RMSF IgG: NEGATIVE
RMSF IgM: 0.54 index (ref 0.00–0.89)

## 2021-08-06 ENCOUNTER — Encounter: Payer: Self-pay | Admitting: Family Medicine

## 2021-08-06 ENCOUNTER — Ambulatory Visit (INDEPENDENT_AMBULATORY_CARE_PROVIDER_SITE_OTHER): Payer: BC Managed Care – PPO | Admitting: Family Medicine

## 2021-08-06 ENCOUNTER — Other Ambulatory Visit: Payer: BC Managed Care – PPO

## 2021-08-06 DIAGNOSIS — W57XXXA Bitten or stung by nonvenomous insect and other nonvenomous arthropods, initial encounter: Secondary | ICD-10-CM | POA: Diagnosis not present

## 2021-08-06 DIAGNOSIS — L282 Other prurigo: Secondary | ICD-10-CM

## 2021-08-06 DIAGNOSIS — S70261A Insect bite (nonvenomous), right hip, initial encounter: Secondary | ICD-10-CM

## 2021-08-06 MED ORDER — PREDNISONE 20 MG PO TABS: ORAL_TABLET | ORAL | 0 refills | Status: DC

## 2021-08-06 MED ORDER — FAMOTIDINE 20 MG PO TABS: 20.0000 mg | ORAL_TABLET | Freq: Two times a day (BID) | ORAL | 0 refills | Status: DC

## 2021-08-06 NOTE — Progress Notes (Signed)
Virtual Visit via telephone Note Due to COVID-19 pandemic this visit was conducted virtually. This visit type was conducted due to national recommendations for restrictions regarding the COVID-19 Pandemic (e.g. social distancing, sheltering in place) in an effort to limit this patient's exposure and mitigate transmission in our community. All issues noted in this document were discussed and addressed.  A physical exam was not performed with this format.   I connected with Tonya Lee on 08/06/2021 at 1550 by telephone and verified that I am speaking with the correct person using two identifiers. Tonya Lee is currently located at home and patient is currently with them during visit. The provider, Kari Baars, FNP is located in their office at time of visit.  I discussed the limitations, risks, security and privacy concerns of performing an evaluation and management service by virtual visit and the availability of in person appointments. I also discussed with the patient that there may be a patient responsible charge related to this service. The patient expressed understanding and agreed to proceed.  Subjective:  Patient ID: Tonya Lee, female    DOB: 1985/10/07, 36 y.o.   MRN: [id number]  Chief Complaint:  Rash   HPI: Tonya Lee is a 36 y.o. female presenting on 08/06/2021 for Rash   Pt states she has had a recurrent rash, welt like and pruritic over the last several weeks. She states it reoccurred over the last few days. She reports removing a tick from her leg recently. She is concerned about Alpha-Gal intolerance as it does occur after eating red meats / dairy. She was tested for this in May but has had other tick bites since this time. No angioedema or shortness of breath.   Rash This is a recurrent problem. The current episode started 1 to 4 weeks ago. The problem has been waxing and waning since onset. The affected locations include the left arm, chest, Lee arm, abdomen  and back. The rash is characterized by redness and itchiness (welts). Pertinent negatives include no anorexia, congestion, cough, diarrhea, eye pain, facial edema, fatigue, fever, joint pain, nail changes, rhinorrhea, shortness of breath, sore throat or vomiting. Past treatments include anti-itch cream and antihistamine. The treatment provided no relief.     Relevant past medical, surgical, family, and social history reviewed and updated as indicated.  Allergies and medications reviewed and updated.   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)    GERD (gastroesophageal reflux disease)    Migraines    Peptic ulcer disease     Past Surgical History:  Procedure Laterality Date   ANKLE SURGERY Lee july 2013   FASCIECTOMY Lee    Lee lower leg   KNEE SURGERY      Social History   Socioeconomic History   Marital status: Married    Spouse name: Not on file   Number of children: Not on file   Years of education: Not on file   Highest education level: Not on file  Occupational History   Not on file  Tobacco Use   Smoking status: Never   Smokeless tobacco: Never  Vaping Use   Vaping Use: Never used  Substance and Sexual Activity   Alcohol use: No   Drug use: No   Sexual activity: Yes  Other Topics Concern   Not on file  Social History Narrative   Not on file   Social Determinants of Health   Financial Resource Strain: Not on file  Food Insecurity: Not on file  Transportation Needs: Not on file  Physical Activity: Not on file  Stress: Not on file  Social Connections: Not on file  Intimate Partner Violence: Not on file    Outpatient Encounter Medications as of 08/06/2021  Medication Sig   famotidine (PEPCID) 20 MG tablet Take 1 tablet (20 mg total) by mouth 2 (two) times daily for 14 days.   predniSONE (DELTASONE) 20 MG tablet 2 po at sametime daily for 5 days- start tomorrow   albuterol (VENTOLIN HFA) 108 (90  Base) MCG/ACT inhaler Inhale 2 puffs into the lungs every 6 (six) hours as needed for wheezing or shortness of breath. (Needs to be seen before next refill)   cetirizine (ZYRTEC) 10 MG tablet Take 1 tablet (10 mg total) by mouth daily.   doxycycline (VIBRA-TABS) 100 MG tablet Take 1 tablet (100 mg total) by mouth 2 (two) times daily.   ondansetron (ZOFRAN) 4 MG tablet Take 1 tablet (4 mg total) by mouth every 8 (eight) hours as needed for nausea or vomiting.   No facility-administered encounter medications on file as of 08/06/2021.    Allergies  Allergen Reactions   Sulfa Antibiotics Anaphylaxis, Hives and Other (See Comments)    Throat and face swelled   Acyclovir And Related Hives   Ciprofloxacin    Nitrofuran Derivatives Nausea Only    Abdominal pain    Review of Systems  Constitutional:  Negative for activity change, appetite change, chills, diaphoresis, fatigue, fever and unexpected weight change.  HENT:  Negative for congestion, rhinorrhea and sore throat.   Eyes:  Negative for photophobia, pain and visual disturbance.  Respiratory:  Negative for apnea, cough, choking, chest tightness, shortness of breath, wheezing and stridor.   Cardiovascular:  Negative for chest pain and leg swelling.  Gastrointestinal:  Negative for abdominal pain, anorexia, constipation, diarrhea, nausea and vomiting.  Genitourinary:  Negative for decreased urine volume and difficulty urinating.  Musculoskeletal:  Negative for arthralgias, joint pain, joint swelling and myalgias.  Skin:  Positive for color change and rash. Negative for nail changes, pallor and wound.  Neurological:  Negative for dizziness, weakness, light-headedness and headaches.  All other systems reviewed and are negative.        Observations/Objective: No vital signs or physical exam, this was a virtual health encounter.  Pt alert and oriented, answers all questions appropriately, and able to speak in full sentences.     Assessment and Plan: Tonya Lee was seen today for rash.  Diagnoses and all orders for this visit:  Tick bite of Lee hip, initial encounter Reported symptoms with recent removal of an embedded tick concerning for alpha-gal intolerance. No indications of anaphylaxis. Will repeat labs. Pepcid and prednisone as prescribed. If symptoms persist and labs are negative, will refer to allergist.  -     Alpha-Gal Panel; Future -     predniSONE (DELTASONE) 20 MG tablet; 2 po at sametime daily for 5 days- start tomorrow -     famotidine (PEPCID) 20 MG tablet; Take 1 tablet (20 mg total) by mouth 2 (two) times daily for 14 days.  Pruritic rash Reported symptoms with recent removal of an embedded tick concerning for alpha-gal intolerance. Could also be from gluten intolerance. No indications of anaphylaxis. Will repeat labs. Pepcid and prednisone as prescribed. If symptoms persist and labs are negative, will refer to allergist. Report new, worsening, or persistent symptoms.  -     Alpha-Gal Panel; Future -     Celiac  Disease Comprehensive Panel with Reflexes; Future -     predniSONE (DELTASONE) 20 MG tablet; 2 po at sametime daily for 5 days- start tomorrow -     famotidine (PEPCID) 20 MG tablet; Take 1 tablet (20 mg total) by mouth 2 (two) times daily for 14 days.     Follow Up Instructions: Return if symptoms worsen or fail to improve.    I discussed the assessment and treatment plan with the patient. The patient was provided an opportunity to ask questions and all were answered. The patient agreed with the plan and demonstrated an understanding of the instructions.   The patient was advised to call back or seek an in-person evaluation if the symptoms worsen or if the condition fails to improve as anticipated.  The above assessment and management plan was discussed with the patient. The patient verbalized understanding of and has agreed to the management plan. Patient is aware to call the  clinic if they develop any new symptoms or if symptoms persist or worsen. Patient is aware when to return to the clinic for a follow-up visit. Patient educated on when it is appropriate to go to the emergency department.    I provided 13 minutes of time during this telephone encounter.   Monia Pouch, FNP-C [facility] [address] [place], [place] [address] [phone] 08/06/2021

## 2021-08-08 LAB — ALPHA-GAL PANEL
Allergen Lamb IgE: <0.1 kU/L
Beef IgE: <0.1 kU/L
IgE (Immunoglobulin E), Serum: 20 IU/mL (ref 6–495)
O215-IgE Alpha-Gal: <0.1 kU/L
Pork IgE: <0.1 kU/L

## 2021-08-08 LAB — CELIAC DISEASE COMPREHENSIVE PANEL WITH REFLEXES
IgA/Immunoglobulin A, Serum: 173 mg/dL (ref 87–352)
Transglutaminase IgA: <2 U/mL (ref 0–3)

## 2021-08-12 ENCOUNTER — Encounter: Payer: Self-pay | Admitting: Family Medicine

## 2021-08-13 ENCOUNTER — Telehealth: Payer: Self-pay | Admitting: *Deleted

## 2021-08-13 ENCOUNTER — Other Ambulatory Visit: Payer: Self-pay | Admitting: Family Medicine

## 2021-08-13 ENCOUNTER — Ambulatory Visit: Payer: BC Managed Care – PPO | Admitting: Allergy

## 2021-08-13 ENCOUNTER — Encounter: Payer: Self-pay | Admitting: Allergy

## 2021-08-13 VITALS — BP 98/62 | HR 71 | Temp 98.7°F | Resp 18 | Ht 65.0 in | Wt 124.0 lb

## 2021-08-13 DIAGNOSIS — L282 Other prurigo: Secondary | ICD-10-CM | POA: Diagnosis not present

## 2021-08-13 DIAGNOSIS — J3089 Other allergic rhinitis: Secondary | ICD-10-CM | POA: Diagnosis not present

## 2021-08-13 DIAGNOSIS — Z8709 Personal history of other diseases of the respiratory system: Secondary | ICD-10-CM

## 2021-08-13 DIAGNOSIS — R21 Rash and other nonspecific skin eruption: Secondary | ICD-10-CM | POA: Diagnosis not present

## 2021-08-13 NOTE — Telephone Encounter (Signed)
Per Osvaldo Human. "Good morning Tonya Lee patient Tonya Lee [birth year] needs referral placed for [facility]. She has appt today and she and Marcelino Duster have discussed it. Can you check on this?"

## 2021-08-13 NOTE — Progress Notes (Signed)
New Patient Note  RE: Tonya Lee MRN: [id number] DOB: [birth year] Date of Office Visit: 08/13/2021  Consult requested by: Dettinger, Elige Radon, MD Primary care provider: Sonny Masters, FNP  Chief Complaint: Urticaria  History of Present Illness: I had the pleasure of seeing Tonya Lee for initial evaluation at the [facility] on 08/14/2021. She is a 36 y.o. female, who is referred here by Sonny Masters, FNP for the evaluation of rash.  Rash started about 2+ weeks ago. Initially started off as a small rash on her upper torso then it spread to her lower torso. The rash never completely resolved but it is much better now.   Describes them as itchy, red, slightly raised. No ecchymosis upon resolution. Associated symptoms include: none.  Suspected triggers are unknown. Denies any fevers, chills, changes in medications, foods, personal care products or recent infections. She has tried the following therapies: prednisone, Pepcid, hydrocortisone, benadryl with some benefit. Systemic steroids yes.  Previous work up includes: negative alpha gal panel. Previous history of rash/hives:  Patient has environmental allergies which interestingly improved the past few years. She is also sensitive to various personal care products.  Patient is up to date with the following cancer screening tests: physical exam, pap smears.  Assessment and Plan: [name] is a 36 y.o. female with: Pruritic rash Pruritic rash x 2+ weeks. Initially started on upper torso then spread. Now improved after prednisone, Pepcid, benadryl and hydrocortisone. Multiple tick bites but had negative alpha gal bloodwork. Denies any changes in diet, meds, personal care products or recent infections. Unable to skin test today due to recent antihistamine intake. Based on clinical history, I'm not sure the trigger of the rash but it seems to be improving.  Start zyrtec (cetirizine) 10mg  1-2 times a day. If symptoms  are not controlled or causes drowsiness let know. May take benadryl 25mg  as needed every 6 hours.  Avoid the following potential triggers: alcohol, tight clothing, NSAIDs, hot showers and getting overheated. See below for proper skin care. Take pictures of the rash.  If you have a bad flare let Tonya Lee know.  Get bloodwork if no improvement after 2 weeks.  Other allergic rhinitis Skin testing as a child showed multiple positives. No prior AIT. Allergic symptoms improved the last few years.  Monitor symptoms.   History of asthma Mainly exercise induced. No recent albuterol use. Monitor symptoms.  Return in about 6 weeks (around 09/24/2021).  No orders of the defined types were placed in this encounter.  Lab Orders         Allergens w/Total IgE Area 2         ANA w/Reflex         CBC with Differential/Platelet         Chronic Urticaria         Comprehensive metabolic panel         C-reactive protein         Sedimentation rate         Thyroid Cascade Profile         Tryptase         C3 and C4      Other allergy screening: Asthma: yes Exercise induced now and rarely uses albuterol.  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 No prior AIT. Used to take antihistamines prn.  Food allergy: no Medication allergy:  Sulfa  Hymenoptera allergy: no Eczema:no History of recurrent infections suggestive  of immunodeficency: no  Diagnostics: Skin Testing: Deferred due to recent antihistamines use.  Past Medical History: Patient Active Problem List   Diagnosis Date Noted   Pruritic rash 08/14/2021   Other allergic rhinitis 08/14/2021   History of asthma 08/14/2021   Mild persistent asthma with acute exacerbation 07/07/2018   Migraine with aura and without status migrainosus, not intractable 10/11/2017   S/P right knee arthroscopy 04/19/2017   PUD (peptic ulcer disease) 04/18/2017   Colitis 04/16/2017   GERD (gastroesophageal reflux disease)  02/28/2015   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)    GERD (gastroesophageal reflux disease)    Migraines    Peptic ulcer disease    Past Surgical History: Past Surgical History:  Procedure Laterality Date   ANKLE SURGERY Right july 2013   FASCIECTOMY Right    right lower leg   KNEE SURGERY     Medication List:  Current Outpatient Medications  Medication Sig Dispense Refill   cetirizine (ZYRTEC) 10 MG tablet Take 1 tablet (10 mg total) by mouth daily. 30 tablet 11   diphenhydrAMINE (BENADRYL) 25 MG tablet Take 25 mg by mouth every 6 (six) hours as needed.     famotidine (PEPCID) 20 MG tablet Take 1 tablet (20 mg total) by mouth 2 (two) times daily for 14 days. 28 tablet 0   Multiple Vitamin (MULTIVITAMIN ADULT PO) Multivitamin     No current facility-administered medications for this visit.   Allergies: Allergies  Allergen Reactions   Sulfa Antibiotics Anaphylaxis, Hives and Other (See Comments)    Throat and face swelled   Acyclovir And Related Hives   Ciprofloxacin    Nitrofuran Derivatives Nausea Only    Abdominal pain   Social History: Social History   Socioeconomic History   Marital status: Married    Spouse name: Not on file   Number of children: Not on file   Years of education: Not on file   Highest education level: Not on file  Occupational History   Not on file  Tobacco Use   Smoking status: Never   Smokeless tobacco: Never  Vaping Use   Vaping Use: Never used  Substance and Sexual Activity   Alcohol use: No   Drug use: No   Sexual activity: Yes  Other Topics Concern   Not on file  Social History Narrative   Not on file   Social Determinants of Health   Financial Resource Strain: Not on file  Food Insecurity: Not on file  Transportation Needs: Not on file  Physical Activity: Not on file  Stress: Not on file  Social Connections: Not on file   Lives in a 35 year old house. Smoking:  denies Occupation: Dispensing optician History: Water Damage/mildew in the house: no Carpet in the family room: no Carpet in the bedroom: no Heating: electric Cooling: central Pet: yes 1 dog x 10+ years  Family History: Family History  Problem Relation Age of Onset   Asthma Mother    Hyperlipidemia Maternal Grandmother    Hypertension Maternal Grandmother    Asthma Maternal Grandmother    Heart disease Maternal Grandfather    Cancer Maternal Grandfather        thyroid   Diabetes Maternal Grandfather    Heart disease Paternal Grandfather    Review of Systems  Constitutional:  Negative for appetite change, chills, fever and unexpected weight change.  HENT:  Negative for congestion and rhinorrhea.  Eyes:  Negative for itching.  Respiratory:  Negative for cough, chest tightness, shortness of breath and wheezing.   Cardiovascular:  Negative for chest pain.  Gastrointestinal:  Negative for abdominal pain.  Genitourinary:  Negative for difficulty urinating.  Skin:  Positive for rash.  Neurological:  Negative for headaches.    Objective: BP 98/62   Pulse 71   Temp 98.7 F (37.1 C) (Temporal)   Resp 18   Ht 5\' 5"  (1.651 m)   Wt 124 lb (56.2 kg)   SpO2 99%   BMI 20.63 kg/m  Body mass index is 20.63 kg/m. Physical Exam Vitals and nursing note reviewed.  Constitutional:      Appearance: Normal appearance. She is well-developed.  HENT:     Head: Normocephalic and atraumatic.     Right Ear: Tympanic membrane and external ear normal.     Left Ear: Tympanic membrane and external ear normal.     Nose: Nose normal.     Mouth/Throat:     Mouth: Mucous membranes are moist.     Pharynx: Oropharynx is clear.  Eyes:     Conjunctiva/sclera: Conjunctivae normal.  Cardiovascular:     Rate and Rhythm: Normal rate and regular rhythm.     Heart sounds: Normal heart sounds. No murmur heard.    No friction rub. No gallop.  Pulmonary:     Effort: Pulmonary effort is  normal.     Breath sounds: Normal breath sounds. No wheezing, rhonchi or rales.  Musculoskeletal:     Cervical back: Neck supple.  Skin:    General: Skin is warm.     Findings: Rash present.     Comments: Erythematous hue on the anterior chest/neck area.  Neurological:     Mental Status: She is alert and oriented to person, place, and time.  Psychiatric:        Behavior: Behavior normal.    The plan was reviewed with the patient/family, and all questions/concerned were addressed.  It was my pleasure to see Tenae today and participate in her care. Please feel free to contact me with any questions or concerns.  Sincerely,  , DO Allergy & Immunology  [facility] office: [phone] [facility] office: [phone]

## 2021-08-13 NOTE — Patient Instructions (Addendum)
Rash Not sure what's causing your itchy rash.  Start zyrtec (cetirizine) 10mg  1-2 times a day. If symptoms are not controlled or causes drowsiness let know. May take benadryl 25mg  as needed every 6 hours.  Avoid the following potential triggers: alcohol, tight clothing, NSAIDs, hot showers and getting overheated. See below for proper skin care. Take pictures of the rash.  If you have a bad flare let us know.  Get bloodwork if not better after 2 weeks. We are ordering labs, so please allow 1-2 weeks for the results to come back. With the newly implemented Cures Act, the labs might be visible to you at the same time that they become visible to me. However, I will not address the results until all of the results are back, so please be patient.    Follow up in 6 weeks or sooner if needed.  Skin care recommendations  Bath time: Always use lukewarm water. AVOID very hot or cold water. Keep bathing time to 5-10 minutes. Do NOT use bubble bath. Use a mild soap and use just enough to wash the dirty areas. Do NOT scrub skin vigorously.  After bathing, pat dry your skin with a towel. Do NOT rub or scrub the skin.  Moisturizers and prescriptions:  ALWAYS apply moisturizers immediately after bathing (within 3 minutes). This helps to lock-in moisture. Use the moisturizer several times a day over the whole body. Good summer moisturizers include: Aveeno, CeraVe, Cetaphil. Good winter moisturizers include: Aquaphor, Vaseline, Cerave, Cetaphil, Eucerin, Vanicream. When using moisturizers along with medications, the moisturizer should be applied about one hour after applying the medication to prevent diluting effect of the medication or moisturize around where you applied the medications. When not using medications, the moisturizer can be continued twice daily as maintenance.  Laundry and clothing: Avoid laundry products with added color or perfumes. Use unscented hypo-allergenic laundry products  such as Tide free, Cheer free & gentle, and All free and clear.  If the skin still seems dry or sensitive, you can try double-rinsing the clothes. Avoid tight or scratchy clothing such as wool. Do not use fabric softeners or dyer sheets.

## 2021-08-13 NOTE — Telephone Encounter (Signed)
Pt has apt today with [facility] in [facility] [place] fax [phone]. Can a referral be sent today?

## 2021-08-14 ENCOUNTER — Encounter: Payer: Self-pay | Admitting: Allergy

## 2021-08-14 DIAGNOSIS — L282 Other prurigo: Secondary | ICD-10-CM | POA: Insufficient documentation

## 2021-08-14 DIAGNOSIS — Z8709 Personal history of other diseases of the respiratory system: Secondary | ICD-10-CM | POA: Insufficient documentation

## 2021-08-14 DIAGNOSIS — J3089 Other allergic rhinitis: Secondary | ICD-10-CM | POA: Insufficient documentation

## 2021-08-14 NOTE — Assessment & Plan Note (Signed)
Mainly exercise induced. No recent albuterol use.  Monitor symptoms.

## 2021-08-14 NOTE — Assessment & Plan Note (Signed)
Pruritic rash x 2+ weeks. Initially started on upper torso then spread. Now improved after prednisone, Pepcid, benadryl and hydrocortisone. Multiple tick bites but had negative alpha gal bloodwork. Denies any changes in diet, meds, personal care products or recent infections.  Unable to skin test today due to recent antihistamine intake.  Based on clinical history, I'm not sure the trigger of the rash but it seems to be improving.  . Start zyrtec (cetirizine) 10mg  1-2 times a day. . If symptoms are not controlled or causes drowsiness let know. . May take benadryl 25mg  as needed every 6 hours.  . Avoid the following potential triggers: alcohol, tight clothing, NSAIDs, hot showers and getting overheated. . See below for proper skin care. . Take pictures of the rash.  o If you have a bad flare let us know.   Get bloodwork if no improvement after 2 weeks.

## 2021-08-14 NOTE — Assessment & Plan Note (Signed)
Skin testing as a child showed multiple positives. No prior AIT. Allergic symptoms improved the last few years.   Monitor symptoms.

## 2021-08-18 ENCOUNTER — Encounter: Payer: Self-pay | Admitting: Allergy

## 2021-08-26 LAB — CBC WITH DIFFERENTIAL/PLATELET
Basophils Absolute: 0.1 10*3/uL (ref 0.0–0.2)
Basos: 1 %
EOS (ABSOLUTE): 0.3 10*3/uL (ref 0.0–0.4)
Eos: 4 %
Hematocrit: 42.5 % (ref 34.0–46.6)
Hemoglobin: 14.2 g/dL (ref 11.1–15.9)
Immature Grans (Abs): 0 10*3/uL (ref 0.0–0.1)
Immature Granulocytes: 0 %
Lymphocytes Absolute: 2.3 10*3/uL (ref 0.7–3.1)
Lymphs: 39 %
MCH: 29 pg (ref 26.6–33.0)
MCHC: 33.4 g/dL (ref 31.5–35.7)
MCV: 87 fL (ref 79–97)
Monocytes Absolute: 0.6 10*3/uL (ref 0.1–0.9)
Monocytes: 11 %
Neutrophils Absolute: 2.7 10*3/uL (ref 1.4–7.0)
Neutrophils: 45 %
Platelets: 423 10*3/uL (ref 150–450)
RBC: 4.9 x10E6/uL (ref 3.77–5.28)
RDW: 12.9 % (ref 11.7–15.4)
WBC: 5.9 10*3/uL (ref 3.4–10.8)

## 2021-08-26 LAB — COMPREHENSIVE METABOLIC PANEL
ALT: 10 IU/L (ref 0–32)
AST: 10 IU/L (ref 0–40)
Albumin/Globulin Ratio: 1.9 (ref 1.2–2.2)
Albumin: 4.8 g/dL (ref 3.9–4.9)
Alkaline Phosphatase: 57 IU/L (ref 44–121)
BUN/Creatinine Ratio: 16 (ref 9–23)
BUN: 13 mg/dL (ref 6–20)
Bilirubin Total: 0.3 mg/dL (ref 0.0–1.2)
CO2: 23 mmol/L (ref 20–29)
Calcium: 9.5 mg/dL (ref 8.7–10.2)
Chloride: 103 mmol/L (ref 96–106)
Creatinine, Ser: 0.8 mg/dL (ref 0.57–1.00)
Globulin, Total: 2.5 g/dL (ref 1.5–4.5)
Glucose: 90 mg/dL (ref 70–99)
Potassium: 4.6 mmol/L (ref 3.5–5.2)
Sodium: 141 mmol/L (ref 134–144)
Total Protein: 7.3 g/dL (ref 6.0–8.5)
eGFR: 99 mL/min/{1.73_m2} (ref >59)

## 2021-08-26 LAB — ALLERGENS W/TOTAL IGE AREA 2
Alternaria Alternata IgE: <0.1 kU/L
Aspergillus Fumigatus IgE: <0.1 kU/L
Bermuda Grass IgE: <0.1 kU/L
Cat Dander IgE: 1.4 kU/L — AB
Cedar, Mountain IgE: <0.1 kU/L
Cladosporium Herbarum IgE: <0.1 kU/L
Cockroach, German IgE: <0.1 kU/L
Common Silver Birch IgE: <0.1 kU/L
Cottonwood IgE: <0.1 kU/L
D Farinae IgE: <0.1 kU/L
D Pteronyssinus IgE: <0.1 kU/L
Dog Dander IgE: 1.38 kU/L — AB
Elm, American IgE: <0.1 kU/L
IgE (Immunoglobulin E), Serum: 18 IU/mL (ref 6–495)
Johnson Grass IgE: <0.1 kU/L
Maple/Box Elder IgE: <0.1 kU/L
Mouse Urine IgE: <0.1 kU/L
Oak, White IgE: <0.1 kU/L
Pecan, Hickory IgE: <0.1 kU/L
Penicillium Chrysogen IgE: <0.1 kU/L
Pigweed, Rough IgE: <0.1 kU/L
Ragweed, Short IgE: <0.1 kU/L
Sheep Sorrel IgE Qn: <0.1 kU/L
Timothy Grass IgE: <0.1 kU/L
White Mulberry IgE: <0.1 kU/L

## 2021-08-26 LAB — C-REACTIVE PROTEIN: CRP: <1 mg/L (ref 0–10)

## 2021-08-26 LAB — SEDIMENTATION RATE: Sed Rate: 2 mm/hr (ref 0–32)

## 2021-08-26 LAB — C3 AND C4
Complement C3, Serum: 114 mg/dL (ref 82–167)
Complement C4, Serum: 14 mg/dL (ref 12–38)

## 2021-08-26 LAB — ANA W/REFLEX: Anti Nuclear Antibody (ANA): NEGATIVE

## 2021-08-26 LAB — CHRONIC URTICARIA: cu index: 4.2 (ref <10)

## 2021-08-26 LAB — TRYPTASE: Tryptase: 7.2 ug/L (ref 2.2–13.2)

## 2021-08-26 LAB — THYROID CASCADE PROFILE: TSH: 2.38 u[IU]/mL (ref 0.450–4.500)

## 2021-09-23 NOTE — Progress Notes (Deleted)
Follow Up Note  RE: Tonya Lee MRN: [id number] DOB: 22-Aug-1985 Date of Office Visit: 09/24/2021  Referring provider: Sonny Masters, FNP Primary care provider: Sonny Masters, FNP  Chief Complaint: No chief complaint on file.  History of Present Illness: I had the pleasure of seeing Tonya Lee for a follow up visit at the [facility] on 09/23/2021. She is a 36 y.o. female, who is being followed for pruritic rash, allergic rhinitis. Her previous allergy office visit was on 08/13/2021 with Dr. Selena Batten. Today is a regular follow up visit.  Pruritic rash Pruritic rash x 2+ weeks. Initially started on upper torso then spread. Now improved after prednisone, Pepcid, benadryl and hydrocortisone. Multiple tick bites but had negative alpha gal bloodwork. Denies any changes in diet, meds, personal care products or recent infections. Unable to skin test today due to recent antihistamine intake. Based on clinical history, I'm not sure the trigger of the rash but it seems to be improving.  Start zyrtec (cetirizine) 10mg  1-2 times a day. If symptoms are not controlled or causes drowsiness let know. May take benadryl 25mg  as needed every 6 hours.  Avoid the following potential triggers: alcohol, tight clothing, NSAIDs, hot showers and getting overheated. See below for proper skin care. Take pictures of the rash.  If you have a bad flare let us know.  Get bloodwork if no improvement after 2 weeks.   Other allergic rhinitis Skin testing as a child showed multiple positives. No prior AIT. Allergic symptoms improved the last few years.  Monitor symptoms.    History of asthma Mainly exercise induced. No recent albuterol use. Monitor symptoms.   Return in about 6 weeks (around 09/24/2021).  Avoid all red meat including pork, beef, lamb. Monitor symptoms after eating dairy and gelatin products.   I reviewed the bloodwork. Blood count, kidney function, liver function,  electrolytes, thyroid, autoimmune screener, inflammation markers, chronic urticaria index (checks for autoantibodies that trigger mast cells), tryptase (checks for mast cell issues) were all normal which is great.    Environmental panel was positive to cat and dog.  See below for control measures.   Based on these results other than possibly your pet contributing to your rash no other underlying cause identified.   Assessment and Plan: Tonya Lee is a 36 y.o. female with: No problem-specific Assessment & Plan notes found for this encounter.  No follow-ups on file.  No orders of the defined types were placed in this encounter.  Lab Orders  No laboratory test(s) ordered today    Diagnostics: Spirometry:  Tracings reviewed. Her effort: {Blank single:19197::"Good reproducible efforts.","It was hard to get consistent efforts and there is a question as to whether this reflects a maximal maneuver.","Poor effort, data can not be interpreted."} FVC: ***L FEV1: ***L, ***% predicted FEV1/FVC ratio: ***% Interpretation: {Blank single:19197::"Spirometry consistent with mild obstructive disease","Spirometry consistent with moderate obstructive disease","Spirometry consistent with severe obstructive disease","Spirometry consistent with possible restrictive disease","Spirometry consistent with mixed obstructive and restrictive disease","Spirometry uninterpretable due to technique","Spirometry consistent with normal pattern","No overt abnormalities noted given today's efforts"}.  Please see scanned spirometry results for details.  Skin Testing: {Blank single:19197::"Select foods","Environmental allergy panel","Environmental allergy panel and select foods","Food allergy panel","None","Deferred due to recent antihistamines use"}. *** Results discussed with patient/family.   Medication List:  Current Outpatient Medications  Medication Sig Dispense Refill   cetirizine (ZYRTEC) 10 MG tablet Take 1 tablet  (10 mg total) by mouth daily. 30 tablet 11   diphenhydrAMINE (  BENADRYL) 25 MG tablet Take 25 mg by mouth every 6 (six) hours as needed.     famotidine (PEPCID) 20 MG tablet Take 1 tablet (20 mg total) by mouth 2 (two) times daily for 14 days. 28 tablet 0   Multiple Vitamin (MULTIVITAMIN ADULT PO) Multivitamin     No current facility-administered medications for this visit.   Allergies: Allergies  Allergen Reactions   Sulfa Antibiotics Anaphylaxis, Hives and Other (See Comments)    Throat and face swelled   Acyclovir And Related Hives   Ciprofloxacin    Nitrofuran Derivatives Nausea Only    Abdominal pain   I reviewed her past medical history, social history, family history, and environmental history and no significant changes have been reported from her previous visit.  Review of Systems  Constitutional:  Negative for appetite change, chills, fever and unexpected weight change.  HENT:  Negative for congestion and rhinorrhea.   Eyes:  Negative for itching.  Respiratory:  Negative for cough, chest tightness, shortness of breath and wheezing.   Cardiovascular:  Negative for chest pain.  Gastrointestinal:  Negative for abdominal pain.  Genitourinary:  Negative for difficulty urinating.  Skin:  Positive for rash.  Neurological:  Negative for headaches.    Objective: There were no vitals taken for this visit. There is no height or weight on file to calculate BMI. Physical Exam Vitals and nursing note reviewed.  Constitutional:      Appearance: Normal appearance. She is well-developed.  HENT:     Head: Normocephalic and atraumatic.     Right Ear: Tympanic membrane and external ear normal.     Left Ear: Tympanic membrane and external ear normal.     Nose: Nose normal.     Mouth/Throat:     Mouth: Mucous membranes are moist.     Pharynx: Oropharynx is clear.  Eyes:     Conjunctiva/sclera: Conjunctivae normal.  Cardiovascular:     Rate and Rhythm: Normal rate and regular  rhythm.     Heart sounds: Normal heart sounds. No murmur heard.    No friction rub. No gallop.  Pulmonary:     Effort: Pulmonary effort is normal.     Breath sounds: Normal breath sounds. No wheezing, rhonchi or rales.  Musculoskeletal:     Cervical back: Neck supple.  Skin:    General: Skin is warm.     Findings: Rash present.     Comments: Erythematous hue on the anterior chest/neck area.  Neurological:     Mental Status: She is alert and oriented to person, place, and time.  Psychiatric:        Behavior: Behavior normal.    Previous notes and tests were reviewed. The plan was reviewed with the patient/family, and all questions/concerned were addressed.  It was my pleasure to see Lizeth today and participate in her care. Please feel free to contact me with any questions or concerns.  Sincerely,  Wyline Mood, DO Allergy & Immunology  [facility] office: [phone] [facility] office: [phone]

## 2021-09-24 ENCOUNTER — Encounter: Payer: Self-pay | Admitting: Allergy

## 2021-09-24 ENCOUNTER — Ambulatory Visit: Payer: BC Managed Care – PPO | Admitting: Allergy

## 2021-09-24 VITALS — BP 102/66 | HR 82 | Temp 98.0°F | Resp 16

## 2021-09-24 DIAGNOSIS — Z8709 Personal history of other diseases of the respiratory system: Secondary | ICD-10-CM

## 2021-09-24 DIAGNOSIS — J3089 Other allergic rhinitis: Secondary | ICD-10-CM | POA: Diagnosis not present

## 2021-09-24 DIAGNOSIS — L282 Other prurigo: Secondary | ICD-10-CM | POA: Diagnosis not present

## 2021-09-24 MED ORDER — MONTELUKAST SODIUM 10 MG PO TABS: 10.0000 mg | ORAL_TABLET | Freq: Every day | ORAL | 2 refills | Status: DC

## 2021-09-24 MED ORDER — FAMOTIDINE 20 MG PO TABS: 20.0000 mg | ORAL_TABLET | Freq: Two times a day (BID) | ORAL | 2 refills | Status: DC

## 2021-09-24 NOTE — Assessment & Plan Note (Signed)
Past history - Mainly exercise induced. No recent albuterol use.  Monitor symptoms.

## 2021-09-24 NOTE — Assessment & Plan Note (Signed)
Past history - Pruritic rash x 2 months. Initially started on upper torso then spread. Now improved after prednisone, Pepcid, benadryl and hydrocortisone. Multiple tick bites but had negative alpha gal bloodwork. Denies any changes in diet, meds, personal care products or recent infections. Interim history - bloodwork unremarkable. Still breaking out with no triggers noted. Zyrtec somewhat effective.  Today's skin prick testing showed: Positive to cat. Borderline positive to green pea and garlic.   Start fexofenadine (allegra) 180mg  twice a day.  If symptoms are not controlled or causes drowsiness let know.  Start pepcid (famotidine) 20mg  twice a day.   Start Singulair (montelukast) 10mg  daily at night if no improvement after 2 weeks.  Cautioned that in some children/adults can experience behavioral changes including hyperactivity, agitation, depression, sleep disturbances and suicidal ideations. These side effects are rare, but if you notice them you should notify me and discontinue Singulair (montelukast). . Avoid the following potential triggers: alcohol, tight clothing, NSAIDs, hot showers and getting overheated. [place] Keep track of episodes. . If no improvement will refer to dermatology next for possible skin biopsy.  Avoid peas, garlic, dairy - not sure if contributing to itchy rash or not.  . Continue strict avoidance of all red meat due to GI symptoms.  . For mild symptoms you can take over the counter antihistamines such as Benadryl and monitor symptoms closely. If symptoms worsen or if you have severe symptoms including breathing issues, throat closure, significant swelling, whole body hives, severe diarrhea and vomiting, lightheadedness then seek immediate medical care.

## 2021-09-24 NOTE — Patient Instructions (Signed)
Today's skin testing showed: Positive to cat. Borderline positive to green pea and garlic.   Results given.  Rash/skin: Start fexofenadine (allegra) 180mg  twice a day. If symptoms are not controlled or causes drowsiness let know. Start pepcid (famotidine) 20mg  twice a day.  Start Singulair (montelukast) 10mg  daily at night if no improvement after 2 weeks. Cautioned that in some children/adults can experience behavioral changes including hyperactivity, agitation, depression, sleep disturbances and suicidal ideations. These side effects are rare, but if you notice them you should notify me and discontinue Singulair (montelukast). Avoid the following potential triggers: alcohol, tight clothing, NSAIDs, hot showers and getting overheated. Keep track of episodes.  Food Avoid peas, garlic, dairy Continue strict avoidance of all red meat. For mild symptoms you can take over the counter antihistamines such as Benadryl and monitor symptoms closely. If symptoms worsen or if you have severe symptoms including breathing issues, throat closure, significant swelling, whole body hives, severe diarrhea and vomiting, lightheadedness then seek immediate medical care.  Environmental allergies Start environmental control measures as below.  Follow up in 6 weeks or sooner if needed.    Pet Allergen Avoidance: Contrary to popular opinion, there are no "hypoallergenic" breeds of dogs or cats. That is because people are not allergic to an animal's hair, but to an allergen found in the animal's saliva, dander (dead skin flakes) or urine. Pet allergy symptoms typically occur within minutes. For some people, symptoms can build up and become most severe 8 to 12 hours after contact with the animal. People with severe allergies can experience reactions in public places if dander has been transported on the pet owners' clothing. Keeping an animal outdoors is only a partial solution, since homes with pets in the yard  still have higher concentrations of animal allergens. Before getting a pet, ask your allergist to determine if you are allergic to animals. If your pet is already considered part of your family, try to minimize contact and keep the pet out of the bedroom and other rooms where you spend a great deal of time. As with dust mites, vacuum carpets often or replace carpet with a hardwood floor, tile or linoleum. High-efficiency particulate air (HEPA) cleaners can reduce allergen levels over time. While dander and saliva are the source of cat and dog allergens, urine is the source of allergens from rabbits, hamsters, mice and [place] pigs; so ask a non-allergic family member to clean the animal's cage. If you have a pet allergy, talk to your allergist about the potential for allergy immunotherapy (allergy shots). This strategy can often provide long-term relief.

## 2021-09-24 NOTE — Assessment & Plan Note (Signed)
Past history - Skin testing as a child showed multiple positives. No prior AIT. Allergic symptoms improved the last few years.  Interim history - 2023 bloodwork positive to cat and dog.  Today's skin prick testing showed: Positive to cat.  Start environmental control measures as below.  The antihistamines above should also help with these symptoms.

## 2021-09-24 NOTE — Progress Notes (Signed)
Follow Up Note  RE: Sherrel Reder MRN: [id number] DOB: [birth year] Date of Office Visit: 09/24/2021  Referring provider: Baruch Gouty, FNP Primary care provider: Baruch Gouty, FNP  Chief Complaint: Rash (Is still having the rash going on. It develops below the chin to her belly button and on her forearms. Is itchy.), Allergy Testing, and Other (Vomit 2 times after eating meat. Is avoiding some dairy not all )  History of Present Illness: I had the pleasure of seeing Tonya Lee for a follow up visit at the [facility] on 09/24/2021. She is a 36 y.o. female, who is being followed for pruritic rash, allergic rhinitis and h/o asthma. Her previous allergy office visit was on 08/13/2021 with Dr. Maudie Mercury. Today is a skin testing and follow up visit.  Pruritic rash Still breaking out from below the chin to the belly button. This occurs on a weekly basis.  No triggers.  She had vomiting a few hours after eating red meat on 2 separate occasions. Limited dairy intake but noted some itching after eating too much cheese.  She did take zyrtec 10mg  BID and benadryl prn with some benefit.  Last flare was 4 days ago.   Reviewed images - scattered areas of erythema on anterior chest noted.   Other allergic rhinitis Patient has 1 dog at home.   Assessment and Plan: [name] is a 36 y.o. female with: Pruritic rash Past history - Pruritic rash x 2 months. Initially started on upper torso then spread. Now improved after prednisone, Pepcid, benadryl and hydrocortisone. Multiple tick bites but had negative alpha gal bloodwork. Denies any changes in diet, meds, personal care products or recent infections. Interim history - bloodwork unremarkable. Still breaking out with no triggers noted. Zyrtec somewhat effective. Today's skin prick testing showed: Positive to cat. Borderline positive to green pea and garlic.  Start fexofenadine (allegra) 180mg  twice a day. If symptoms are not  controlled or causes drowsiness let us know. Start pepcid (famotidine) 20mg  twice a day.  Start Singulair (montelukast) 10mg  daily at night if no improvement after 2 weeks. Cautioned that in some children/adults can experience behavioral changes including hyperactivity, agitation, depression, sleep disturbances and suicidal ideations. These side effects are rare, but if you notice them you should notify me and discontinue Singulair (montelukast). Avoid the following potential triggers: alcohol, tight clothing, NSAIDs, hot showers and getting overheated. Keep track of episodes. If no improvement will refer to dermatology next for possible skin biopsy.  Avoid peas, garlic, dairy - not sure if contributing to itchy rash or not.  Continue strict avoidance of all red meat due to GI symptoms.  For mild symptoms you can take over the counter antihistamines such as Benadryl and monitor symptoms closely. If symptoms worsen or if you have severe symptoms including breathing issues, throat closure, significant swelling, whole body hives, severe diarrhea and vomiting, lightheadedness then seek immediate medical care.  Other allergic rhinitis Past history - Skin testing as a child showed multiple positives. No prior AIT. Allergic symptoms improved the last few years.  Interim history - 2023 bloodwork positive to cat and dog. Today's skin prick testing showed: Positive to cat. Start environmental control measures as below. The antihistamines above should also help with these symptoms.  History of asthma Past history - Mainly exercise induced. No recent albuterol use. Monitor symptoms.  Return in about 6 weeks (around 11/05/2021).  Meds ordered this encounter  Medications   famotidine (PEPCID) 20 MG tablet  Sig: Take 1 tablet (20 mg total) by mouth 2 (two) times daily.    Dispense:  60 tablet    Refill:  2   montelukast (SINGULAIR) 10 MG tablet    Sig: Take 1 tablet (10 mg total) by mouth at  bedtime.    Dispense:  30 tablet    Refill:  2   Lab Orders  No laboratory test(s) ordered today    Diagnostics: Skin Testing: Environmental allergy panel and food panel. Positive to cat. Borderline positive to green pea and garlic.  Results discussed with patient/family.  Airborne Adult Perc - 09/24/21 1420     Time Antigen Placed 1420    Allergen Manufacturer Waynette Buttery    Location Back    Number of Test 59    1. Control-Buffer 50% Glycerol Negative    2. Control-Histamine 1 mg/ml 2+    3. Albumin saline Negative    4. Bahia Negative    5. French Southern Territories Negative    6. Johnson Negative    7. Kentucky Blue Negative    8. Meadow Fescue Negative    9. Perennial Rye Negative    10. Sweet Vernal Negative    11. Timothy Negative    12. Cocklebur Negative    13. Burweed Marshelder Negative    14. Ragweed, short Negative    15. Ragweed, Giant Negative    16. Plantain,  English Negative    17. Lamb's Quarters Negative    18. Sheep Sorrell Negative    19. Rough Pigweed Negative    20. Marsh Elder, Rough Negative    21. Mugwort, Common Negative    22. Ash mix Negative    23. Birch mix Negative    24. Beech American Negative    25. Box, Elder Negative    26. Cedar, red Negative    27. Cottonwood, Guinea-Bissau Negative    28. Elm mix Negative    29. Hickory Negative    30. Maple mix Negative    31. Oak, Guinea-Bissau mix Negative    32. Pecan Pollen Negative    33. Pine mix Negative    34. Sycamore Eastern Negative    35. Walnut, Black Pollen Negative    36. Alternaria alternata Negative    37. Cladosporium Herbarum Negative    38. Aspergillus mix Negative    39. Penicillium mix Negative    40. Bipolaris sorokiniana (Helminthosporium) Negative    41. Drechslera spicifera (Curvularia) Negative    42. Mucor plumbeus Negative    43. Fusarium moniliforme Negative    44. Aureobasidium pullulans (pullulara) Negative    45. Rhizopus oryzae Negative    46. Botrytis cinera Negative    47.  Epicoccum nigrum Negative    48. Phoma betae Negative    49. Candida Albicans Negative    50. Trichophyton mentagrophytes Negative    51. Mite, D Farinae  5,000 AU/ml Negative    52. Mite, D Pteronyssinus  5,000 AU/ml Negative    53. Cat Hair 10,000 BAU/ml 3+    54.  Dog Epithelia Negative    55. Mixed Feathers Negative    56. Horse Epithelia Negative    57. Cockroach, German Negative    58. Mouse Negative    59. Tobacco Leaf Negative             Food Adult Perc - 09/24/21 1400     Time Antigen Placed 1420    Allergen Manufacturer Waynette Buttery    Location Back    Number of  allergen test 72     Control-buffer 50% Glycerol Negative    Control-Histamine 1 mg/ml 2+    1. Peanut Negative    2. Soybean Negative    3. Wheat Negative    4. Sesame Negative    5. Milk, cow Negative    6. Egg White, Chicken Negative    7. Casein Negative    8. Shellfish Mix Negative    9. Fish Mix Negative    10. Cashew Negative    11. Pecan Food Negative    12. Myrtle Creek Negative    13. Almond Negative    14. Hazelnut Negative    15. Bolivia nut Negative    16. Coconut Negative    17. Pistachio Negative    18. Catfish Negative    19. Bass Negative    20. Trout Negative    21. Tuna Negative    22. Salmon Negative    23. Flounder Negative    24. Codfish Negative    25. Shrimp Negative    26. Crab Negative    27. Lobster Negative    28. Oyster Negative    29. Scallops Negative    30. Barley Negative    31. Oat  Negative    32. Rye  Negative    33. Hops Negative    34. Rice Negative    35. Cottonseed Negative    36. Saccharomyces Cerevisiae  Negative    37. Pork Negative    38. Kuwait Meat Negative    39. Chicken Meat Negative    40. Beef Negative    41. Lamb Negative    42. Tomato Negative    43. White Potato Negative    44. Sweet Potato Negative    45. Pea, Green/English --   +/-   46. Navy Bean Negative    47. Mushrooms Negative    48. Avocado Negative    49. Onion Negative     50. Cabbage Negative    51. Carrots Negative    52. Celery Negative    53. Corn Negative    54. Cucumber Negative    55. Grape (White seedless) Negative    56. Orange  Negative    57. Banana Negative    58. Apple Negative    59. Peach Negative    60. Strawberry Negative    61. Cantaloupe Negative    62. Watermelon Negative    63. Pineapple Negative    64. Chocolate/Cacao bean Negative    65. Karaya Gum Negative    66. Acacia (Arabic Gum) Negative    67. Cinnamon Negative    68. Nutmeg Negative    69. Ginger Negative    70. Garlic --   +/-   71. Pepper, black Negative    72. Mustard Negative             Medication List:  Current Outpatient Medications  Medication Sig Dispense Refill   cetirizine (ZYRTEC) 10 MG tablet Take 1 tablet (10 mg total) by mouth daily. 30 tablet 11   diphenhydrAMINE (BENADRYL) 25 MG tablet Take 25 mg by mouth every 6 (six) hours as needed.     famotidine (PEPCID) 20 MG tablet Take 1 tablet (20 mg total) by mouth 2 (two) times daily. 60 tablet 2   montelukast (SINGULAIR) 10 MG tablet Take 1 tablet (10 mg total) by mouth at bedtime. 30 tablet 2   Multiple Vitamin (MULTIVITAMIN ADULT PO) Multivitamin     No current facility-administered  medications for this visit.   Allergies: Allergies  Allergen Reactions   Sulfa Antibiotics Anaphylaxis, Hives and Other (See Comments)    Throat and face swelled   Acyclovir And Related Hives   Ciprofloxacin    Nitrofuran Derivatives Nausea Only    Abdominal pain   I reviewed her past medical history, social history, family history, and environmental history and no significant changes have been reported from her previous visit.  Review of Systems  Constitutional:  Negative for appetite change, chills, fever and unexpected weight change.  HENT:  Negative for congestion and rhinorrhea.   Eyes:  Negative for itching.  Respiratory:  Negative for cough, chest tightness, shortness of breath and wheezing.    Cardiovascular:  Negative for chest pain.  Gastrointestinal:  Negative for abdominal pain.  Genitourinary:  Negative for difficulty urinating.  Skin:  Positive for rash.  Allergic/Immunologic: Positive for environmental allergies.  Neurological:  Negative for headaches.    Objective: BP 102/66   Pulse 82   Temp 98 F (36.7 C)   Resp 16   SpO2 99%  There is no height or weight on file to calculate BMI. Physical Exam Vitals and nursing note reviewed.  Constitutional:      Appearance: Normal appearance. She is well-developed.  HENT:     Head: Normocephalic and atraumatic.     Right Ear: Tympanic membrane and external ear normal.     Left Ear: Tympanic membrane and external ear normal.     Nose: Nose normal.     Mouth/Throat:     Mouth: Mucous membranes are moist.     Pharynx: Oropharynx is clear.  Eyes:     Conjunctiva/sclera: Conjunctivae normal.  Cardiovascular:     Rate and Rhythm: Normal rate and regular rhythm.     Heart sounds: Normal heart sounds. No murmur heard.    No friction rub. No gallop.  Pulmonary:     Effort: Pulmonary effort is normal.     Breath sounds: Normal breath sounds. No wheezing, rhonchi or rales.  Musculoskeletal:     Cervical back: Neck supple.  Skin:    General: Skin is warm.     Findings: No rash.  Neurological:     Mental Status: She is alert and oriented to person, place, and time.  Psychiatric:        Behavior: Behavior normal.    Previous notes and tests were reviewed. The plan was reviewed with the patient/family, and all questions/concerned were addressed.  It was my pleasure to see Tonya Lee today and participate in her care. Please feel free to contact me with any questions or concerns.  Sincerely,  Wyline Mood, DO Allergy & Immunology  [facility] office: [phone] [facility] office: [phone]

## 2021-09-30 ENCOUNTER — Telehealth: Payer: Self-pay

## 2021-09-30 ENCOUNTER — Other Ambulatory Visit: Payer: Self-pay

## 2021-09-30 ENCOUNTER — Ambulatory Visit
Admission: EM | Admit: 2021-09-30 | Discharge: 2021-09-30 | Disposition: A | Discharge status: Home / Self Care | Payer: BC Managed Care – PPO | Attending: Nurse Practitioner | Admitting: Nurse Practitioner

## 2021-09-30 ENCOUNTER — Encounter: Payer: Self-pay | Admitting: Emergency Medicine

## 2021-09-30 DIAGNOSIS — H6592 Unspecified nonsuppurative otitis media, left ear: Secondary | ICD-10-CM | POA: Diagnosis not present

## 2021-09-30 MED ORDER — FLUTICASONE PROPIONATE 50 MCG/ACT NA SUSP: 2.0000 | Freq: Every day | NASAL | 0 refills | Status: DC

## 2021-09-30 MED ORDER — AMOXICILLIN-POT CLAVULANATE 875-125 MG PO TABS: 1.0000 | ORAL_TABLET | Freq: Two times a day (BID) | ORAL | 0 refills | Status: DC

## 2021-09-30 MED ORDER — FEXOFENADINE-PSEUDOEPHED ER 60-120 MG PO TB12: 1.0000 | ORAL_TABLET | Freq: Two times a day (BID) | ORAL | 0 refills | Status: DC

## 2021-09-30 NOTE — Discharge Instructions (Addendum)
Take medication as prescribed.  I have prescribed Allegra D that has Sudafed in it.  You can take that instead of taking your plain Allegra while your symptoms persist. Warm compresses to the affected ear help with comfort. Do not stick anything inside the ear while symptoms persist. Avoid getting water inside of the ear while symptoms persist. Follow-up in this clinic or with your primary care physician if symptoms fail to improve.

## 2021-09-30 NOTE — ED Provider Notes (Signed)
[facility]    CSN: [id number] Arrival date & time: 09/30/21  1832      History   Chief Complaint Chief Complaint  Patient presents with   Ear Fullness    HPI Tonya Lee is a 36 y.o. female.   The history is provided by the patient.   Patient presents for complaints of left ear pain with decreased hearing and dizziness that started today.  Patient reports that she did have upper respiratory symptoms that started approximately 4 to 5 days prior.  She states today, the left ear pain has progressively worsened.  She states that she "cannot hear anything" out of the left ear.  She denies ear drainage, cough, shortness of breath, difficulty breathing, or headache.  She states that her symptoms are improving, but was concerned because her ear pain suddenly worsened today.  Patient states that she normally takes Allegra for her underlying history of seasonal allergies.  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[place])    GERD (gastroesophageal reflux disease)    Migraines    Peptic ulcer disease     Patient Active Problem List   Diagnosis Date Noted   Pruritic rash 08/14/2021   Other allergic rhinitis 08/14/2021   History of asthma 08/14/2021   Mild persistent asthma with acute exacerbation 07/07/2018   Migraine with aura and without status migrainosus, not intractable 10/11/2017   S/P right knee arthroscopy 04/19/2017   PUD (peptic ulcer disease) 04/18/2017   Colitis 04/16/2017   GERD (gastroesophageal reflux disease) 02/28/2015    Past Surgical History:  Procedure Laterality Date   ANKLE SURGERY Right july 2013   FASCIECTOMY Right    right lower leg   KNEE SURGERY      OB History     Gravida  1   Para      Term      Preterm      AB      Living         SAB      IAB      Ectopic      Multiple      Live Births               Home Medications    Prior to Admission medications    Medication Sig Start Date End Date Taking? Authorizing Provider  amoxicillin-clavulanate (AUGMENTIN) 875-125 MG tablet Take 1 tablet by mouth every 12 (twelve) hours. 09/30/21  Yes Christel Bai-Warren, Alda Lea, NP  fexofenadine-pseudoephedrine (ALLEGRA-D) 60-120 MG 12 hr tablet Take 1 tablet by mouth every 12 (twelve) hours. 09/30/21  Yes Rhodie Cienfuegos-Warren, Alda Lea, NP  fluticasone (FLONASE) 50 MCG/ACT nasal spray Place 2 sprays into both nostrils daily. 09/30/21  Yes Zakery Normington-Warren, Alda Lea, NP  cetirizine (ZYRTEC) 10 MG tablet Take 1 tablet (10 mg total) by mouth daily. 05/21/21   Sharion Balloon, FNP  diphenhydrAMINE (BENADRYL) 25 MG tablet Take 25 mg by mouth every 6 (six) hours as needed.    [provider]  famotidine (PEPCID) 20 MG tablet Take 1 tablet (20 mg total) by mouth 2 (two) times daily. 09/24/21   Garnet Sierras, DO  montelukast (SINGULAIR) 10 MG tablet Take 1 tablet (10 mg total) by mouth at bedtime. 09/24/21   Garnet Sierras, DO  Multiple Vitamin (MULTIVITAMIN ADULT PO) Multivitamin    [provider]    Family History Family History  Problem Relation Age of Onset   Asthma  Mother    Hyperlipidemia Maternal Grandmother    Hypertension Maternal Grandmother    Asthma Maternal Grandmother    Heart disease Maternal Grandfather    Cancer Maternal Grandfather        thyroid   Diabetes Maternal Grandfather    Heart disease Paternal Grandfather     Social History Social History   Tobacco Use   Smoking status: Never   Smokeless tobacco: Never  Vaping Use   Vaping Use: Never used  Substance Use Topics   Alcohol use: No   Drug use: No     Allergies   Sulfa antibiotics   Review of Systems Review of Systems Per HPI  Physical Exam Triage Vital Signs ED Triage Vitals [09/30/21 1856]  Enc Vitals Group     BP 123/78     Pulse Rate (!) 101     Resp 18     Temp 99.5 F (37.5 C)     Temp Source Oral     SpO2 99 %     Weight      Height      Head  Circumference      Peak Flow      Pain Score 7     Pain Loc      Pain Edu?      Excl. in GC?    No data found.  Updated Vital Signs BP 123/78 (BP Location: Right Arm)   Pulse (!) 101   Temp 99.5 F (37.5 C) (Oral)   Resp 18   SpO2 99%   Visual Acuity Right Eye Distance:   Left Eye Distance:   Bilateral Distance:    Right Eye Near:   Left Eye Near:    Bilateral Near:     Physical Exam Vitals and nursing note reviewed.  Constitutional:      General: She is not in acute distress.    Appearance: Normal appearance.  HENT:     Head: Normocephalic.     Right Ear: Tympanic membrane, ear canal and external ear normal.     Left Ear: Ear canal and external ear normal. Decreased hearing noted. A middle ear effusion is present. Tympanic membrane is erythematous and bulging.     Nose: Congestion and rhinorrhea present. Rhinorrhea is clear.     Right Turbinates: Enlarged and swollen.     Left Turbinates: Enlarged and swollen.     Right Sinus: No maxillary sinus tenderness or frontal sinus tenderness.     Left Sinus: No maxillary sinus tenderness or frontal sinus tenderness.     Mouth/Throat:     Lips: Pink.     Mouth: Mucous membranes are moist.     Pharynx: Uvula midline. Posterior oropharyngeal erythema present. No pharyngeal swelling, oropharyngeal exudate or uvula swelling.     Tonsils: No tonsillar exudate.  Eyes:     Extraocular Movements: Extraocular movements intact.     Conjunctiva/sclera: Conjunctivae normal.     Pupils: Pupils are equal, round, and reactive to light.  Cardiovascular:     Rate and Rhythm: Regular rhythm.     Heart sounds: Normal heart sounds.  Pulmonary:     Effort: Pulmonary effort is normal. No respiratory distress.     Breath sounds: Normal breath sounds. No stridor. No wheezing, rhonchi or rales.  Abdominal:     General: Bowel sounds are normal.     Palpations: Abdomen is soft.  Musculoskeletal:     Cervical back: Normal range of motion.   Lymphadenopathy:  Cervical: No cervical adenopathy.  Skin:    General: Skin is warm and dry.  Neurological:     General: No focal deficit present.     Mental Status: She is alert and oriented to person, place, and time.  Psychiatric:        Behavior: Behavior normal.      UC Treatments / Results  Labs (all labs ordered are listed, but only abnormal results are displayed) Labs Reviewed - No data to display  EKG   Radiology No results found.  Procedures Procedures (including critical care time)  Medications Ordered in UC Medications - No data to display  Initial Impression / Assessment and Plan / UC Course  I have reviewed the triage vital signs and the nursing notes.  Pertinent labs & imaging results that were available during my care of the patient were reviewed by me and considered in my medical decision making (see chart for details).  Patient presents for complaints of left ear pain with decreased hearing.  On exam, patient has a left ear effusion with bulging and erythema of the left tympanic membrane.  She also continues to experience upper respiratory symptoms to include nasal congestion, and runny nose.  Symptoms are consistent with a left otitis media with effusion.  We will treat patient with Augmentin.  We will also prescribe patient Allegra-D and fluticasone to help with her middle ear effusion and continued upper respiratory symptoms.  Supportive care recommendations were provided to the patient.  Patient verbalizes understanding.  Patient was advised to follow-up in this clinic or with her PCP if symptoms fail to improve. Final Clinical Impressions(s) / UC Diagnoses   Final diagnoses:  Left otitis media with effusion     Discharge Instructions      Take medication as prescribed.  I have prescribed Allegra D that has Sudafed in it.  You can take that instead of taking your plain Allegra while your symptoms persist. Warm compresses to the affected ear  help with comfort. Do not stick anything inside the ear while symptoms persist. Avoid getting water inside of the ear while symptoms persist. Follow-up in this clinic or with your primary care physician if symptoms fail to improve.     ED Prescriptions     Medication Sig Dispense Auth. Provider   fexofenadine-pseudoephedrine (ALLEGRA-D) 60-120 MG 12 hr tablet Take 1 tablet by mouth every 12 (twelve) hours. 30 tablet Maycie Luera-Warren, Alda Lea, NP   amoxicillin-clavulanate (AUGMENTIN) 875-125 MG tablet Take 1 tablet by mouth every 12 (twelve) hours. 14 tablet Ryan Ogborn-Warren, Alda Lea, NP   fluticasone (FLONASE) 50 MCG/ACT nasal spray Place 2 sprays into both nostrils daily. 16 g Jasime Westergren-Warren, Alda Lea, NP      PDMP not reviewed this encounter.   Tish Men, NP 09/30/21 1929

## 2021-09-30 NOTE — ED Triage Notes (Signed)
Pt reports nasal congestion since Friday and reports symptoms were improving but reports now has hearing loss in left ear and reports pain and dizziness with bending over.

## 2021-10-04 ENCOUNTER — Telehealth: Payer: Self-pay

## 2021-10-04 NOTE — Telephone Encounter (Signed)
Pt called and spoke with someone. Pt stated that she wasn't feeling better and was concerned of her diagnosis.   Pt was instructed that the provider wanted her to continue her treatment regimen and to follow up with her pcp if she was not any better after her course of antibiotic. LM

## 2021-10-07 ENCOUNTER — Ambulatory Visit: Payer: BC Managed Care – PPO | Admitting: Family Medicine

## 2021-10-08 ENCOUNTER — Ambulatory Visit: Payer: BC Managed Care – PPO | Admitting: Family Medicine

## 2021-10-08 ENCOUNTER — Ambulatory Visit (INDEPENDENT_AMBULATORY_CARE_PROVIDER_SITE_OTHER): Payer: BC Managed Care – PPO | Admitting: Family Medicine

## 2021-10-08 ENCOUNTER — Encounter: Payer: Self-pay | Admitting: Family Medicine

## 2021-10-08 VITALS — BP 104/71 | HR 63 | Temp 98.0°F | Ht 65.0 in | Wt 121.0 lb

## 2021-10-08 DIAGNOSIS — H938X2 Other specified disorders of left ear: Secondary | ICD-10-CM | POA: Diagnosis not present

## 2021-10-08 MED ORDER — PREDNISONE 20 MG PO TABS: ORAL_TABLET | ORAL | 0 refills | Status: DC

## 2021-10-08 NOTE — Progress Notes (Signed)
BP 104/71   Pulse 63   Temp 98 F (36.7 C)   Ht 5\' 5"  (1.651 m)   Wt 121 lb (54.9 kg)   SpO2 100%   BMI 20.14 kg/m    Subjective:   Patient ID: , female    DOB: 1985-04-26, 36 y.o.   MRN: [id number]  HPI: Tonya Lee is a 36 y.o. female presenting on 10/08/2021 for loss of hearing  (Left ear, recent ear infection)   HPI Ear congestion and hearing loss Patient is coming in for ear congestion and hearing loss especially in the left ear.  She has a little bit of congestion in the right ear as well but definitely muffled and hearing loss in the left ear.  She says this started with an ear infection for which she took an antibiotic and allergy medicine and is using Flonase and Allegra-D but her congestion in that ear is just not clearing up.  She says she gets the occasional twinges of pain but denies any fevers or chills.  Relevant past medical, surgical, family and social history reviewed and updated as indicated. Interim medical history since our last visit reviewed. Allergies and medications reviewed and updated.  Review of Systems  Constitutional:  Negative for chills and fever.  HENT:  Positive for congestion and hearing loss. Negative for ear discharge, ear pain, sinus pressure, sinus pain and sneezing.   Eyes:  Negative for redness and visual disturbance.  Respiratory:  Negative for cough, chest tightness and shortness of breath.   Cardiovascular:  Negative for chest pain and leg swelling.  Genitourinary:  Negative for difficulty urinating and dysuria.  Musculoskeletal:  Negative for back pain and gait problem.  Skin:  Negative for rash.  Neurological:  Negative for light-headedness and headaches.  Psychiatric/Behavioral:  Negative for agitation and behavioral problems.   All other systems reviewed and are negative.   Per HPI unless specifically indicated above   Allergies as of 10/08/2021       Reactions   Sulfa Antibiotics Anaphylaxis, Hives, Other  (See Comments)   Throat and face swelled        Medication List        Accurate as of October 08, 2021  8:39 AM. If you have any questions, ask your nurse or doctor.          STOP taking these medications    amoxicillin-clavulanate 875-125 MG tablet Commonly known as: AUGMENTIN Stopped by: October 10, 2021 Kenshin Splawn, MD   cetirizine 10 MG tablet Commonly known as: ZYRTEC Stopped by: Elige Radon, MD       TAKE these medications    diphenhydrAMINE 25 MG tablet Commonly known as: BENADRYL Take 25 mg by mouth every 6 (six) hours as needed.   famotidine 20 MG tablet Commonly known as: PEPCID Take 1 tablet (20 mg total) by mouth 2 (two) times daily.   fexofenadine-pseudoephedrine 60-120 MG 12 hr tablet Commonly known as: ALLEGRA-D Take 1 tablet by mouth every 12 (twelve) hours.   fluticasone 50 MCG/ACT nasal spray Commonly known as: FLONASE Place 2 sprays into both nostrils daily.   montelukast 10 MG tablet Commonly known as: Singulair Take 1 tablet (10 mg total) by mouth at bedtime.   MULTIVITAMIN ADULT PO Multivitamin   predniSONE 20 MG tablet Commonly known as: DELTASONE 2 po at same time daily for 5 days Started by: Nils Pyle, MD         Objective:   BP 104/71  Pulse 63   Temp 98 F (36.7 C)   Ht 5\' 5"  (1.651 m)   Wt 121 lb (54.9 kg)   SpO2 100%   BMI 20.14 kg/m   Wt Readings from Last 3 Encounters:  10/08/21 121 lb (54.9 kg)  08/13/21 124 lb (56.2 kg)  05/22/21 120 lb (54.4 kg)    Physical Exam Vitals and nursing note reviewed.  Constitutional:      General: She is not in acute distress.    Appearance: She is well-developed. She is not diaphoretic.  HENT:     Right Ear: No drainage or tenderness. A middle ear effusion is present. There is no impacted cerumen. Tympanic membrane is not injected, scarred, perforated, erythematous, retracted or bulging.     Left Ear: No drainage or tenderness. A middle ear effusion is  present. There is no impacted cerumen. Tympanic membrane is bulging. Tympanic membrane is not injected, scarred, perforated, erythematous or retracted.  Eyes:     Conjunctiva/sclera: Conjunctivae normal.  Cardiovascular:     Rate and Rhythm: Normal rate and regular rhythm.     Heart sounds: Normal heart sounds. No murmur heard. Pulmonary:     Effort: Pulmonary effort is normal. No respiratory distress.     Breath sounds: Normal breath sounds. No wheezing.  Musculoskeletal:        General: No tenderness. Normal range of motion.  Skin:    General: Skin is warm and dry.     Findings: No rash.  Neurological:     Mental Status: She is alert and oriented to person, place, and time.     Coordination: Coordination normal.  Psychiatric:        Behavior: Behavior normal.       Assessment & Plan:   Problem List Items Addressed This Visit   None Visit Diagnoses     Congestion of left ear    -  Primary   Relevant Medications   predniSONE (DELTASONE) 20 MG tablet       Progress of prednisone to see if we can get her ears cleared. Follow up plan: Return if symptoms worsen or fail to improve.  Counseling provided for all of the vaccine components No orders of the defined types were placed in this encounter.   Caryl Pina, MD [facility] 10/08/2021, 8:39 AM

## 2021-10-14 ENCOUNTER — Ambulatory Visit: Payer: BC Managed Care – PPO | Admitting: Nurse Practitioner

## 2021-10-14 ENCOUNTER — Encounter: Payer: Self-pay | Admitting: Nurse Practitioner

## 2021-10-14 VITALS — BP 119/78 | HR 88 | Temp 98.7°F | Wt 124.8 lb

## 2021-10-14 DIAGNOSIS — J029 Acute pharyngitis, unspecified: Secondary | ICD-10-CM | POA: Diagnosis not present

## 2021-10-14 DIAGNOSIS — H938X2 Other specified disorders of left ear: Secondary | ICD-10-CM | POA: Diagnosis not present

## 2021-10-14 LAB — CULTURE, GROUP A STREP

## 2021-10-14 LAB — RAPID STREP SCREEN (MED CTR MEBANE ONLY): Strep Gp A Ag, IA W/Reflex: NEGATIVE

## 2021-10-14 NOTE — Patient Instructions (Signed)
Sore Throat When you have a sore throat, your throat may feel: Tender. Burning. Irritated. Scratchy. Painful when you swallow. Painful when you talk. Many things can cause a sore throat, such as: An infection. Allergies. Dry air. Smoke or pollution. Radiation treatment for cancer. Gastroesophageal reflux disease (GERD). A tumor. A sore throat can be the first sign of another sickness. It can happen with other problems, like: Coughing. Sneezing. Fever. Swelling of the glands in the neck. Most sore throats go away without treatment. Follow these instructions at home:     Medicines Take over-the-counter and prescription medicines only as told by your doctor. Children often get sore throats. Do not give your child aspirin. Use throat sprays to soothe your throat as told by your health care provider. Managing pain To help with pain: Sip warm liquids, such as broth, herbal tea, or warm water. Eat or drink cold or frozen liquids, such as frozen ice pops. Rinse your mouth (gargle) with a salt water mixture 3-4 times a day or as needed. To make salt water, dissolve -1 tsp (3-6 g) of salt in 1 cup (237 mL) of warm water. Do not swallow this mixture. Suck on hard candy or throat lozenges. Put a cool-mist humidifier in your bedroom at night. Sit in the bathroom with the door closed for 5-10 minutes while you run hot water in the shower. General instructions Do not smoke or use any products that contain nicotine or tobacco. If you need help quitting, ask your doctor. Get plenty of rest. Drink enough fluid to keep your pee (urine) pale yellow. Wash your hands often for at least 20 seconds with soap and water. If soap and water are not available, use hand sanitizer. Contact a doctor if: You have a fever for more than 2-3 days. You keep having symptoms for more than 2-3 days. Your throat does not get better in 7 days. You have a fever and your symptoms suddenly get worse. Your  child who is 3 months to 3 years old has a temperature of 102.2F (39C) or higher. Get help right away if: You have trouble breathing. You cannot swallow fluids, soft foods, or your spit. You have swelling in your throat or neck that gets worse. You feel like you may vomit (nauseous) and this feeling lasts a long time. You cannot stop vomiting. These symptoms may be an emergency. Get help right away. Call your local emergency services (911 in the [place] Do not wait to see if the symptoms will go away. Do not drive yourself to the hospital. Summary A sore throat is a painful, burning, irritated, or scratchy throat. Many things can cause a sore throat. Take over-the-counter medicines only as told by your doctor. Get plenty of rest. Drink enough fluid to keep your pee (urine) pale yellow. Contact a doctor if your symptoms get worse or your sore throat does not get better within 7 days. This information is not intended to replace advice given to you by your health care provider. Make sure you discuss any questions you have with your health care provider. Document Revised: 03/26/2020 Document Reviewed: 03/26/2020 Elsevier Patient Education  2023 [employer]  

## 2021-10-14 NOTE — Progress Notes (Signed)
Acute Office Visit  Subjective:     Patient ID: Tonya Lee, female    DOB: 11/20/1985, 36 y.o.   MRN: [id number]  Chief Complaint  Patient presents with   Sore Throat   Ear Pain    Sore Throat  This is a new problem. The current episode started yesterday. The problem has been unchanged. Neither side of throat is experiencing more pain than the other. There has been no fever. The pain is mild. Associated symptoms include congestion and ear pain. Pertinent negatives include no headaches.   Review of Systems  Constitutional: Negative.   HENT:  Positive for congestion and ear pain.   Respiratory: Negative.    Cardiovascular: Negative.   Genitourinary: Negative.   Musculoskeletal: Negative.   Skin: Negative.   Neurological: Negative.  Negative for headaches.  All other systems reviewed and are negative.       Objective:    BP 119/78   Pulse 88   Temp 98.7 F (37.1 C)   Wt 124 lb 12.8 oz (56.6 kg)   SpO2 96%   BMI 20.77 kg/m  BP Readings from Last 3 Encounters:  10/14/21 119/78  10/08/21 104/71  09/30/21 123/78   Wt Readings from Last 3 Encounters:  10/14/21 124 lb 12.8 oz (56.6 kg)  10/08/21 121 lb (54.9 kg)  08/13/21 124 lb (56.2 kg)      Physical Exam Vitals reviewed.  Constitutional:      Appearance: Normal appearance. She is well-developed.  HENT:     Head: Normocephalic.     Right Ear: External ear normal.     Left Ear: External ear normal. Decreased hearing noted. Drainage and tenderness present. There is no impacted cerumen. No foreign body.     Nose: Congestion present.     Mouth/Throat:     Mouth: Mucous membranes are moist.     Pharynx: Oropharynx is clear.  Eyes:     Conjunctiva/sclera: Conjunctivae normal.  Cardiovascular:     Rate and Rhythm: Normal rate and regular rhythm.     Pulses: Normal pulses.     Heart sounds: Normal heart sounds.  Pulmonary:     Effort: Pulmonary effort is normal.     Breath sounds: Normal breath sounds.   Abdominal:     General: Bowel sounds are normal.     Palpations: Abdomen is soft.  Skin:    General: Skin is warm and dry.     Findings: No erythema.  Neurological:     General: No focal deficit present.     Mental Status: She is alert and oriented to person, place, and time.  Psychiatric:        Behavior: Behavior normal.     No results found for any visits on 10/14/21.      Assessment & Plan:  Unresolved ear congestion, sore throat and pain.  Patient completed antibiotic and prednisone with no resolution.  Completed referral to ENT.  Completed strep swab, COVID and RSV results pending.  Advised patient to:Take meds as prescribed - Use a cool mist humidifier  -Use saline nose sprays frequently -Force fluids -For fever or aches or pains- take Tylenol or ibuprofen.  Follow up with worsening unresolved symptoms  Problem List Items Addressed This Visit   None Visit Diagnoses     Sore throat    -  Primary   Relevant Orders   Rapid Strep Screen ([facility] ONLY)   COVID-19, Flu A+B and RSV   Congestion of left  ear       Relevant Orders   Ambulatory referral to ENT       No orders of the defined types were placed in this encounter.   No follow-ups on file.  Daryll Drown, NP

## 2021-10-15 LAB — COVID-19, FLU A+B AND RSV
Influenza A, NAA: NOT DETECTED
Influenza B, NAA: NOT DETECTED
RSV, NAA: NOT DETECTED
SARS-CoV-2, NAA: NOT DETECTED

## 2021-10-23 ENCOUNTER — Ambulatory Visit: Payer: BC Managed Care – PPO | Admitting: Family Medicine

## 2021-10-27 ENCOUNTER — Encounter: Payer: Self-pay | Admitting: Family Medicine

## 2021-10-30 ENCOUNTER — Ambulatory Visit (INDEPENDENT_AMBULATORY_CARE_PROVIDER_SITE_OTHER): Payer: BC Managed Care – PPO | Admitting: Nurse Practitioner

## 2021-10-30 ENCOUNTER — Encounter: Payer: Self-pay | Admitting: Nurse Practitioner

## 2021-10-30 DIAGNOSIS — J020 Streptococcal pharyngitis: Secondary | ICD-10-CM | POA: Diagnosis not present

## 2021-10-30 MED ORDER — AMOXICILLIN 875 MG PO TABS: 875.0000 mg | ORAL_TABLET | Freq: Two times a day (BID) | ORAL | 0 refills | Status: DC

## 2021-10-30 NOTE — Progress Notes (Signed)
   Virtual Visit  Note Due to COVID-19 pandemic this visit was conducted virtually. This visit type was conducted due to national recommendations for restrictions regarding the COVID-19 Pandemic (e.g. social distancing, sheltering in place) in an effort to limit this patient's exposure and mitigate transmission in our community. All issues noted in this document were discussed and addressed.  A physical exam was not performed with this format.  I connected with Tonya Lee on 10/30/21 at 4:32 by telephone and verified that I am speaking with the correct person using two identifiers. Tonya Lee is currently located at home and no one is currently with her during visit. The provider, Mary-Margaret Hassell Done, FNP is located in their office at time of visit.  I discussed the limitations, risks, security and privacy concerns of performing an evaluation and management service by telephone and the availability of in person appointments. I also discussed with the patient that there may be a patient responsible charge related to this service. The patient expressed understanding and agreed to proceed.   History and Present Illness:  Husband and kids were all treated for strep this week.  Sore Throat  This is a new problem. The current episode started yesterday. The problem has been gradually worsening. Neither side of throat is experiencing more pain than the other. There has been no fever. The pain is at a severity of 8/10. The pain is moderate. Associated symptoms include coughing, ear pain, swollen glands and trouble swallowing. Pertinent negatives include no congestion. She has had exposure to strep. She has tried acetaminophen (allegra D) for the symptoms. The treatment provided mild relief.      Review of Systems  HENT:  Positive for ear pain and trouble swallowing. Negative for congestion.   Respiratory:  Positive for cough.      Observations/Objective: Alert and oriented- answers all  questions appropriately No distress    Assessment and Plan: Tonya Lee in today with chief complaint of Sore Throat   1. Strep pharyngitis Force fluids Motrin or tylenol OTC OTC decongestant Throat lozenges if help New toothbrush in 3 days  Meds ordered this encounter  Medications   amoxicillin (AMOXIL) 875 MG tablet    Sig: Take 1 tablet (875 mg total) by mouth 2 (two) times daily. 1 po BID    Dispense:  20 tablet    Refill:  0    Order Specific Question:   Supervising Provider    Answer:   Caryl Pina A [[id number]]      Follow Up Instructions: prn    I discussed the assessment and treatment plan with the patient. The patient was provided an opportunity to ask questions and all were answered. The patient agreed with the plan and demonstrated an understanding of the instructions.   The patient was advised to call back or seek an in-person evaluation if the symptoms worsen or if the condition fails to improve as anticipated.  The above assessment and management plan was discussed with the patient. The patient verbalized understanding of and has agreed to the management plan. Patient is aware to call the clinic if symptoms persist or worsen. Patient is aware when to return to the clinic for a follow-up visit. Patient educated on when it is appropriate to go to the emergency department.   Time call ended: 4:45   I provided 13 minutes of  non face-to-face time during this encounter.    Mary-Margaret Hassell Done, FNP

## 2021-10-30 NOTE — Patient Instructions (Signed)
Force fluids °Motrin or tylenol OTC °OTC decongestant °Throat lozenges if help °New toothbrush in 3 days ° °

## 2021-11-05 ENCOUNTER — Ambulatory Visit: Payer: BC Managed Care – PPO | Admitting: Allergy

## 2021-12-19 ENCOUNTER — Emergency Department (HOSPITAL_BASED_OUTPATIENT_CLINIC_OR_DEPARTMENT_OTHER): Payer: BC Managed Care – PPO

## 2021-12-19 ENCOUNTER — Other Ambulatory Visit: Payer: Self-pay | Admitting: Allergy

## 2021-12-19 ENCOUNTER — Emergency Department (HOSPITAL_BASED_OUTPATIENT_CLINIC_OR_DEPARTMENT_OTHER): Payer: BC Managed Care – PPO | Admitting: Radiology

## 2021-12-19 ENCOUNTER — Encounter (HOSPITAL_BASED_OUTPATIENT_CLINIC_OR_DEPARTMENT_OTHER): Payer: Self-pay | Admitting: Emergency Medicine

## 2021-12-19 ENCOUNTER — Other Ambulatory Visit: Payer: Self-pay

## 2021-12-19 ENCOUNTER — Emergency Department (HOSPITAL_BASED_OUTPATIENT_CLINIC_OR_DEPARTMENT_OTHER)
Admission: EM | Admit: 2021-12-19 | Discharge: 2021-12-19 | Disposition: A | Discharge status: Home / Self Care | Payer: BC Managed Care – PPO | Attending: Emergency Medicine | Admitting: Emergency Medicine

## 2021-12-19 DIAGNOSIS — R42 Dizziness and giddiness: Secondary | ICD-10-CM

## 2021-12-19 DIAGNOSIS — R072 Precordial pain: Secondary | ICD-10-CM

## 2021-12-19 DIAGNOSIS — M79604 Pain in right leg: Secondary | ICD-10-CM | POA: Diagnosis not present

## 2021-12-19 LAB — TROPONIN I (HIGH SENSITIVITY)
Troponin I (High Sensitivity): <2 ng/L (ref <18)
Troponin I (High Sensitivity): <2 ng/L (ref <18)

## 2021-12-19 LAB — BASIC METABOLIC PANEL
Anion gap: 12 (ref 5–15)
BUN: 11 mg/dL (ref 6–20)
CO2: 26 mmol/L (ref 22–32)
Calcium: 10.8 mg/dL — ABNORMAL HIGH (ref 8.9–10.3)
Chloride: 100 mmol/L (ref 98–111)
Creatinine, Ser: 0.7 mg/dL (ref 0.44–1.00)
GFR, Estimated: >60 mL/min (ref >60)
Glucose, Bld: 91 mg/dL (ref 70–99)
Potassium: 3.7 mmol/L (ref 3.5–5.1)
Sodium: 138 mmol/L (ref 135–145)

## 2021-12-19 LAB — CBC
HCT: 44.7 % (ref 36.0–46.0)
Hemoglobin: 14.8 g/dL (ref 12.0–15.0)
MCH: 28.9 pg (ref 26.0–34.0)
MCHC: 33.1 g/dL (ref 30.0–36.0)
MCV: 87.3 fL (ref 80.0–100.0)
Platelets: 351 10*3/uL (ref 150–400)
RBC: 5.12 MIL/uL — ABNORMAL HIGH (ref 3.87–5.11)
RDW: 12.6 % (ref 11.5–15.5)
WBC: 5.8 10*3/uL (ref 4.0–10.5)
nRBC: 0 % (ref 0.0–0.2)

## 2021-12-19 LAB — PREGNANCY, URINE: Preg Test, Ur: NEGATIVE

## 2021-12-19 MED ORDER — ALUM & MAG HYDROXIDE-SIMETH 200-200-20 MG/5ML PO SUSP
30.0000 mL | Freq: Once | ORAL | Status: AC
  Administered 2021-12-19: 30 mL via ORAL
  Filled 2021-12-19: qty 30

## 2021-12-19 MED ORDER — LIDOCAINE VISCOUS HCL 2 % MT SOLN
15.0000 mL | Freq: Once | OROMUCOSAL | Status: AC
  Administered 2021-12-19: 15 mL via ORAL
  Filled 2021-12-19: qty 15

## 2021-12-19 MED ORDER — IOHEXOL 350 MG/ML SOLN
100.0000 mL | Freq: Once | INTRAVENOUS | Status: AC | PRN
  Administered 2021-12-19: 68 mL via INTRAVENOUS

## 2021-12-19 NOTE — ED Provider Notes (Signed)
[facility]   CSN: [id number] Arrival date & time: 12/19/21  1625     History  Chief Complaint  Patient presents with   Chest Pain    Tonya Lee is a 36 y.o. female past medical history significant for GERD, migraine, multiple right lower extremity surgeries here for evaluation of chest pain.  Noted over the last few days she has had some intermittent dizzy spells.  Pain today described as sharp.  Nonexertional, nonpleuritic in nature.  Has also noted some right leg pain which began yesterday.  She made appointment with her orthopedist who did her prior knee surgeries.  Today when her chest pain started she was seen by urgent care who was concerned for PE or DVT subsequently sent here.  No sudden onset 9:00 headache.  No syncope.  No vision changes, numbness or weakness.  No back pain.  No history of PE or DVT, recent surgery, immobilization or malignancy  HPI     Home Medications Prior to Admission medications   Medication Sig Start Date End Date Taking? Authorizing Provider  amoxicillin (AMOXIL) 875 MG tablet Take 1 tablet (875 mg total) by mouth 2 (two) times daily. 1 po BID 10/30/21   Daphine DeutscherMartin, Mary-Margaret, FNP  diphenhydrAMINE (BENADRYL) 25 MG tablet Take 25 mg by mouth every 6 (six) hours as needed.    [provider]  famotidine (PEPCID) 20 MG tablet Take 1 tablet (20 mg total) by mouth 2 (two) times daily. 09/24/21   Ellamae SiaKim, Yoon M, DO  fexofenadine-pseudoephedrine (ALLEGRA-D) 60-120 MG 12 hr tablet Take 1 tablet by mouth every 12 (twelve) hours. 09/30/21   Leath-Warren, Sadie Haberhristie J, NP  fluticasone (FLONASE) 50 MCG/ACT nasal spray Place 2 sprays into both nostrils daily. 09/30/21   Leath-Warren, Sadie Haberhristie J, NP  montelukast (SINGULAIR) 10 MG tablet Take 1 tablet (10 mg total) by mouth at bedtime. 09/24/21   Ellamae SiaKim, Yoon M, DO  Multiple Vitamin (MULTIVITAMIN ADULT PO) Multivitamin    [provider]  predniSONE (DELTASONE) 20  MG tablet 2 po at same time daily for 5 days 10/08/21   Dettinger, Elige RadonJoshua A, MD      Allergies    Sulfa antibiotics    Review of Systems   Review of Systems  Constitutional: Negative.   HENT: Negative.    Respiratory:  Negative for apnea, cough, choking, chest tightness, shortness of breath, wheezing and stridor.   Cardiovascular:  Positive for chest pain and leg swelling (right leg).  Gastrointestinal: Negative.   Genitourinary: Negative.   Musculoskeletal: Negative.   Skin: Negative.   Neurological: Negative.   All other systems reviewed and are negative.   Physical Exam Updated Vital Signs BP 110/84   Pulse 77   Temp 98.4 F (36.9 C) (Oral)   Resp 13   SpO2 100%  Physical Exam Vitals and nursing note reviewed.  Constitutional:      General: She is not in acute distress.    Appearance: She is well-developed. She is not ill-appearing, toxic-appearing or diaphoretic.  HENT:     Head: Normocephalic and atraumatic.  Eyes:     Pupils: Pupils are equal, round, and reactive to light.  Cardiovascular:     Rate and Rhythm: Normal rate and regular rhythm.     Pulses:          Radial pulses are 2+ on the right side and 2+ on the left side.     Heart sounds: Normal heart sounds.  Pulmonary:  Effort: Pulmonary effort is normal. No respiratory distress.     Breath sounds: Normal breath sounds.  Abdominal:     General: Bowel sounds are normal. There is no distension or abdominal bruit.     Palpations: Abdomen is soft. There is no fluid wave, hepatomegaly, splenomegaly or mass.     Tenderness: There is no abdominal tenderness.  Musculoskeletal:        General: Normal range of motion.     Cervical back: Normal range of motion and neck supple.     Right lower leg: Tenderness present. No edema.     Left lower leg: No tenderness. No edema.  Skin:    General: Skin is warm and dry.     Capillary Refill: Capillary refill takes less than 2 seconds.  Neurological:     General: No  focal deficit present.     Mental Status: She is alert and oriented to person, place, and time.     ED Results / Procedures / Treatments   Labs (all labs ordered are listed, but only abnormal results are displayed) Labs Reviewed  BASIC METABOLIC PANEL - Abnormal; Notable for the following components:      Result Value   Calcium 10.8 (*)    All other components within normal limits  CBC - Abnormal; Notable for the following components:   RBC 5.12 (*)    All other components within normal limits  PREGNANCY, URINE  TROPONIN I (HIGH SENSITIVITY)  TROPONIN I (HIGH SENSITIVITY)    EKG EKG Interpretation  Date/Time:  Saturday December 19 2021 16:46:13 EST Ventricular Rate:  84 PR Interval:  124 QRS Duration: 86 QT Interval:  366 QTC Calculation: 432 R Axis:   85 Text Interpretation: Normal sinus rhythm Normal ECG No previous ECGs available Confirmed by Ernie Avena ([id number]) on 12/19/2021 6:13:48 PM  Radiology CT Angio Chest PE W and/or Wo Contrast  Result Date: 12/19/2021 CLINICAL DATA:  PE suspected. EXAM: CT ANGIOGRAPHY CHEST WITH CONTRAST TECHNIQUE: Multidetector CT imaging of the chest was performed using the standard protocol during bolus administration of intravenous contrast. Multiplanar CT image reconstructions and MIPs were obtained to evaluate the vascular anatomy. RADIATION DOSE REDUCTION: This exam was performed according to the departmental dose-optimization program which includes automated exposure control, adjustment of the mA and/or kV according to patient size and/or use of iterative reconstruction technique. CONTRAST:  52mL OMNIPAQUE IOHEXOL 350 MG/ML SOLN COMPARISON:  None Available. FINDINGS: Cardiovascular: Satisfactory opacification of the pulmonary arteries to the segmental level. No evidence of pulmonary embolism. Normal heart size. No pericardial effusion. Mediastinum/Nodes: No enlarged mediastinal, hilar, or axillary lymph nodes. Thyroid gland, trachea, and  esophagus demonstrate no significant findings. Lungs/Pleura: Lungs are clear. No pleural effusion or pneumothorax. Upper Abdomen: No acute abnormality. Musculoskeletal: No chest wall abnormality. No acute or significant osseous findings. Review of the MIP images confirms the above findings. IMPRESSION: No evidence of pulmonary embolism Electronically Signed   By: Lorenza Cambridge M.D.   On: 12/19/2021 18:56   US Venous Img Lower Unilateral Right  Result Date: 12/19/2021 CLINICAL DATA:  Complains of right medial knee pain for 1-2 weeks. EXAM: Right LOWER EXTREMITY VENOUS DOPPLER ULTRASOUND TECHNIQUE: Gray-scale sonography with compression, as well as color and duplex ultrasound, were performed to evaluate the deep venous system(s) from the level of the common femoral vein through the popliteal and proximal calf veins. COMPARISON:  07/05/2014 FINDINGS: VENOUS Normal compressibility of the common femoral, superficial femoral, and popliteal veins, as well as the  visualized calf veins. Visualized portions of profunda femoral vein and great saphenous vein unremarkable. No filling defects to suggest DVT on grayscale or color Doppler imaging. Doppler waveforms show normal direction of venous flow, normal respiratory plasticity and response to augmentation. Limited views of the contralateral common femoral vein are unremarkable. OTHER None. Limitations: none IMPRESSION: Negative. Electronically Signed   By: Signa Kell M.D.   On: 12/19/2021 18:04   DG Chest 2 View  Result Date: 12/19/2021 CLINICAL DATA:  Chest pain EXAM: CHEST - 2 VIEW COMPARISON:  None Available. FINDINGS: The heart size and mediastinal contours are within normal limits. Both lungs are clear. The visualized skeletal structures are unremarkable. IMPRESSION: No active cardiopulmonary disease. Electronically Signed   By: Ernie Avena M.D.   On: 12/19/2021 17:12    Procedures Procedures    Medications Ordered in ED Medications  alum & mag  hydroxide-simeth (MAALOX/MYLANTA) 200-200-20 MG/5ML suspension 30 mL (30 mLs Oral Given 12/19/21 1817)    And  lidocaine (XYLOCAINE) 2 % viscous mouth solution 15 mL (15 mLs Oral Given 12/19/21 1817)  iohexol (OMNIPAQUE) 350 MG/ML injection 100 mL (68 mLs Intravenous Contrast Given 12/19/21 1837)   ED Course/ Medical Decision Making/ A&P    36 year old here for evaluation of dizziness and chest pain.  Few episodes of dizziness over the last few days.  No "sudden onset" headache.  No numbness or weakness.  Noted yesterday she had some pain to her right leg.  No recent surgery, immobilization malignancy however said many surgeries to that leg.  Today developed chest pain.  Was seen by urgent care who was concerned for PE or DVT, sent here for further evaluation.  On my evaluation patient appears clinical well-hydrated.  She has full range of motion to her joints.  Compartments are soft.  Her abdomen soft, nontender.  Will plan on labs, imaging and reassess  Labs and imaging personally viewed and interpreted:  CBC without leukocytosis Metabolic panel without significant amount and delta troponin flat Pregnancy test negative Chest x-ray without cardiomegaly, pulm edema, pneumothorax Ultrasound negative for DVT EKG without ischemic changes CTA negative for PE  Reassessed.  Discussed her labs and imaging.  Unclear etiology of symptoms.  Low suspicion for acute ACS, PE, dissection, SAH, ICH, mass, infectious process, arrhythmia.  Have her follow-up outpatient.  The patient has been appropriately medically screened and/or stabilized in the ED. I have low suspicion for any other emergent medical condition which would require further screening, evaluation or treatment in the ED or require inpatient management.  Patient is hemodynamically stable and in no acute distress.  Patient able to ambulate in department prior to ED.  Evaluation does not show acute pathology that would require ongoing or additional  emergent interventions while in the emergency department or further inpatient treatment.  I have discussed the diagnosis with the patient and answered all questions.  Pain is been managed while in the emergency department and patient has no further complaints prior to discharge.  Patient is comfortable with plan discussed in room and is stable for discharge at this time.  I have discussed strict return precautions for returning to the emergency department.  Patient was encouraged to follow-up with PCP/specialist refer to at discharge.                           Medical Decision Making Amount and/or Complexity of Data Reviewed Independent Historian:     Details: family External Data  Reviewed: labs, radiology, ECG and notes. Labs: ordered. Decision-making details documented in ED Course. Radiology: ordered and independent interpretation performed. Decision-making details documented in ED Course. ECG/medicine tests: ordered and independent interpretation performed. Decision-making details documented in ED Course.  Risk OTC drugs. Prescription drug management. Parenteral controlled substances. Decision regarding hospitalization. Diagnosis or treatment significantly limited by social determinants of health.         Final Clinical Impression(s) / ED Diagnoses Final diagnoses:  Dizziness  Precordial pain    Rx / DC Orders ED Discharge Orders     None         Tearra Ouk A, PA-C 12/19/21 2038    Ernie Avena, MD 12/20/21 0013

## 2021-12-19 NOTE — Discharge Instructions (Addendum)
Follow-up outpatient. ° °Return for new or worsening symptoms. °

## 2021-12-19 NOTE — ED Triage Notes (Signed)
Pt presents to ED Pov. Pt c/o CP that began today. Pt reports that it's sharp/stabbing. Reports worse with respirations. Sent here from [facility] for PE r/o. No cardiac hx.

## 2021-12-20 ENCOUNTER — Other Ambulatory Visit: Payer: Self-pay | Admitting: Allergy

## 2021-12-21 ENCOUNTER — Encounter: Payer: Self-pay | Admitting: Family Medicine

## 2021-12-21 ENCOUNTER — Telehealth: Payer: Self-pay | Admitting: Family Medicine

## 2021-12-21 NOTE — Telephone Encounter (Signed)
Pt scheduled for 12/13/21. Appt canceled with Rakes.

## 2021-12-21 NOTE — Telephone Encounter (Signed)
Pt says she was told by Dr Dettinger to call office and ask to speak with his nurse to get scheduled for hospital follow up with him.

## 2021-12-22 ENCOUNTER — Encounter: Payer: Self-pay | Admitting: Family

## 2021-12-22 ENCOUNTER — Ambulatory Visit: Payer: BC Managed Care – PPO | Admitting: Family Medicine

## 2021-12-22 ENCOUNTER — Ambulatory Visit (INDEPENDENT_AMBULATORY_CARE_PROVIDER_SITE_OTHER): Payer: BC Managed Care – PPO | Admitting: Family

## 2021-12-22 VITALS — BP 134/84 | HR 82 | Temp 98.2°F | Ht 65.0 in | Wt 124.0 lb

## 2021-12-22 DIAGNOSIS — H8110 Benign paroxysmal vertigo, unspecified ear: Secondary | ICD-10-CM

## 2021-12-22 DIAGNOSIS — Z09 Encounter for follow-up examination after completed treatment for conditions other than malignant neoplasm: Secondary | ICD-10-CM

## 2021-12-22 MED ORDER — ONDANSETRON HCL 4 MG PO TABS: 4.0000 mg | ORAL_TABLET | Freq: Three times a day (TID) | ORAL | 0 refills | Status: DC | PRN

## 2021-12-22 MED ORDER — MECLIZINE HCL 25 MG PO TABS: 25.0000 mg | ORAL_TABLET | Freq: Three times a day (TID) | ORAL | 2 refills | Status: DC | PRN

## 2021-12-22 NOTE — Progress Notes (Signed)
Subjective:    Patient ID: Tonya Lee, female    DOB: [birth year], 36 y.o.   MRN: [id number]  Chief Complaint  Patient presents with   Hospitalization Follow-up   PT presents to the office today for hospital follow up. She went to the ED on 12/19/21 with dizziness and chest pain.   She had a CT angio chest negative, Doppler negative.   Dizziness is worse when she bends or makes position changes.   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  Dizziness This is a new problem. The current episode started 1 to 4 weeks ago. The problem occurs intermittently. The problem has been waxing and waning. Associated symptoms include chest pain.      Review of Systems  Cardiovascular:  Positive for chest pain.  Neurological:  Positive for dizziness.  All other systems reviewed and are negative.      Objective:   Physical Exam Vitals reviewed.  Constitutional:      General: She is not in acute distress.    Appearance: She is well-developed.  HENT:     Head: Normocephalic and atraumatic.     Right Ear: Tympanic membrane normal.     Left Ear: Tympanic membrane normal.  Eyes:     Pupils: Pupils are equal, round, and reactive to light.  Neck:     Thyroid: No thyromegaly.  Cardiovascular:     Rate and Rhythm: Normal rate and regular rhythm.     Heart sounds: Normal heart sounds. No murmur heard. Pulmonary:     Effort: Pulmonary effort is normal. No respiratory distress.     Breath sounds: Normal breath sounds. No wheezing.  Abdominal:     General: Bowel sounds are normal. There is no distension.     Palpations: Abdomen is soft.     Tenderness: There is no abdominal tenderness.  Musculoskeletal:        General: No tenderness. Normal range of motion.     Cervical back: Normal range of motion and neck supple.  Skin:    General: Skin is warm and dry.  Neurological:     Mental Status: She is alert and oriented to person, place, and time.     Cranial Nerves: No cranial nerve  deficit.     Deep Tendon Reflexes: Reflexes are normal and symmetric.  Psychiatric:        Behavior: Behavior normal.        Thought Content: Thought content normal.        Judgment: Judgment normal.       BP 134/84   Pulse 82   Temp 98.2 F (36.8 C) (Temporal)   Ht 5\' 5"  (1.651 m)   Wt 124 lb (56.2 kg)   BMI 20.63 kg/m      Assessment & Plan:  Tonya Lee comes in today with chief complaint of Hospitalization Follow-up   Diagnosis and orders addressed:  1. Benign paroxysmal positional vertigo, unspecified laterality Epley exercise, handout given  Antivert 25 mg TID prn  Fall preventions  Zofran as needed  Continue allegra and Flonase Start nasal decongestant.  Follow up if symptoms worsen or do not improve  - meclizine (ANTIVERT) 25 MG tablet; Take 1 tablet (25 mg total) by mouth 3 (three) times daily as needed for dizziness.  Dispense: 60 tablet; Refill: 2 - ondansetron (ZOFRAN) 4 MG tablet; Take 1 tablet (4 mg total) by mouth every 8 (eight) hours as needed for nausea or vomiting.  Dispense: 20 tablet; Refill: 0  2. Hospital discharge follow-up Hospital notes reviewed   Jannifer Rodney, FNP

## 2021-12-22 NOTE — Patient Instructions (Signed)
Benign Positional Vertigo Vertigo is the feeling that you or your surroundings are moving when they are not. Benign positional vertigo is the most common form of vertigo. This is usually a harmless condition (benign). This condition is positional. This means that symptoms are triggered by certain movements and positions. This condition can be dangerous if it occurs while you are doing something that could cause harm to yourself or others. This includes activities such as driving or operating machinery. What are the causes? The inner ear has fluid-filled canals that help your brain sense movement and balance. When the fluid moves, the brain receives messages about your body's position. With benign positional vertigo, calcium crystals in the inner ear break free and disturb the inner ear area. This causes your brain to receive confusing messages about your body's position. What increases the risk? You are more likely to develop this condition if: You are a woman. You are 35 years of age or older. You have recently had a head injury. You have an inner ear disease. What are the signs or symptoms? Symptoms of this condition usually happen when you move your head or your eyes in different directions. Symptoms may start suddenly and usually last for less than a minute. They include: Loss of balance and falling. Feeling like you are spinning or moving. Feeling like your surroundings are spinning or moving. Nausea and vomiting. Blurred vision. Dizziness. Involuntary eye movement (nystagmus). Symptoms can be mild and cause only minor problems, or they can be severe and interfere with daily life. Episodes of benign positional vertigo may return (recur) over time. Symptoms may also improve over time. How is this diagnosed? This condition may be diagnosed based on: Your medical history. A physical exam of the head, neck, and ears. Positional tests to check for or stimulate vertigo. You may be asked to  turn your head and change positions, such as going from sitting to lying down. A health care provider will watch for symptoms of vertigo. You may be referred to a health care provider who specializes in ear, nose, and throat problems (ENT or otolaryngologist) or a provider who specializes in disorders of the nervous system (neurologist). How is this treated?  This condition may be treated in a session in which your health care provider moves your head in specific positions to help the displaced crystals in your inner ear move. Treatment for this condition may take several sessions. Surgery may be needed in severe cases, but this is rare. In some cases, benign positional vertigo may resolve on its own in 2-4 weeks. Follow these instructions at home: Safety Move slowly. Avoid sudden body or head movements or certain positions, as told by your health care provider. Avoid driving or operating machinery until your health care provider says it is safe. Avoid doing any tasks that would be dangerous to you or others if vertigo occurs. If you have trouble walking or keeping your balance, try using a cane for stability. If you feel dizzy or unstable, sit down right away. Return to your normal activities as told by your health care provider. Ask your health care provider what activities are safe for you. General instructions Take over-the-counter and prescription medicines only as told by your health care provider. Drink enough fluid to keep your urine pale yellow. Keep all follow-up visits. This is important. Contact a health care provider if: You have a fever. Your condition gets worse or you develop new symptoms. Your family or friends notice any behavioral changes.   You have nausea or vomiting that gets worse. You have numbness or a prickling and tingling sensation. Get help right away if you: Have difficulty speaking or moving. Are always dizzy or faint. Develop severe headaches. Have weakness in  your legs or arms. Have changes in your hearing or vision. Develop a stiff neck. Develop sensitivity to light. These symptoms may represent a serious problem that is an emergency. Do not wait to see if the symptoms will go away. Get medical help right away. Call your local emergency services (911 in the [place] Do not drive yourself to the hospital. Summary Vertigo is the feeling that you or your surroundings are moving when they are not. Benign positional vertigo is the most common form of vertigo. This condition is caused by calcium crystals in the inner ear that become displaced. This causes a disturbance in an area of the inner ear that helps your brain sense movement and balance. Symptoms include loss of balance and falling, feeling that you or your surroundings are moving, nausea and vomiting, and blurred vision. This condition can be diagnosed based on symptoms, a physical exam, and positional tests. Follow safety instructions as told by your health care provider and keep all follow-up visits. This is important. This information is not intended to replace advice given to you by your health care provider. Make sure you discuss any questions you have with your health care provider. Document Revised: 11/28/2019 Document Reviewed: 11/28/2019 Elsevier Patient Education  2023 Elsevier Inc.  

## 2021-12-23 ENCOUNTER — Inpatient Hospital Stay: Payer: BC Managed Care – PPO | Admitting: Family Medicine

## 2021-12-23 ENCOUNTER — Telehealth: Payer: Self-pay | Admitting: Family Medicine

## 2021-12-23 NOTE — Telephone Encounter (Signed)
Spoke with patient. She feels that Christy's diagnosis is appropriate and that she will give herself more time for vertigo  improve and complete the exercises that Christy recommended. She will call back for an appt if needed.

## 2022-02-25 ENCOUNTER — Other Ambulatory Visit: Payer: Self-pay

## 2022-02-25 ENCOUNTER — Ambulatory Visit
Admission: EM | Admit: 2022-02-25 | Discharge: 2022-02-25 | Disposition: A | Discharge status: Home / Self Care | Payer: BC Managed Care – PPO | Attending: Family Medicine | Admitting: Family Medicine

## 2022-02-25 ENCOUNTER — Encounter: Payer: Self-pay | Admitting: Emergency Medicine

## 2022-02-25 DIAGNOSIS — M545 Low back pain, unspecified: Secondary | ICD-10-CM | POA: Diagnosis present

## 2022-02-25 DIAGNOSIS — Z113 Encounter for screening for infections with a predominantly sexual mode of transmission: Secondary | ICD-10-CM | POA: Insufficient documentation

## 2022-02-25 DIAGNOSIS — R102 Pelvic and perineal pain: Secondary | ICD-10-CM | POA: Insufficient documentation

## 2022-02-25 DIAGNOSIS — R350 Frequency of micturition: Secondary | ICD-10-CM | POA: Diagnosis not present

## 2022-02-25 LAB — POCT URINALYSIS DIP (MANUAL ENTRY)
Bilirubin, UA: NEGATIVE
Blood, UA: NEGATIVE
Glucose, UA: NEGATIVE mg/dL
Ketones, POC UA: NEGATIVE mg/dL
Leukocytes, UA: NEGATIVE
Nitrite, UA: NEGATIVE
Protein Ur, POC: NEGATIVE mg/dL
Spec Grav, UA: >=1.03 — AB (ref 1.010–1.025)
Urobilinogen, UA: 0.2 E.U./dL
pH, UA: 6 (ref 5.0–8.0)

## 2022-02-25 LAB — POCT URINE PREGNANCY: Preg Test, Ur: NEGATIVE

## 2022-02-25 MED ORDER — KETOROLAC TROMETHAMINE 30 MG/ML IJ SOLN
30.0000 mg | Freq: Once | INTRAMUSCULAR | Status: AC
  Administered 2022-02-25: 30 mg via INTRAMUSCULAR

## 2022-02-25 NOTE — Discharge Instructions (Signed)
Your urinalysis today and urine pregnancy test were both negative for abnormalities.  We have sent out a vaginal swab just to make sure and rule out other possible infections causing symptoms.  We have also given you a shot of Toradol today for pain and inflammation, this should help for the next 2 days.  You may also use a heating pad, drink plenty of water and follow-up with your primary care provider for a recheck

## 2022-02-25 NOTE — ED Triage Notes (Signed)
Pt reports lower back pain that radiates around to front of abdomen, nausea, urinary frequency, decreased output. Denies fever but reports nausea today.

## 2022-02-26 ENCOUNTER — Telehealth: Payer: BC Managed Care – PPO | Admitting: Physician Assistant

## 2022-02-26 DIAGNOSIS — M545 Low back pain, unspecified: Secondary | ICD-10-CM

## 2022-02-26 LAB — CERVICOVAGINAL ANCILLARY ONLY
Bacterial Vaginitis (gardnerella): NEGATIVE
Candida Glabrata: NEGATIVE
Candida Vaginitis: NEGATIVE
Chlamydia: NEGATIVE
Comment: NEGATIVE
Comment: NEGATIVE
Comment: NEGATIVE
Comment: NEGATIVE
Comment: NEGATIVE
Comment: NORMAL
Neisseria Gonorrhea: NEGATIVE
Trichomonas: NEGATIVE

## 2022-02-26 NOTE — Progress Notes (Signed)
Because of pain radiating around to the abdomen/groin and ongoing pain despite injection last night, I feel your condition warrants further evaluation and I recommend that you be seen in a face to face visit.   NOTE: There will be NO CHARGE for this eVisit   If you are having a true medical emergency please call 911.      For an urgent face to face visit, [place] has eight urgent care centers for your convenience:   NEW!! [place] [facility] [place] [facility] [place] Get Driving Directions [id number] [address] [place], [place] [facility] [place] [facility] [place] Get Driving Directions [id number] [place], [place] [address]   [place] [facility]) Get Driving Directions [id number] [address], [place] [address]  [place] [facility] [place] ([place]) Get Driving Directions [id number] [address] [place],  [place]  [address]  [place] [facility] [place] [facility] at [place] Get Driving Directions  [id number] [place],  [place] [address]   [place] [facility] [place] Get Driving Directions [id number] [address] [place], [place], [place] [address]   [place] [facility] [place] Get Driving Directions  [id number] [address] [place],  [address]   [facility] at [place] Get Driving Directions [id number] [address] [place],  [address]  Your MyChart E-visit questionnaire answers were reviewed by a board certified advanced clinical practitioner to complete your personal care plan based on your specific symptoms.  Thank you for using e-Visits.

## 2022-03-01 ENCOUNTER — Telehealth: Payer: BC Managed Care – PPO

## 2022-03-01 NOTE — ED Provider Notes (Addendum)
[facility]    CSN: [id number] Arrival date & time: 02/25/22  1745      History   Chief Complaint Chief Complaint  Patient presents with   Urinary Frequency    HPI Tonya Lee is a 37 y.o. female.   Presenting today with several day history of low back pain radiating to abdomen, nausea, urinary frequency and hesitancy. Denies hematuria, fever, chills, vomiting, bowel changes, vaginal sxs. So far not trying anything OTC for sxs. No concern for STIs or pregnancy, IUD in place.     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[place])    GERD (gastroesophageal reflux disease)    Migraines    Peptic ulcer disease     Patient Active Problem List   Diagnosis Date Noted   Pruritic rash 08/14/2021   Other allergic rhinitis 08/14/2021   History of asthma 08/14/2021   Mild persistent asthma with acute exacerbation 07/07/2018   Migraine with aura and without status migrainosus, not intractable 10/11/2017   S/P right knee arthroscopy 04/19/2017   PUD (peptic ulcer disease) 04/18/2017   Colitis 04/16/2017   GERD (gastroesophageal reflux disease) 02/28/2015    Past Surgical History:  Procedure Laterality Date   ANKLE SURGERY Right july 2013   FASCIECTOMY Right    right lower leg   KNEE SURGERY      OB History     Gravida  1   Para      Term      Preterm      AB      Living         SAB      IAB      Ectopic      Multiple      Live Births               Home Medications    Prior to Admission medications   Medication Sig Start Date End Date Taking? Authorizing Provider  diphenhydrAMINE (BENADRYL) 25 MG tablet Take 25 mg by mouth every 6 (six) hours as needed. Patient not taking: Reported on 12/22/2021    [provider]  fexofenadine-pseudoephedrine (ALLEGRA-D) 60-120 MG 12 hr tablet Take 1 tablet by mouth every 12 (twelve) hours. 09/30/21   Leath-Warren, Alda Lea, NP  fluticasone  (FLONASE) 50 MCG/ACT nasal spray Place 2 sprays into both nostrils daily. Patient not taking: Reported on 12/22/2021 09/30/21   Leath-Warren, Alda Lea, NP  meclizine (ANTIVERT) 25 MG tablet Take 1 tablet (25 mg total) by mouth 3 (three) times daily as needed for dizziness. 12/22/21   Sharion Balloon, FNP  Multiple Vitamin (MULTIVITAMIN ADULT PO) Multivitamin    [provider]  ondansetron (ZOFRAN) 4 MG tablet Take 1 tablet (4 mg total) by mouth every 8 (eight) hours as needed for nausea or vomiting. 12/22/21   Sharion Balloon, FNP    Family History Family History  Problem Relation Age of Onset   Asthma Mother    Hyperlipidemia Maternal Grandmother    Hypertension Maternal Grandmother    Asthma Maternal Grandmother    Heart disease Maternal Grandfather    Cancer Maternal Grandfather        thyroid   Diabetes Maternal Grandfather    Heart disease Paternal Grandfather     Social History Social History   Tobacco Use   Smoking status: Never   Smokeless tobacco: Never  Vaping Use   Vaping Use: Never used  Substance Use  Topics   Alcohol use: No   Drug use: No     Allergies   Sulfa antibiotics   Review of Systems Review of Systems PER HPI  Physical Exam Triage Vital Signs ED Triage Vitals  Enc Vitals Group     BP 02/25/22 1915 113/76     Pulse Rate 02/25/22 1915 69     Resp 02/25/22 1915 16     Temp 02/25/22 1915 98.7 F (37.1 C)     Temp Source 02/25/22 1915 Oral     SpO2 02/25/22 1915 99 %     Weight --      Height --      Head Circumference --      Peak Flow --      Pain Score 02/25/22 1920 7     Pain Loc --      Pain Edu? --      Excl. in [place]? --    No data found.  Updated Vital Signs BP 113/76 (BP Location: Right Arm)   Pulse 69   Temp 98.7 F (37.1 C) (Oral)   Resp 16   SpO2 99%   Visual Acuity Right Eye Distance:   Left Eye Distance:   Bilateral Distance:    Right Eye Near:   Left Eye Near:    Bilateral Near:     Physical  Exam Vitals and nursing note reviewed.  Constitutional:      Appearance: Normal appearance. She is not ill-appearing.  HENT:     Head: Atraumatic.  Eyes:     Extraocular Movements: Extraocular movements intact.     Conjunctiva/sclera: Conjunctivae normal.  Cardiovascular:     Rate and Rhythm: Normal rate and regular rhythm.     Heart sounds: Normal heart sounds.  Pulmonary:     Effort: Pulmonary effort is normal.     Breath sounds: Normal breath sounds.  Abdominal:     General: Bowel sounds are normal. There is no distension.     Palpations: Abdomen is soft.     Tenderness: There is abdominal tenderness (mild suprapubic ttp without distention or guarding). There is no right CVA tenderness, left CVA tenderness or guarding.  Genitourinary:    Comments: GU exam deferred Musculoskeletal:        General: Tenderness present. No swelling. Normal range of motion.     Cervical back: Normal range of motion and neck supple.     Comments: Right lumbar ttp, no midline spinal ttp diffusely. Neg SLR b/l  Skin:    General: Skin is warm and dry.  Neurological:     Mental Status: She is alert and oriented to person, place, and time.  Psychiatric:        Mood and Affect: Mood normal.        Thought Content: Thought content normal.        Judgment: Judgment normal.      UC Treatments / Results  Labs (all labs ordered are listed, but only abnormal results are displayed) Labs Reviewed  POCT URINALYSIS DIP (MANUAL ENTRY) - Abnormal; Notable for the following components:      Result Value   Spec Grav, UA >=1.030 (*)    All other components within normal limits  POCT URINE PREGNANCY  CERVICOVAGINAL ANCILLARY ONLY    EKG   Radiology No results found.  Procedures Procedures (including critical care time)  Medications Ordered in UC Medications  ketorolac (TORADOL) 30 MG/ML injection 30 mg (30 mg Intramuscular Given 02/25/22 1950)  Initial Impression / Assessment and Plan / UC  Course  I have reviewed the triage vital signs and the nursing notes.  Pertinent labs & imaging results that were available during my care of the patient were reviewed by me and considered in my medical decision making (see chart for details).     Urinalysis without evidence of UTI or kidney stone, vaginal swab pending for further r/o, suspect back pain to be muscular in nature. IM toradol given, discussed fluids, supportive measures and return precautions.  Final Clinical Impressions(s) / UC Diagnoses   Final diagnoses:  Right lumbar pain  Suprapubic pressure  Urinary frequency     Discharge Instructions      Your urinalysis today and urine pregnancy test were both negative for abnormalities.  We have sent out a vaginal swab just to make sure and rule out other possible infections causing symptoms.  We have also given you a shot of Toradol today for pain and inflammation, this should help for the next 2 days.  You may also use a heating pad, drink plenty of water and follow-up with your primary care provider for a recheck    ED Prescriptions   None    PDMP not reviewed this encounter.   Volney American, [place] 03/01/22 [place], [place], [place] 03/01/22 2242

## 2022-03-03 ENCOUNTER — Encounter: Payer: Self-pay | Admitting: Family Medicine

## 2022-03-03 ENCOUNTER — Telehealth (INDEPENDENT_AMBULATORY_CARE_PROVIDER_SITE_OTHER): Payer: BC Managed Care – PPO | Admitting: Family Medicine

## 2022-03-03 ENCOUNTER — Other Ambulatory Visit: Payer: Self-pay

## 2022-03-03 ENCOUNTER — Telehealth: Payer: Self-pay | Admitting: Family Medicine

## 2022-03-03 DIAGNOSIS — N200 Calculus of kidney: Secondary | ICD-10-CM

## 2022-03-03 DIAGNOSIS — R1011 Right upper quadrant pain: Secondary | ICD-10-CM

## 2022-03-03 LAB — MICROSCOPIC EXAMINATION: Renal Epithel, UA: NONE SEEN /hpf

## 2022-03-03 LAB — URINALYSIS, COMPLETE
Bilirubin, UA: NEGATIVE
Glucose, UA: NEGATIVE
Leukocytes,UA: NEGATIVE
Nitrite, UA: NEGATIVE
Specific Gravity, UA: >1.03 — ABNORMAL HIGH (ref 1.005–1.030)
Urobilinogen, Ur: 0.2 mg/dL (ref 0.2–1.0)
pH, UA: 6 (ref 5.0–7.5)

## 2022-03-03 MED ORDER — TAMSULOSIN HCL 0.4 MG PO CAPS: 0.4000 mg | ORAL_CAPSULE | Freq: Every day | ORAL | 0 refills | Status: DC

## 2022-03-03 NOTE — Telephone Encounter (Signed)
Pt has been informed of results and Dettinger's recommendations. She would like to go ahead and have a CT ordered before culture returns since there is blood in her urine, possible kidney stone and because symptoms started three weeks ago.

## 2022-03-03 NOTE — Telephone Encounter (Signed)
Placed order for CT renal stat

## 2022-03-03 NOTE — Progress Notes (Signed)
Virtual Visit via mychart video Note  I connected with Tonya Lee on 03/03/22 at 0802 by video and verified that I am speaking with the correct person using two identifiers. Tonya Lee is currently located at on the road and patient are currently with her during visit. The provider, Fransisca Kaufmann Jagger Demonte, MD is located in their office at time of visit.  Call ended at [phone]  I discussed the limitations, risks, security and privacy concerns of performing an evaluation and management service by video and the availability of in person appointments. I also discussed with the patient that there may be a patient responsible charge related to this service. The patient expressed understanding and agreed to proceed.   History and Present Illness: Patient was having severe back pain in her lower back and feels pressure in her bladder.  She started 2 weeks ago and then went into an urgent care last week.  She did feel better after Toradol shot for 2 hours and then came back. She went to urgent care last Thursday.  She has a little improvement over the weekend.  She says lower back and worse on right.  She has frequency and urgency.  She was tested for UTI and was negative. She says pain in abdomen started last week. She is still having some frequency and urgency. She has a little nausea over the past few days. She is not taking medicine for it.   1. Renal stone     Outpatient Encounter Medications as of 03/03/2022  Medication Sig   tamsulosin (FLOMAX) 0.4 MG CAPS capsule Take 1 capsule (0.4 mg total) by mouth daily.   diphenhydrAMINE (BENADRYL) 25 MG tablet Take 25 mg by mouth every 6 (six) hours as needed. (Patient not taking: Reported on 12/22/2021)   fexofenadine-pseudoephedrine (ALLEGRA-D) 60-120 MG 12 hr tablet Take 1 tablet by mouth every 12 (twelve) hours.   fluticasone (FLONASE) 50 MCG/ACT nasal spray Place 2 sprays into both nostrils daily. (Patient not taking: Reported on 12/22/2021)    meclizine (ANTIVERT) 25 MG tablet Take 1 tablet (25 mg total) by mouth 3 (three) times daily as needed for dizziness.   Multiple Vitamin (MULTIVITAMIN ADULT PO) Multivitamin   ondansetron (ZOFRAN) 4 MG tablet Take 1 tablet (4 mg total) by mouth every 8 (eight) hours as needed for nausea or vomiting.   No facility-administered encounter medications on file as of 03/03/2022.    Review of Systems  Constitutional:  Negative for chills and fever.  Eyes:  Negative for redness and visual disturbance.  Respiratory:  Negative for chest tightness and shortness of breath.   Cardiovascular:  Negative for chest pain and leg swelling.  Genitourinary:  Positive for dysuria, frequency and urgency. Negative for difficulty urinating, hematuria, vaginal bleeding, vaginal discharge and vaginal pain.  Musculoskeletal:  Negative for back pain and gait problem.  Skin:  Negative for rash.  Neurological:  Negative for dizziness, light-headedness and headaches.  Psychiatric/Behavioral:  Negative for agitation and behavioral problems.   All other systems reviewed and are negative.   Observations/Objective: Patient sounds comfortable and in no acute distress  Assessment and Plan: Problem List Items Addressed This Visit   None Visit Diagnoses     Renal stone    -  Primary   Relevant Medications   tamsulosin (FLOMAX) 0.4 MG CAPS capsule   Other Relevant Orders   Urinalysis, Complete   Urine Culture     Symptoms plan renal stone, discussed possible CT scan because been going on  but she says improved over the past few days but will wait another day or 2, if still there or not improving then we will likely will need to do CT renal protocol.  She will call us back in the next day or 2 to let us know how she is doing we may order the CT renal scan before the weekend, will likely have to order it stat   Follow up plan: Return if symptoms worsen or fail to improve.     I discussed the assessment and  treatment plan with the patient. The patient was provided an opportunity to ask questions and all were answered. The patient agreed with the plan and demonstrated an understanding of the instructions.   The patient was advised to call back or seek an in-person evaluation if the symptoms worsen or if the condition fails to improve as anticipated.  The above assessment and management plan was discussed with the patient. The patient verbalized understanding of and has agreed to the management plan. Patient is aware to call the clinic if symptoms persist or worsen. Patient is aware when to return to the clinic for a follow-up visit. Patient educated on when it is appropriate to go to the emergency department.    I provided 14 minutes of non-face-to-face time during this encounter.    Worthy Rancher, MD

## 2022-03-03 NOTE — Telephone Encounter (Signed)
Patient's urinalysis showed a mild amount of bacteria but I do not think it is infectious, I do think it is kidney stone related and I think she should go forward with the same stuff we talked about in the visit about doing the Flomax and hydration and to let me know if it is not improved in the next day or 2 but based on what I see I do not think it is infectious

## 2022-03-04 ENCOUNTER — Ambulatory Visit (HOSPITAL_COMMUNITY)
Admission: RE | Admit: 2022-03-04 | Discharge: 2022-03-04 | Disposition: A | Discharge status: Home / Self Care | Payer: BC Managed Care – PPO | Source: Ambulatory Visit | Attending: Family Medicine | Admitting: Family Medicine

## 2022-03-04 ENCOUNTER — Other Ambulatory Visit: Payer: Self-pay | Admitting: Family Medicine

## 2022-03-04 DIAGNOSIS — R1011 Right upper quadrant pain: Secondary | ICD-10-CM | POA: Insufficient documentation

## 2022-03-04 MED ORDER — CEPHALEXIN 500 MG PO CAPS: 500.0000 mg | ORAL_CAPSULE | Freq: Four times a day (QID) | ORAL | 0 refills | Status: DC

## 2022-03-04 NOTE — Telephone Encounter (Signed)
Pt made aware during telephone encounter on 2/21.

## 2022-03-05 LAB — URINE CULTURE: Organism ID, Bacteria: NO GROWTH

## 2022-03-25 ENCOUNTER — Other Ambulatory Visit: Payer: Self-pay | Admitting: Family Medicine

## 2022-03-25 DIAGNOSIS — N200 Calculus of kidney: Secondary | ICD-10-CM

## 2022-03-25 NOTE — Telephone Encounter (Signed)
Last office visit 03/03/22 for kidney stone Last refill 03/03/22, #30, no refills Pharmacy requesting 90 day prescription

## 2022-03-30 ENCOUNTER — Telehealth: Payer: Self-pay

## 2022-03-30 MED ORDER — EPINEPHRINE 0.3 MG/0.3ML IJ SOAJ: INTRAMUSCULAR | 2 refills | Status: AC

## 2022-03-30 NOTE — Telephone Encounter (Signed)
Patient called in stating she is breaking out in hives. Patient consumed a dairy product. She ate a hamburger and feels like her face is hot and feels as if she is sunburnt. She states her lip is tingling. I spoke with Dr.Kim and informed to take antihistamines, benadryl and go to the nearest Urgent care to be evaluated. Patient requested an Epipen to be sent to her pharmacy. I scheduled patient a follow up appointment to come in to be seen. I informed patient to call us if she needs anything else.

## 2022-03-31 NOTE — Progress Notes (Unsigned)
Follow Up Note  RE: Tonya Lee MRN: [id number] DOB: May 13, 1985 Date of Office Visit: 04/01/2022  Referring provider: Dettinger, Fransisca Kaufmann, MD Primary care provider: Dettinger, Fransisca Kaufmann, MD  Chief Complaint: No chief complaint on file.  History of Present Illness: I had the pleasure of seeing Tonya Lee for a follow up visit at the [facility] on 03/31/2022. She is a 37 y.o. female, who is being followed for pruritic rash, allergic rhinitis and history of asthma. Her previous allergy office visit was on 09/24/2021 with Dr. Maudie Mercury. Today is a regular follow up visit.  Pruritic rash Past history - Pruritic rash x 2 months. Initially started on upper torso then spread. Now improved after prednisone, Pepcid, benadryl and hydrocortisone. Multiple tick bites but had negative alpha gal bloodwork. Denies any changes in diet, meds, personal care products or recent infections. Interim history - bloodwork unremarkable. Still breaking out with no triggers noted. Zyrtec somewhat effective. Today's skin prick testing showed: Positive to cat. Borderline positive to green pea and garlic.  Start fexofenadine (allegra) 180mg  twice a day. If symptoms are not controlled or causes drowsiness let us know. Start pepcid (famotidine) 20mg  twice a day.  Start Singulair (montelukast) 10mg  daily at night if no improvement after 2 weeks. Cautioned that in some children/adults can experience behavioral changes including hyperactivity, agitation, depression, sleep disturbances and suicidal ideations. These side effects are rare, but if you notice them you should notify me and discontinue Singulair (montelukast). Avoid the following potential triggers: alcohol, tight clothing, NSAIDs, hot showers and getting overheated. Keep track of episodes. If no improvement will refer to dermatology next for possible skin biopsy.   Avoid peas, garlic, dairy - not sure if contributing to itchy rash or not.   Continue strict avoidance of all red meat due to GI symptoms.  For mild symptoms you can take over the counter antihistamines such as Benadryl and monitor symptoms closely. If symptoms worsen or if you have severe symptoms including breathing issues, throat closure, significant swelling, whole body hives, severe diarrhea and vomiting, lightheadedness then seek immediate medical care.   Other allergic rhinitis Past history - Skin testing as a child showed multiple positives. No prior AIT. Allergic symptoms improved the last few years.  Interim history - 2023 bloodwork positive to cat and dog. Today's skin prick testing showed: Positive to cat. Start environmental control measures as below. The antihistamines above should also help with these symptoms.   History of asthma Past history - Mainly exercise induced. No recent albuterol use. Monitor symptoms.  Assessment and Plan: [name] is a 37 y.o. female with: No problem-specific Assessment & Plan notes found for this encounter.  No follow-ups on file.  No orders of the defined types were placed in this encounter.  Lab Orders  No laboratory test(s) ordered today    Diagnostics: Spirometry:  Tracings reviewed. Her effort: {Blank single:19197::"Good reproducible efforts.","It was hard to get consistent efforts and there is a question as to whether this reflects a maximal maneuver.","Poor effort, data can not be interpreted."} FVC: ***L FEV1: ***L, ***% predicted FEV1/FVC ratio: ***% Interpretation: {Blank single:19197::"Spirometry consistent with mild obstructive disease","Spirometry consistent with moderate obstructive disease","Spirometry consistent with severe obstructive disease","Spirometry consistent with possible restrictive disease","Spirometry consistent with mixed obstructive and restrictive disease","Spirometry uninterpretable due to technique","Spirometry consistent with normal pattern","No overt abnormalities noted given  today's efforts"}.  Please see scanned spirometry results for details.  Skin Testing: {Blank single:19197::"Select foods","Environmental allergy panel","Environmental allergy panel and select  foods","Food allergy panel","None","Deferred due to recent antihistamines use"}. *** Results discussed with patient/family.   Medication List:  Current Outpatient Medications  Medication Sig Dispense Refill   cephALEXin (KEFLEX) 500 MG capsule Take 1 capsule (500 mg total) by mouth 4 (four) times daily. 28 capsule 0   diphenhydrAMINE (BENADRYL) 25 MG tablet Take 25 mg by mouth every 6 (six) hours as needed. (Patient not taking: Reported on 12/22/2021)     EPINEPHrine (EPIPEN 2-PAK) 0.3 mg/0.3 mL IJ SOAJ injection Inject 0.3mg  into the muscle once for anaphylaxis 2 each 2   fexofenadine-pseudoephedrine (ALLEGRA-D) 60-120 MG 12 hr tablet Take 1 tablet by mouth every 12 (twelve) hours. 30 tablet 0   fluticasone (FLONASE) 50 MCG/ACT nasal spray Place 2 sprays into both nostrils daily. (Patient not taking: Reported on 12/22/2021) 16 g 0   meclizine (ANTIVERT) 25 MG tablet Take 1 tablet (25 mg total) by mouth 3 (three) times daily as needed for dizziness. 60 tablet 2   Multiple Vitamin (MULTIVITAMIN ADULT PO) Multivitamin     ondansetron (ZOFRAN) 4 MG tablet Take 1 tablet (4 mg total) by mouth every 8 (eight) hours as needed for nausea or vomiting. 20 tablet 0   tamsulosin (FLOMAX) 0.4 MG CAPS capsule TAKE 1 CAPSULE BY MOUTH EVERY DAY 90 capsule 1   No current facility-administered medications for this visit.   Allergies: Allergies  Allergen Reactions   Sulfa Antibiotics Anaphylaxis, Hives and Other (See Comments)    Throat and face swelled   I reviewed her past medical history, social history, family history, and environmental history and no significant changes have been reported from her previous visit.  Review of Systems  Constitutional:  Negative for appetite change, chills, fever and unexpected  weight change.  HENT:  Negative for congestion and rhinorrhea.   Eyes:  Negative for itching.  Respiratory:  Negative for cough, chest tightness, shortness of breath and wheezing.   Cardiovascular:  Negative for chest pain.  Gastrointestinal:  Negative for abdominal pain.  Genitourinary:  Negative for difficulty urinating.  Skin:  Positive for rash.  Allergic/Immunologic: Positive for environmental allergies.  Neurological:  Negative for headaches.    Objective: There were no vitals taken for this visit. There is no height or weight on file to calculate BMI. Physical Exam Vitals and nursing note reviewed.  Constitutional:      Appearance: Normal appearance. She is well-developed.  HENT:     Head: Normocephalic and atraumatic.     Right Ear: Tympanic membrane and external ear normal.     Left Ear: Tympanic membrane and external ear normal.     Nose: Nose normal.     Mouth/Throat:     Mouth: Mucous membranes are moist.     Pharynx: Oropharynx is clear.  Eyes:     Conjunctiva/sclera: Conjunctivae normal.  Cardiovascular:     Rate and Rhythm: Normal rate and regular rhythm.     Heart sounds: Normal heart sounds. No murmur heard.    No friction rub. No gallop.  Pulmonary:     Effort: Pulmonary effort is normal.     Breath sounds: Normal breath sounds. No wheezing, rhonchi or rales.  Musculoskeletal:     Cervical back: Neck supple.  Skin:    General: Skin is warm.     Findings: No rash.  Neurological:     Mental Status: She is alert and oriented to person, place, and time.  Psychiatric:        Behavior: Behavior normal.  Previous notes and tests were reviewed. The plan was reviewed with the patient/family, and all questions/concerned were addressed.  It was my pleasure to see Afrah today and participate in her care. Please feel free to contact me with any questions or concerns.  Sincerely,  Rexene Alberts, DO Allergy & Immunology  [facility] office: [place] office: [phone]

## 2022-04-01 ENCOUNTER — Ambulatory Visit: Payer: BC Managed Care – PPO | Admitting: Allergy

## 2022-04-01 ENCOUNTER — Other Ambulatory Visit: Payer: Self-pay

## 2022-04-01 ENCOUNTER — Encounter: Payer: Self-pay | Admitting: Allergy

## 2022-04-01 VITALS — BP 102/72 | HR 72 | Temp 98.0°F | Resp 16 | Wt 128.2 lb

## 2022-04-01 DIAGNOSIS — T7840XD Allergy, unspecified, subsequent encounter: Secondary | ICD-10-CM | POA: Diagnosis not present

## 2022-04-01 DIAGNOSIS — L272 Dermatitis due to ingested food: Secondary | ICD-10-CM

## 2022-04-01 DIAGNOSIS — T7840XA Allergy, unspecified, initial encounter: Secondary | ICD-10-CM | POA: Insufficient documentation

## 2022-04-01 DIAGNOSIS — L282 Other prurigo: Secondary | ICD-10-CM | POA: Diagnosis not present

## 2022-04-01 DIAGNOSIS — J3089 Other allergic rhinitis: Secondary | ICD-10-CM | POA: Diagnosis not present

## 2022-04-01 DIAGNOSIS — Z8709 Personal history of other diseases of the respiratory system: Secondary | ICD-10-CM

## 2022-04-01 NOTE — Patient Instructions (Addendum)
Rash/skin: Avoid the clothing that give you issues. Get bloodwork in April. We are ordering labs, so please allow 1-2 weeks for the results to come back. With the newly implemented Cures Act, the labs might be visible to you at the same time that they become visible to me. However, I will not address the results until all of the results are back, so please be patient.  In the meantime, continue recommendations in your patient instructions, including avoidance measures (if applicable), until you hear from me. Continue fexofenadine (allegra) 180mg  twice a day. If symptoms are not controlled or causes drowsiness let us know. Continue pepcid (famotidine) 20mg  twice a day.  Avoid the following potential triggers: alcohol, tight clothing, NSAIDs, hot showers and getting overheated. Keep track of episodes. Avoid tick bites.   If no rash/itching after 1 month use the following step down: Decrease Pepcid to 20mg  once a day. Continue with allegra 180mg  twice a day. If no symptoms for 2 weeks then: Decrease allegra 180mg  to once a day. Continue Pepcid 20mg  once a day. If no symptoms for 2 weeks then: Stop Pepcid. Continue allegra 180mg  once a day. If no symptoms for 2 weeks then: Stop allegra. If you get symptoms then go back to the dose where you didn't have any symptoms.   Food 2023 skin testing showed: Borderline positive to green pea and garlic.  Okay to eat peas, garlic and dairy. Continue strict avoidance of all red meat. Epinephrine injectable device - demonstrated proper use. For mild symptoms you can take over the counter antihistamines such as Benadryl 1-2 tablets = 25-50mg .and monitor symptoms closely. If symptoms worsen or if you have severe symptoms including breathing issues, throat closure, significant swelling, whole body hives, severe diarrhea and vomiting, lightheadedness then inject epinephrine and seek immediate medical care afterwards. Emergency action plan given.  Environmental  allergies 2023 allergy testing positive to cat and dog.  Continue environmental control measures as below.  Follow up in 4 months or sooner if needed.    Pet Allergen Avoidance: Contrary to popular opinion, there are no "hypoallergenic" breeds of dogs or cats. That is because people are not allergic to an animal's hair, but to an allergen found in the animal's saliva, dander (dead skin flakes) or urine. Pet allergy symptoms typically occur within minutes. For some people, symptoms can build up and become most severe 8 to 12 hours after contact with the animal. People with severe allergies can experience reactions in public places if dander has been transported on the pet owners' clothing. Keeping an animal outdoors is only a partial solution, since homes with pets in the yard still have higher concentrations of animal allergens. Before getting a pet, ask your allergist to determine if you are allergic to animals. If your pet is already considered part of your family, try to minimize contact and keep the pet out of the bedroom and other rooms where you spend a great deal of time. As with dust mites, vacuum carpets often or replace carpet with a hardwood floor, tile or linoleum. High-efficiency particulate air (HEPA) cleaners can reduce allergen levels over time. While dander and saliva are the source of cat and dog allergens, urine is the source of allergens from rabbits, hamsters, mice and [place] pigs; so ask a non-allergic family member to clean the animal's cage. If you have a pet allergy, talk to your allergist about the potential for allergy immunotherapy (allergy shots). This strategy can often provide long-term relief.

## 2022-04-01 NOTE — Assessment & Plan Note (Signed)
Past history - Pruritic rash x 2 months. Initially started on upper torso then spread. Now improved after prednisone, Pepcid, benadryl and hydrocortisone. Multiple tick bites but had negative alpha gal bloodwork. Denies any changes in diet, meds, personal care products or recent infections. 2023 skin testing positive to cat. Borderline positive to green pea and garlic.  Interim history - no issues and was able to reintroduce beef/milk into diet 2 months ago. Earlier this week had reaction with rash, difficulty swallowing requiring UC visit with steroids. She had a tick bite the past weekend and ate beef 2 days in a row.  Avoid the clothing that give you issues. Get bloodwork in April. Continue fexofenadine (allegra) 180mg  twice a day. If symptoms are not controlled or causes drowsiness let us know. Continue pepcid (famotidine) 20mg  twice a day.  Avoid the following potential triggers: alcohol, tight clothing, NSAIDs, hot showers and getting overheated. Keep track of episodes. Avoid tick bites.  If no rash/itching after 1 month use the following step down schedule.  Decrease Pepcid to 20mg  once a day. Continue with allegra 180mg  twice a day. If no symptoms for 2 weeks then: Decrease allegra 180mg  to once a day. Continue Pepcid 20mg  once a day. If no symptoms for 2 weeks then: Stop Pepcid. Continue allegra 180mg  once a day. If no symptoms for 2 weeks then: Stop allegra. If you get symptoms then go back to the dose where you didn't have any symptoms.

## 2022-04-01 NOTE — Assessment & Plan Note (Signed)
Past history - 2023 skin testing showed: Borderline positive to green pea and garlic.  Okay to eat peas, garlic and dairy. Continue strict avoidance of all red meat. Get bloodwork. Epinephrine injectable device - demonstrated proper use. For mild symptoms you can take over the counter antihistamines such as Benadryl 1-2 tablets = 25-50mg .and monitor symptoms closely. If symptoms worsen or if you have severe symptoms including breathing issues, throat closure, significant swelling, whole body hives, severe diarrhea and vomiting, lightheadedness then inject epinephrine and seek immediate medical care afterwards. Emergency action plan given.

## 2022-04-01 NOTE — Assessment & Plan Note (Signed)
Past history - Skin testing as a child showed multiple positives. No prior AIT. Allergic symptoms improved the last few years. 2023 bloodwork positive to cat and dog. Continue environmental control measures as below.

## 2022-05-04 ENCOUNTER — Encounter: Payer: Self-pay | Admitting: Allergy

## 2022-05-04 LAB — ALPHA-GAL PANEL
Allergen Lamb IgE: <0.1 kU/L
Beef IgE: <0.1 kU/L
IgE (Immunoglobulin E), Serum: 15 IU/mL (ref 6–495)
O215-IgE Alpha-Gal: <0.1 kU/L
Pork IgE: <0.1 kU/L

## 2022-05-14 ENCOUNTER — Ambulatory Visit
Admission: EM | Admit: 2022-05-14 | Discharge: 2022-05-14 | Disposition: A | Discharge status: Home / Self Care | Payer: BC Managed Care – PPO | Attending: Physician Assistant | Admitting: Physician Assistant

## 2022-05-14 ENCOUNTER — Encounter: Payer: Self-pay | Admitting: Emergency Medicine

## 2022-05-14 DIAGNOSIS — R042 Hemoptysis: Secondary | ICD-10-CM | POA: Diagnosis not present

## 2022-05-14 DIAGNOSIS — J40 Bronchitis, not specified as acute or chronic: Secondary | ICD-10-CM

## 2022-05-14 DIAGNOSIS — J329 Chronic sinusitis, unspecified: Secondary | ICD-10-CM | POA: Diagnosis not present

## 2022-05-14 DIAGNOSIS — J4521 Mild intermittent asthma with (acute) exacerbation: Secondary | ICD-10-CM

## 2022-05-14 MED ORDER — PREDNISONE 20 MG PO TABS: 40.0000 mg | ORAL_TABLET | Freq: Every day | ORAL | 0 refills | Status: AC

## 2022-05-14 MED ORDER — ALBUTEROL SULFATE HFA 108 (90 BASE) MCG/ACT IN AERS: 1.0000 | INHALATION_SPRAY | Freq: Four times a day (QID) | RESPIRATORY_TRACT | 0 refills | Status: DC | PRN

## 2022-05-14 MED ORDER — AMOXICILLIN-POT CLAVULANATE 875-125 MG PO TABS: 1.0000 | ORAL_TABLET | Freq: Two times a day (BID) | ORAL | 0 refills | Status: DC

## 2022-05-14 NOTE — Discharge Instructions (Signed)
Start Augmentin twice daily for 7 days to cover for infection.  Take prednisone 40 mg for 4 days.  Do not take NSAIDs with this medication including aspirin, ibuprofen/Advil, naproxen/Aleve.  Continue your allergy medication as previously prescribed.  I have called in a refill of your albuterol inhaler to have on hand for shortness of breath and coughing fits.  If your symptoms or not improving within a week please return for reevaluation.  If anything worsens you need to be seen immediately.

## 2022-05-14 NOTE — ED Triage Notes (Signed)
Productive cough, sore throat, fatigue, and SOB x 1 week.  Has been taking mucinex without relief and takes allergra twice daily.

## 2022-05-14 NOTE — ED Provider Notes (Signed)
[facility]    CSN: [id number] Arrival date & time: 05/14/22  1552      History   Chief Complaint No chief complaint on file.   HPI Tonya Lee is a 37 y.o. female.   Patient presents today with a week plus long history of URI symptoms that have worsened prompting evaluation.  Reports that they initially began as some nasal congestion and mild cough.  Congestion symptoms have improved but she continues to have sore throat, cough, drainage, shortness of breath.  Denies any fever, nausea, vomiting.  Denies any known sick contacts.  She does report that she has a history of asthma but does not have her albuterol inhaler available; has only needed this when she was sick or with strenuous exercise.  She does have allergies and has been compliant with antihistamines as previously recommended.  She denies any recent antibiotics or steroids; last antibiotic use was doxycycline for tick bite several months ago.  She does report that the cough has become particular bothersome at night she is having difficulty sleeping.  She also reports blood streaks in the sputum at times.  She is confident that she is not pregnant.    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)    GERD (gastroesophageal reflux disease)    Migraines    Peptic ulcer disease     Patient Active Problem List   Diagnosis Date Noted   Allergic reaction 04/01/2022   Pruritic rash 08/14/2021   Other allergic rhinitis 08/14/2021   History of asthma 08/14/2021   Mild persistent asthma with acute exacerbation 07/07/2018   Migraine with aura and without status migrainosus, not intractable 10/11/2017   S/P right knee arthroscopy 04/19/2017   PUD (peptic ulcer disease) 04/18/2017   Colitis 04/16/2017   GERD (gastroesophageal reflux disease) 02/28/2015    Past Surgical History:  Procedure Laterality Date   ANKLE SURGERY Right july 2013   FASCIECTOMY Right    right  lower leg   KNEE SURGERY      OB History     Gravida  1   Para      Term      Preterm      AB      Living         SAB      IAB      Ectopic      Multiple      Live Births               Home Medications    Prior to Admission medications   Medication Sig Start Date End Date Taking? Authorizing Provider  albuterol (VENTOLIN HFA) 108 (90 Base) MCG/ACT inhaler Inhale 1-2 puffs into the lungs every 6 (six) hours as needed for wheezing or shortness of breath. 05/14/22  Yes Nylene Inlow K, PA-C  amoxicillin-clavulanate (AUGMENTIN) 875-125 MG tablet Take 1 tablet by mouth every 12 (twelve) hours. 05/14/22  Yes Solace Manwarren K, PA-C  predniSONE (DELTASONE) 20 MG tablet Take 2 tablets (40 mg total) by mouth daily for 4 days. 05/14/22 05/18/22 Yes Viki Carrera, Noberto Retort, PA-C  diphenhydrAMINE (BENADRYL) 25 MG tablet Take 25 mg by mouth every 6 (six) hours as needed.    [provider]  EPINEPHrine (EPIPEN 2-PAK) 0.3 mg/0.3 mL IJ SOAJ injection Inject 0.3mg  into the muscle once for anaphylaxis 03/30/22   Ellamae Sia, DO  famotidine (PEPCID) 20 MG tablet Take 1 tablet by mouth  2 (two) times daily.    [provider]  fexofenadine-pseudoephedrine (ALLEGRA-D) 60-120 MG 12 hr tablet Take 1 tablet by mouth every 12 (twelve) hours. 09/30/21   Leath-Warren, Sadie Haber, NP  fluticasone (FLONASE) 50 MCG/ACT nasal spray Place 2 sprays into both nostrils daily. 09/30/21   Leath-Warren, Sadie Haber, NP  hydrOXYzine (ATARAX) 25 MG tablet Take by mouth. 03/30/22   [provider]  Multiple Vitamin (MULTIVITAMIN ADULT PO) Multivitamin    [provider]    Family History Family History  Problem Relation Age of Onset   Asthma Mother    Hyperlipidemia Maternal Grandmother    Hypertension Maternal Grandmother    Asthma Maternal Grandmother    Heart disease Maternal Grandfather    Cancer Maternal Grandfather        thyroid   Diabetes Maternal Grandfather    Heart disease  Paternal Grandfather     Social History Social History   Tobacco Use   Smoking status: Never   Smokeless tobacco: Never  Vaping Use   Vaping Use: Never used  Substance Use Topics   Alcohol use: No   Drug use: No     Allergies   Sulfa antibiotics   Review of Systems Review of Systems  Constitutional:  Positive for activity change, appetite change and fatigue. Negative for fever.  HENT:  Positive for congestion. Negative for sinus pressure, sneezing and sore throat.   Respiratory:  Positive for cough and shortness of breath.   Cardiovascular:  Negative for chest pain.  Gastrointestinal:  Negative for abdominal pain, diarrhea, nausea and vomiting.  Musculoskeletal:  Negative for arthralgias and myalgias.     Physical Exam Triage Vital Signs ED Triage Vitals  Enc Vitals Group     BP 05/14/22 1556 109/72     Pulse Rate 05/14/22 1556 77     Resp 05/14/22 1556 18     Temp 05/14/22 1556 97.9 F (36.6 C)     Temp Source 05/14/22 1556 Oral     SpO2 05/14/22 1556 99 %     Weight --      Height --      Head Circumference --      Peak Flow --      Pain Score 05/14/22 1557 3     Pain Loc --      Pain Edu? --      Excl. in GC? --    No data found.  Updated Vital Signs BP 109/72 (BP Location: Right Arm)   Pulse 77   Temp 97.9 F (36.6 C) (Oral)   Resp 18   SpO2 99%   Visual Acuity Right Eye Distance:   Left Eye Distance:   Bilateral Distance:    Right Eye Near:   Left Eye Near:    Bilateral Near:     Physical Exam Vitals reviewed.  Constitutional:      General: She is awake. She is not in acute distress.    Appearance: Normal appearance. She is well-developed. She is not ill-appearing.     Comments: Very pleasant female appears stated age in no acute distress sitting comfortably in exam room  HENT:     Head: Normocephalic and atraumatic.     Right Ear: Ear canal and external ear normal. A middle ear effusion is present. Tympanic membrane is not  erythematous or bulging.     Left Ear: Ear canal and external ear normal. A middle ear effusion is present. Tympanic membrane is not erythematous or bulging.  Nose:     Right Sinus: Maxillary sinus tenderness present. No frontal sinus tenderness.     Left Sinus: Maxillary sinus tenderness present. No frontal sinus tenderness.     Mouth/Throat:     Pharynx: Uvula midline. Posterior oropharyngeal erythema present. No oropharyngeal exudate.     Comments: Erythema and drainage in posterior oropharynx Cardiovascular:     Rate and Rhythm: Normal rate and regular rhythm.     Heart sounds: Normal heart sounds, S1 normal and S2 normal. No murmur heard. Pulmonary:     Effort: Pulmonary effort is normal.     Breath sounds: Normal breath sounds. No wheezing, rhonchi or rales.     Comments: Clear to auscultation bilaterally Psychiatric:        Behavior: Behavior is cooperative.      UC Treatments / Results  Labs (all labs ordered are listed, but only abnormal results are displayed) Labs Reviewed - No data to display  EKG   Radiology No results found.  Procedures Procedures (including critical care time)  Medications Ordered in UC Medications - No data to display  Initial Impression / Assessment and Plan / UC Course  I have reviewed the triage vital signs and the nursing notes.  Pertinent labs & imaging results that were available during my care of the patient were reviewed by me and considered in my medical decision making (see chart for details).     Patient is well-appearing, afebrile, nontoxic, nontachycardic.  Viral testing was deferred as she is already been symptomatic for over a week and this would not change management.  Given prolonged and worsening symptoms will cover for secondary bacterial infection with Augmentin twice daily for 7 days.  Chest x-ray was deferred as she had oxygen saturation of 99% and no adventitious lung sounds on exam.  I am concerned that she might  also have a slight asthma exacerbation so we will treat with prednisone and refill her albuterol.  Discussed that she is not to take NSAIDs with prednisone due to risk of GI bleeding.  Can use over-the-counter medication including Mucinex, Tylenol, allergy medicine.  She is to rest and drink plenty of fluid.  If her symptoms or not improving within a week she is to return for reevaluation.  If she has any worsening symptoms she needs to be seen immediately.  Strict return precautions given.  Work excuse note provided.  Final Clinical Impressions(s) / UC Diagnoses   Final diagnoses:  Sinobronchitis  Blood-streaked sputum  Mild intermittent asthma with acute exacerbation     Discharge Instructions      Start Augmentin twice daily for 7 days to cover for infection.  Take prednisone 40 mg for 4 days.  Do not take NSAIDs with this medication including aspirin, ibuprofen/Advil, naproxen/Aleve.  Continue your allergy medication as previously prescribed.  I have called in a refill of your albuterol inhaler to have on hand for shortness of breath and coughing fits.  If your symptoms or not improving within a week please return for reevaluation.  If anything worsens you need to be seen immediately.     ED Prescriptions     Medication Sig Dispense Auth. Provider   amoxicillin-clavulanate (AUGMENTIN) 875-125 MG tablet Take 1 tablet by mouth every 12 (twelve) hours. 14 tablet Zawadi Aplin K, PA-C   predniSONE (DELTASONE) 20 MG tablet Take 2 tablets (40 mg total) by mouth daily for 4 days. 8 tablet Shawnetta Lein K, PA-C   albuterol (VENTOLIN HFA) 108 (90 Base) MCG/ACT  inhaler Inhale 1-2 puffs into the lungs every 6 (six) hours as needed for wheezing or shortness of breath. 18 g Landa Mullinax K, PA-C      PDMP not reviewed this encounter.   Jeani Hawking, PA-C 05/14/22 1628

## 2022-05-25 LAB — HM COLONOSCOPY

## 2022-06-12 ENCOUNTER — Ambulatory Visit (INDEPENDENT_AMBULATORY_CARE_PROVIDER_SITE_OTHER): Payer: BC Managed Care – PPO

## 2022-06-12 ENCOUNTER — Ambulatory Visit
Admission: EM | Admit: 2022-06-12 | Discharge: 2022-06-12 | Disposition: A | Discharge status: Home / Self Care | Payer: BC Managed Care – PPO | Attending: Nurse Practitioner | Admitting: Nurse Practitioner

## 2022-06-12 DIAGNOSIS — J209 Acute bronchitis, unspecified: Secondary | ICD-10-CM

## 2022-06-12 MED ORDER — BENZONATATE 100 MG PO CAPS: 100.0000 mg | ORAL_CAPSULE | Freq: Three times a day (TID) | ORAL | 0 refills | Status: DC | PRN

## 2022-06-12 MED ORDER — AZITHROMYCIN 250 MG PO TABS: ORAL_TABLET | ORAL | 0 refills | Status: DC

## 2022-06-12 MED ORDER — ALBUTEROL SULFATE HFA 108 (90 BASE) MCG/ACT IN AERS
2.0000 | INHALATION_SPRAY | Freq: Once | RESPIRATORY_TRACT | Status: AC
  Administered 2022-06-12: 2 via RESPIRATORY_TRACT

## 2022-06-12 NOTE — ED Triage Notes (Signed)
Chest congestion and cough. X 1 week. Sore throat, no fever Green/yellow mucus. Had bronchitis 1 month ago. SOB with exertion. mucinex  and delsom

## 2022-06-12 NOTE — ED Provider Notes (Signed)
[facility]    CSN: [id number] Arrival date & time: 06/12/22  1413      History   Chief Complaint No chief complaint on file.   HPI Tonya Lee is a 37 y.o. female.   Patient presents today with 1 week history of congested cough, coughing up discolored mucus, shortness of breath, burning in her lungs when she coughs, chest pain after coughing, chest tightness, sore throat from coughing so much, decreased appetite, and fatigue.  She denies fever, body aches or chills, wheezing, runny or stuffy nose, headache, ear pain, abdominal pain, nausea/vomiting, and diarrhea.  No known sick contacts.  Reports approximately 1 month ago, she had sudden bronchitis and was treated with Augmentin and prednisone.  Reports that she completed treatment and thereafter was feeling much better.  Patient reports history of significant allergies, also has history of asthma.  Takes Flonase, Allegra, and Pepcid daily for allergies.  Reports she lost her albuterol inhaler at work, however has not felt like she is really needed it.  Has been taking Mucinex and Delsym for symptoms with minimal improvement.    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)    GERD (gastroesophageal reflux disease)    Migraines    Peptic ulcer disease     Patient Active Problem List   Diagnosis Date Noted   Allergic reaction 04/01/2022   Pruritic rash 08/14/2021   Other allergic rhinitis 08/14/2021   History of asthma 08/14/2021   Mild persistent asthma with acute exacerbation 07/07/2018   Migraine with aura and without status migrainosus, not intractable 10/11/2017   S/P right knee arthroscopy 04/19/2017   PUD (peptic ulcer disease) 04/18/2017   Colitis 04/16/2017   GERD (gastroesophageal reflux disease) 02/28/2015    Past Surgical History:  Procedure Laterality Date   ANKLE SURGERY Right july 2013   FASCIECTOMY Right    right lower leg   KNEE SURGERY       OB History     Gravida  1   Para      Term      Preterm      AB      Living         SAB      IAB      Ectopic      Multiple      Live Births               Home Medications    Prior to Admission medications   Medication Sig Start Date End Date Taking? Authorizing Provider  albuterol (VENTOLIN HFA) 108 (90 Base) MCG/ACT inhaler Inhale 1-2 puffs into the lungs every 6 (six) hours as needed for wheezing or shortness of breath. 05/14/22  Yes Raspet, Lugar Peon K, PA-C  azithromycin (ZITHROMAX) 250 MG tablet Take (2) tablets by mouth on day 1, then take (1) tablet by mouth on days 2-5. 06/12/22  Yes Valentino Nose, NP  benzonatate (TESSALON) 100 MG capsule Take 1 capsule (100 mg total) by mouth 3 (three) times daily as needed for cough. Do not take with alcohol or while driving or operating heavy machinery.  May cause drowsiness. 06/12/22  Yes Valentino Nose, NP  diphenhydrAMINE (BENADRYL) 25 MG tablet Take 25 mg by mouth every 6 (six) hours as needed.   Yes [provider]  EPINEPHrine (EPIPEN 2-PAK) 0.3 mg/0.3 mL IJ SOAJ injection Inject 0.3mg  into the muscle once for anaphylaxis 03/30/22  Yes Selena Batten,  Jamesetta So, DO  famotidine (PEPCID) 20 MG tablet Take 1 tablet by mouth 2 (two) times daily.   Yes [provider]  fexofenadine-pseudoephedrine (ALLEGRA-D) 60-120 MG 12 hr tablet Take 1 tablet by mouth every 12 (twelve) hours. 09/30/21  Yes Leath-Warren, Sadie Haber, NP  fluticasone (FLONASE) 50 MCG/ACT nasal spray Place 2 sprays into both nostrils daily. 09/30/21  Yes Leath-Warren, Sadie Haber, NP  hydrOXYzine (ATARAX) 25 MG tablet Take by mouth. 03/30/22  Yes [provider]  Multiple Vitamin (MULTIVITAMIN ADULT PO) Multivitamin   Yes [provider]    Family History Family History  Problem Relation Age of Onset   Asthma Mother    Hyperlipidemia Maternal Grandmother    Hypertension Maternal Grandmother    Asthma Maternal Grandmother     Heart disease Maternal Grandfather    Cancer Maternal Grandfather        thyroid   Diabetes Maternal Grandfather    Heart disease Paternal Grandfather     Social History Social History   Tobacco Use   Smoking status: Never   Smokeless tobacco: Never  Vaping Use   Vaping Use: Never used  Substance Use Topics   Alcohol use: No   Drug use: No     Allergies   Acyclovir, Prednisone, Sulfa antibiotics, and Metronidazole   Review of Systems Review of Systems Per HPI  Physical Exam Triage Vital Signs ED Triage Vitals  Enc Vitals Group     BP 06/12/22 1436 108/72     Pulse Rate 06/12/22 1436 69     Resp 06/12/22 1436 20     Temp 06/12/22 1436 98.3 F (36.8 C)     Temp Source 06/12/22 1436 Oral     SpO2 06/12/22 1436 98 %     Weight 06/12/22 1435 125 lb (56.7 kg)     Height --      Head Circumference --      Peak Flow --      Pain Score 06/12/22 1434 5     Pain Loc --      Pain Edu? --      Excl. in GC? --    No data found.  Updated Vital Signs BP 108/72 (BP Location: Right Arm)   Pulse 69   Temp 98.3 F (36.8 C) (Oral)   Resp 20   Wt 125 lb (56.7 kg)   SpO2 98%   BMI 20.80 kg/m   Visual Acuity Right Eye Distance:   Left Eye Distance:   Bilateral Distance:    Right Eye Near:   Left Eye Near:    Bilateral Near:     Physical Exam Vitals and nursing note reviewed.  Constitutional:      General: She is not in acute distress.    Appearance: Normal appearance. She is not ill-appearing or toxic-appearing.  HENT:     Head: Normocephalic and atraumatic.     Right Ear: Tympanic membrane, ear canal and external ear normal.     Left Ear: Tympanic membrane, ear canal and external ear normal.     Nose: No congestion or rhinorrhea.     Mouth/Throat:     Mouth: Mucous membranes are moist.     Pharynx: Oropharynx is clear. No oropharyngeal exudate or posterior oropharyngeal erythema.  Eyes:     General: No scleral icterus.    Extraocular Movements:  Extraocular movements intact.  Cardiovascular:     Rate and Rhythm: Normal rate and regular rhythm.  Pulmonary:  Effort: Pulmonary effort is normal. No respiratory distress.     Breath sounds: Normal breath sounds. No wheezing, rhonchi or rales.  Abdominal:     General: Abdomen is flat. Bowel sounds are normal. There is no distension.     Palpations: Abdomen is soft.     Tenderness: There is no abdominal tenderness.  Musculoskeletal:     Cervical back: Normal range of motion and neck supple.  Lymphadenopathy:     Cervical: No cervical adenopathy.  Skin:    General: Skin is warm and dry.     Coloration: Skin is not jaundiced or pale.     Findings: No erythema or rash.  Neurological:     Mental Status: She is alert and oriented to person, place, and time.  Psychiatric:        Behavior: Behavior is cooperative.      UC Treatments / Results  Labs (all labs ordered are listed, but only abnormal results are displayed) Labs Reviewed - No data to display  EKG   Radiology DG Chest 2 View  Result Date: 06/12/2022 CLINICAL DATA:  Cough for 1 week EXAM: CHEST - 2 VIEW COMPARISON:  Chest x-ray 12/19/2021 FINDINGS: The heart size and mediastinal contours are within normal limits. Both lungs are clear. The visualized skeletal structures are unremarkable. IMPRESSION: No active cardiopulmonary disease. Electronically Signed   By: Darliss Cheney M.D.   On: 06/12/2022 15:12    Procedures Procedures (including critical care time)  Medications Ordered in UC Medications  albuterol (VENTOLIN HFA) 108 (90 Base) MCG/ACT inhaler 2 puff (2 puffs Inhalation Provided for home use 06/12/22 1505)    Initial Impression / Assessment and Plan / UC Course  I have reviewed the triage vital signs and the nursing notes.  Pertinent labs & imaging results that were available during my care of the patient were reviewed by me and considered in my medical decision making (see chart for details).   Patient is  well-appearing, normotensive, afebrile, not tachycardic, not tachypneic, oxygenating well on room air.    1. Acute bronchitis, unspecified organism Given history of asthma, patient coughing up thick, discolored mucus Will cover for atypical infection with azithromycin Chest x-ray today is negative for acute cardiopulmonary process Albuterol inhaler dispensed today for patient to have on hand if she needs it for wheezing/shortness of breath Start cough suppressant medication Strict ER and return precautions discussed  The patient was given the opportunity to ask questions.  All questions answered to their satisfaction.  The patient is in agreement to this plan.    Final Clinical Impressions(s) / UC Diagnoses   Final diagnoses:  Acute bronchitis, unspecified organism     Discharge Instructions      The chest x-ray today is negative for acute cardiopulmomary process.  Continue allergy medications, start azithromycin.  I have also sent  a cough suppressant medication to the pharmacy.  Continue Mucinex, increased hydration with water. Seek care for persistent or worsening symptoms despite treatment.     ED Prescriptions     Medication Sig Dispense Auth. Provider   benzonatate (TESSALON) 100 MG capsule Take 1 capsule (100 mg total) by mouth 3 (three) times daily as needed for cough. Do not take with alcohol or while driving or operating heavy machinery.  May cause drowsiness. 21 capsule Cathlean Marseilles A, NP   azithromycin (ZITHROMAX) 250 MG tablet Take (2) tablets by mouth on day 1, then take (1) tablet by mouth on days 2-5. 6 tablet Cathlean Marseilles  A, NP      PDMP not reviewed this encounter.   Valentino Nose, NP 06/12/22 [phone]

## 2022-06-12 NOTE — Discharge Instructions (Addendum)
The chest x-ray today is negative for acute cardiopulmomary process.  Continue allergy medications, start azithromycin.  I have also sent  a cough suppressant medication to the pharmacy.  Continue Mucinex, increased hydration with water. Seek care for persistent or worsening symptoms despite treatment.

## 2022-08-02 NOTE — Progress Notes (Unsigned)
Follow Up Note  RE: Tonya Lee MRN: [id number] DOB: [birth year] Date of Office Visit: 08/03/2022  Referring provider: Dettinger, Elige Radon, MD Primary care provider: Dettinger, Elige Radon, MD  Chief Complaint: No chief complaint on file.  History of Present Illness: I had the pleasure of seeing Tonya Lee for a follow up visit at the [facility] on 08/02/2022. She is a 37 y.o. female, who is being followed for pruritic rash, allergic reaction and allergic rhinitis. Her previous allergy office visit was on 04/01/2022 with Dr. Selena Batten. Today is a regular follow up visit.  Pruritic rash Past history - Pruritic rash x 2 months. Initially started on upper torso then spread. Now improved after prednisone, Pepcid, benadryl and hydrocortisone. Multiple tick bites but had negative alpha gal bloodwork. Denies any changes in diet, meds, personal care products or recent infections. 2023 skin testing positive to cat. Borderline positive to green pea and garlic.  Interim history - no issues and was able to reintroduce beef/milk into diet 2 months ago. Earlier this week had reaction with rash, difficulty swallowing requiring UC visit with steroids. She had a tick bite the past weekend and ate beef 2 days in a row.  Avoid the clothing that give you issues. Get bloodwork in April. Continue fexofenadine (allegra) 180mg  twice a day. If symptoms are not controlled or causes drowsiness let us know. Continue pepcid (famotidine) 20mg  twice a day.  Avoid the following potential triggers: alcohol, tight clothing, NSAIDs, hot showers and getting overheated. Keep track of episodes. Avoid tick bites.  If no rash/itching after 1 month use the following step down schedule.  Decrease Pepcid to 20mg  once a day. Continue with allegra 180mg  twice a day. If no symptoms for 2 weeks then: Decrease allegra 180mg  to once a day. Continue Pepcid 20mg  once a day. If no symptoms for 2 weeks then: Stop Pepcid.  Continue allegra 180mg  once a day. If no symptoms for 2 weeks then: Stop allegra. If you get symptoms then go back to the dose where you didn't have any symptoms.    Allergic reaction Past history - 2023 skin testing showed: Borderline positive to green pea and garlic.  Okay to eat peas, garlic and dairy. Continue strict avoidance of all red meat. Get bloodwork. Epinephrine injectable device - demonstrated proper use. For mild symptoms you can take over the counter antihistamines such as Benadryl 1-2 tablets = 25-50mg .and monitor symptoms closely. If symptoms worsen or if you have severe symptoms including breathing issues, throat closure, significant swelling, whole body hives, severe diarrhea and vomiting, lightheadedness then inject epinephrine and seek immediate medical care afterwards. Emergency action plan given.   Other allergic rhinitis Past history - Skin testing as a child showed multiple positives. No prior AIT. Allergic symptoms improved the last few years. 2023 bloodwork positive to cat and dog. Continue environmental control measures as below.   Return in about 4 months (around 08/01/2022).  Assessment and Plan: Tonya Lee is a 37 y.o. female with: No problem-specific Assessment & Plan notes found for this encounter.  No follow-ups on file.  No orders of the defined types were placed in this encounter.  Lab Orders  No laboratory test(s) ordered today    Diagnostics: Spirometry:  Tracings reviewed. Her effort: {Blank single:19197::"Good reproducible efforts.","It was hard to get consistent efforts and there is a question as to whether this reflects a maximal maneuver.","Poor effort, data can not be interpreted."} FVC: ***L FEV1: ***L, ***% predicted FEV1/FVC ratio: ***%  Interpretation: {Blank single:19197::"Spirometry consistent with mild obstructive disease","Spirometry consistent with moderate obstructive disease","Spirometry consistent with severe obstructive  disease","Spirometry consistent with possible restrictive disease","Spirometry consistent with mixed obstructive and restrictive disease","Spirometry uninterpretable due to technique","Spirometry consistent with normal pattern","No overt abnormalities noted given today's efforts"}.  Please see scanned spirometry results for details.  Skin Testing: {Blank single:19197::"Select foods","Environmental allergy panel","Environmental allergy panel and select foods","Food allergy panel","None","Deferred due to recent antihistamines use"}. *** Results discussed with patient/family.   Medication List:  Current Outpatient Medications  Medication Sig Dispense Refill   albuterol (VENTOLIN HFA) 108 (90 Base) MCG/ACT inhaler Inhale 1-2 puffs into the lungs every 6 (six) hours as needed for wheezing or shortness of breath. 18 g 0   azithromycin (ZITHROMAX) 250 MG tablet Take (2) tablets by mouth on day 1, then take (1) tablet by mouth on days 2-5. 6 tablet 0   benzonatate (TESSALON) 100 MG capsule Take 1 capsule (100 mg total) by mouth 3 (three) times daily as needed for cough. Do not take with alcohol or while driving or operating heavy machinery.  May cause drowsiness. 21 capsule 0   diphenhydrAMINE (BENADRYL) 25 MG tablet Take 25 mg by mouth every 6 (six) hours as needed.     EPINEPHrine (EPIPEN 2-PAK) 0.3 mg/0.3 mL IJ SOAJ injection Inject 0.3mg  into the muscle once for anaphylaxis 2 each 2   famotidine (PEPCID) 20 MG tablet Take 1 tablet by mouth 2 (two) times daily.     fexofenadine-pseudoephedrine (ALLEGRA-D) 60-120 MG 12 hr tablet Take 1 tablet by mouth every 12 (twelve) hours. 30 tablet 0   fluticasone (FLONASE) 50 MCG/ACT nasal spray Place 2 sprays into both nostrils daily. 16 g 0   hydrOXYzine (ATARAX) 25 MG tablet Take by mouth.     Multiple Vitamin (MULTIVITAMIN ADULT PO) Multivitamin     No current facility-administered medications for this visit.   Allergies: Allergies  Allergen Reactions    Acyclovir Hives   Prednisone Nausea And Vomiting, Other (See Comments) and Palpitations   Sulfa Antibiotics Anaphylaxis, Hives and Other (See Comments)    Throat and face swelled   Metronidazole Rash   I reviewed her past medical history, social history, family history, and environmental history and no significant changes have been reported from her previous visit.  Review of Systems  Constitutional:  Negative for appetite change, chills, fever and unexpected weight change.  HENT:  Negative for congestion and rhinorrhea.   Eyes:  Negative for itching.  Respiratory:  Negative for cough, chest tightness, shortness of breath and wheezing.   Cardiovascular:  Negative for chest pain.  Gastrointestinal:  Negative for abdominal pain.  Genitourinary:  Negative for difficulty urinating.  Skin:  Positive for rash.  Allergic/Immunologic: Positive for environmental allergies.  Neurological:  Negative for headaches.    Objective: There were no vitals taken for this visit. There is no height or weight on file to calculate BMI. Physical Exam Vitals and nursing note reviewed.  Constitutional:      Appearance: Normal appearance. She is well-developed.  HENT:     Head: Normocephalic and atraumatic.     Right Ear: Tympanic membrane and external ear normal.     Left Ear: Tympanic membrane and external ear normal.     Nose: Nose normal.     Mouth/Throat:     Mouth: Mucous membranes are moist.     Pharynx: Oropharynx is clear.  Eyes:     Conjunctiva/sclera: Conjunctivae normal.  Cardiovascular:     Rate and Rhythm:  Normal rate and regular rhythm.     Heart sounds: Normal heart sounds. No murmur heard.    No friction rub. No gallop.  Pulmonary:     Effort: Pulmonary effort is normal.     Breath sounds: Normal breath sounds. No wheezing, rhonchi or rales.  Musculoskeletal:     Cervical back: Neck supple.  Skin:    General: Skin is warm.     Findings: No rash.  Neurological:     Mental  Status: She is alert and oriented to person, place, and time.  Psychiatric:        Behavior: Behavior normal.    Previous notes and tests were reviewed. The plan was reviewed with the patient/family, and all questions/concerned were addressed.  It was my pleasure to see Tonya Lee today and participate in her care. Please feel free to contact me with any questions or concerns.  Sincerely,  Wyline Mood, DO Allergy & Immunology  [facility] office: [phone] [facility] office: [phone]

## 2022-08-03 ENCOUNTER — Encounter: Payer: Self-pay | Admitting: Allergy

## 2022-08-03 ENCOUNTER — Ambulatory Visit: Payer: BC Managed Care – PPO | Admitting: Allergy

## 2022-08-03 VITALS — BP 100/80 | HR 65 | Temp 98.2°F | Resp 16 | Ht 65.0 in | Wt 128.0 lb

## 2022-08-03 DIAGNOSIS — T781XXD Other adverse food reactions, not elsewhere classified, subsequent encounter: Secondary | ICD-10-CM | POA: Diagnosis not present

## 2022-08-03 DIAGNOSIS — L282 Other prurigo: Secondary | ICD-10-CM

## 2022-08-03 DIAGNOSIS — J3089 Other allergic rhinitis: Secondary | ICD-10-CM

## 2022-08-03 DIAGNOSIS — T7840XD Allergy, unspecified, subsequent encounter: Secondary | ICD-10-CM

## 2022-08-03 NOTE — Patient Instructions (Addendum)
Food  Continue to avoid mammalian meat and limit dairy intake.  Continue fexofenadine (allegra) 180mg  twice a day. If symptoms are not controlled or causes drowsiness let us know. Continue pepcid (famotidine) 20mg  twice a day.  Try to wean off famotidine. Take once a day for 2 weeks and if no issues then stop.  Avoid the following potential triggers: alcohol, tight clothing, NSAIDs, hot showers and getting overheated. Keep track of episodes. Avoid tick bites.   Environmental allergies 2023 allergy testing positive to cat and dog.  Continue environmental control measures as below.  Follow up in 6 months or sooner if needed.   Get alpha gal repeat bloodwork 2 weeks before next visit.   Pet Allergen Avoidance: Contrary to popular opinion, there are no "hypoallergenic" breeds of dogs or cats. That is because people are not allergic to an animal's hair, but to an allergen found in the animal's saliva, dander (dead skin flakes) or urine. Pet allergy symptoms typically occur within minutes. For some people, symptoms can build up and become most severe 8 to 12 hours after contact with the animal. People with severe allergies can experience reactions in public places if dander has been transported on the pet owners' clothing. Keeping an animal outdoors is only a partial solution, since homes with pets in the yard still have higher concentrations of animal allergens. Before getting a pet, ask your allergist to determine if you are allergic to animals. If your pet is already considered part of your family, try to minimize contact and keep the pet out of the bedroom and other rooms where you spend a great deal of time. As with dust mites, vacuum carpets often or replace carpet with a hardwood floor, tile or linoleum. High-efficiency particulate air (HEPA) cleaners can reduce allergen levels over time. While dander and saliva are the source of cat and dog allergens, urine is the source of allergens from  rabbits, hamsters, mice and [place] pigs; so ask a non-allergic family member to clean the animal's cage. If you have a pet allergy, talk to your allergist about the potential for allergy immunotherapy (allergy shots). This strategy can often provide long-term relief.

## 2022-08-03 NOTE — Assessment & Plan Note (Signed)
.   See assessment and plan as above. 

## 2022-08-03 NOTE — Assessment & Plan Note (Signed)
Past history - 2023 skin testing showed: Borderline positive to green pea and garlic.  Interim history - 2024 bloodwork negative to alpha gal. Had 2 pieces of bacon and hours afterwards developed diarrhea, nausea, and hives. Resolved with benadryl. Continue to avoid mammalian meat and limit dairy intake.  Continue fexofenadine (allegra) 180mg  twice a day. If symptoms are not controlled or causes drowsiness let us know. Continue Pepcid (famotidine) 20mg  twice a day.  Try to wean off famotidine. Take once a day for 2 weeks and if no issues then stop.  Avoid the following potential triggers: alcohol, tight clothing, NSAIDs, hot showers and getting overheated. Keep track of episodes. Avoid tick bites.  Repeat alpha gal bloodwork next year.

## 2022-08-03 NOTE — Assessment & Plan Note (Signed)
Past history - Skin testing as a child showed multiple positives. No prior AIT. Allergic symptoms improved the last few years. 2023 bloodwork positive to cat and dog. Continue environmental control measures as below. Controlled with allegra 180mg  BID.

## 2023-01-03 ENCOUNTER — Ambulatory Visit
Admission: EM | Admit: 2023-01-03 | Discharge: 2023-01-03 | Disposition: A | Discharge status: Home / Self Care | Payer: BC Managed Care – PPO | Attending: Nurse Practitioner | Admitting: Nurse Practitioner

## 2023-01-03 DIAGNOSIS — J069 Acute upper respiratory infection, unspecified: Secondary | ICD-10-CM

## 2023-01-03 MED ORDER — BENZONATATE 100 MG PO CAPS: 100.0000 mg | ORAL_CAPSULE | Freq: Three times a day (TID) | ORAL | 0 refills | Status: DC | PRN

## 2023-01-03 MED ORDER — PROMETHAZINE-DM 6.25-15 MG/5ML PO SYRP: 5.0000 mL | ORAL_SOLUTION | Freq: Every evening | ORAL | 0 refills | Status: DC | PRN

## 2023-01-03 NOTE — ED Triage Notes (Signed)
Runny nose, congestion, sinus pain and pressure, chest tightness, cough x 5 days. Taking sudafed, tylenol OTC cough syrup with no relief of symptoms.

## 2023-01-03 NOTE — ED Provider Notes (Signed)
[facility]    CSN: [id number] Arrival date & time: 01/03/23  1753      History   Chief Complaint Chief Complaint  Patient presents with   Cough    HPI Tonya Lee is a 37 y.o. female.      UPPER RESPIRATORY TRACT INFECTION Onset: 5 days COVID-19 testing history: COVID-19 vaccination status: Fever: {Blank single:19197::"yes","no"} Body aches: no Chills: last night Congested cough: yes Dry cough: {Blank single:19197::"yes","no"} Shortness of breath: with activity Wheezing: {Blank single:19197::"yes","no"} Chest pain: all the time, soreness, burns when she coughs Chest tightness: {Blank single:19197::"yes","no"} Chest congestion: {Blank single:19197::"yes","no"} Nasal congestion: yes Runny nose: yes Hoarseness: yes Post nasal drip: {Blank single:19197::"yes","no"} Sore throat: yes Sinus pressure: {Blank single:19197::"yes","no"} Headache: yes Ear pain: {Blank single:19197::"yes","no"}  Abdominal pain: no Nausea: no  Vomiting: no Diarrhea: no  Decreased appetite:yes  Loss of taste/smell: {Blank single:19197::"yes","no"}  Rash: {Blank single:19197::"yes","no"} Fatigue: yes Sick contacts: {Blank single:19197::"yes","no"} Treatments attempted: Sudafed, allergy med Relief with OTC medications:     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)    GERD (gastroesophageal reflux disease)    Migraines    Peptic ulcer disease     Patient Active Problem List   Diagnosis Date Noted   Allergic reaction 04/01/2022   Pruritic rash 08/14/2021   Perennial allergic rhinitis 08/14/2021   History of asthma 08/14/2021   Mild persistent asthma with acute exacerbation 07/07/2018   Migraine with aura and without status migrainosus, not intractable 10/11/2017   S/P right knee arthroscopy 04/19/2017   PUD (peptic ulcer disease) 04/18/2017   Colitis 04/16/2017   GERD (gastroesophageal reflux disease)  02/28/2015    Past Surgical History:  Procedure Laterality Date   ANKLE SURGERY Right july 2013   FASCIECTOMY Right    right lower leg   KNEE SURGERY      OB History     Gravida  1   Para      Term      Preterm      AB      Living         SAB      IAB      Ectopic      Multiple      Live Births               Home Medications    Prior to Admission medications   Medication Sig Start Date End Date Taking? Authorizing Provider  Multiple Vitamin (MULTIVITAMIN ADULT PO) Multivitamin   Yes [provider]  albuterol (VENTOLIN HFA) 108 (90 Base) MCG/ACT inhaler Inhale 1-2 puffs into the lungs every 6 (six) hours as needed for wheezing or shortness of breath. 05/14/22   Raspet, Noberto Retort, PA-C  EPINEPHrine (EPIPEN 2-PAK) 0.3 mg/0.3 mL IJ SOAJ injection Inject 0.3mg  into the muscle once for anaphylaxis 03/30/22   Ellamae Sia, DO  famotidine (PEPCID) 20 MG tablet Take 1 tablet by mouth 2 (two) times daily.    [provider]  fluticasone (FLONASE) 50 MCG/ACT nasal spray Place 2 sprays into both nostrils daily. 09/30/21   Leath-Warren, Sadie Haber, NP    Family History Family History  Problem Relation Age of Onset   Asthma Mother    Hyperlipidemia Maternal Grandmother    Hypertension Maternal Grandmother    Asthma Maternal Grandmother    Heart disease Maternal Grandfather    Cancer Maternal Grandfather        thyroid  Diabetes Maternal Grandfather    Heart disease Paternal Grandfather     Social History Social History   Tobacco Use   Smoking status: Never   Smokeless tobacco: Never  Vaping Use   Vaping status: Never Used  Substance Use Topics   Alcohol use: No   Drug use: No     Allergies   Acyclovir, Prednisone, Sulfa antibiotics, and Metronidazole   Review of Systems Review of Systems   Physical Exam Triage Vital Signs ED Triage Vitals  Encounter Vitals Group     BP 01/03/23 1826 115/80     Systolic BP Percentile --       Diastolic BP Percentile --      Pulse Rate 01/03/23 1826 88     Resp 01/03/23 1826 16     Temp 01/03/23 1826 99.4 F (37.4 C)     Temp Source 01/03/23 1826 Oral     SpO2 01/03/23 1826 99 %     Weight --      Height --      Head Circumference --      Peak Flow --      Pain Score 01/03/23 1827 3     Pain Loc --      Pain Education --      Exclude from Growth Chart --    No data found.  Updated Vital Signs BP 115/80 (BP Location: Right Arm)   Pulse 88   Temp 99.4 F (37.4 C) (Oral)   Resp 16   SpO2 99%   Visual Acuity Right Eye Distance:   Left Eye Distance:   Bilateral Distance:    Right Eye Near:   Left Eye Near:    Bilateral Near:     Physical Exam   UC Treatments / Results  Labs (all labs ordered are listed, but only abnormal results are displayed) Labs Reviewed - No data to display  EKG   Radiology No results found.  Procedures Procedures (including critical care time)  Medications Ordered in UC Medications - No data to display  Initial Impression / Assessment and Plan / UC Course  I have reviewed the triage vital signs and the nursing notes.  Pertinent labs & imaging results that were available during my care of the patient were reviewed by me and considered in my medical decision making (see chart for details).     *** Final Clinical Impressions(s) / UC Diagnoses   Final diagnoses:  None   Discharge Instructions   None    ED Prescriptions   None    PDMP not reviewed this encounter.

## 2023-01-03 NOTE — Discharge Instructions (Signed)
You have a viral upper respiratory infection.  Symptoms should improve over the next week to 10 days.  If you develop chest pain or shortness of breath, go to the emergency room.   Some things that can make you feel better are: - Increased rest - Increasing fluid with water/sugar free electrolytes - Acetaminophen and ibuprofen as needed for fever/pain - Salt water gargling, chloraseptic spray and throat lozenges - OTC guaifenesin (Mucinex) 600 mg twice daily - Saline sinus flushes or a neti pot - Humidifying the air -Tessalon Perles every 8 hours as needed for dry cough and cough syrup for cough at night time

## 2023-02-03 ENCOUNTER — Ambulatory Visit: Payer: 59 | Admitting: Allergy

## 2023-02-03 ENCOUNTER — Encounter: Payer: Self-pay | Admitting: Allergy

## 2023-02-03 VITALS — BP 120/70 | HR 72 | Temp 98.1°F | Resp 18 | Wt 137.0 lb

## 2023-02-03 DIAGNOSIS — R12 Heartburn: Secondary | ICD-10-CM

## 2023-02-03 DIAGNOSIS — J3089 Other allergic rhinitis: Secondary | ICD-10-CM

## 2023-02-03 DIAGNOSIS — T781XXD Other adverse food reactions, not elsewhere classified, subsequent encounter: Secondary | ICD-10-CM | POA: Diagnosis not present

## 2023-02-03 NOTE — Patient Instructions (Addendum)
Food  Continue to avoid mammalian meat and limit dairy intake.  Keep track of episodes. Avoid tick bites.  Get alpha-gal bloodwork sometime this year.   Environmental allergies 2023 allergy testing positive to cat and dog.  Continue environmental control measures. Use over the counter antihistamines such as Zyrtec (cetirizine), Claritin (loratadine), Allegra (fexofenadine), or Xyzal (levocetirizine) daily as needed. May take twice a day during allergy flares. May switch antihistamines every few months.  Heartburn Continue famotidine 20mg  once a day.  Follow up in 12 months or sooner if needed.

## 2023-02-03 NOTE — Progress Notes (Signed)
Follow Up Note  RE: Tonya Lee MRN: [id number] DOB: [birth year] Date of Office Visit: 02/03/2023  Referring provider: Dettinger, Elige Radon, MD Primary care provider: Dettinger, Elige Radon, MD  Chief Complaint: Food Intolerance (Alpha gal)  History of Present Illness: I had the pleasure of seeing Tonya Lee for a [facility]. She is a 38 y.o. female, who is being followed for allergic reaction, pruritic rash, allergic rhinitis. Her previous allergy office visit was on 08/03/2022 with Dr. Selena Batten. Today is a regular follow up visit.  Discussed the use of AI scribe software for clinical note transcription with the patient, who gave verbal consent to proceed.  The patient presents with recurrent episodes of gastrointestinal upset and systemic symptoms after inadvertent consumption of red meat.   In October, while attending a training event, the patient consumed breakfast burritos containing sausage for two consecutive mornings. On both days, she experienced a feeling of unwellness, with the second day being more severe, manifesting as intense nausea and an inability to eat supper. She did not vomit but felt as if she were on the verge of doing so. The patient felt better the following day after avoiding the breakfast burritos.  In December, during a Christmas breakfast, the patient inadvertently consumed sausage and bacon. That evening, she experienced vomiting. Since these episodes, the patient has been avoiding red meat and reports feeling "amazing."  The patient also reports a mild intolerance to milk products, which she manages by limiting her intake. She takes Allegra once daily for her allergies and Famotidine once daily for acid reflux. She also takes a multivitamin and a vitamin B supplement. She has an Epipen on hand for emergencies.     Assessment and Plan: Nathan is a 38 y.o. female with: Other adverse food reactions, not  elsewhere classified, subsequent encounter Past history - 2023 skin testing borderline positive to green pea and garlic. 2024 bloodwork negative to alpha gal. Interim history -  Recurrent episodes of nausea, vomiting, diarrhea after consuming red meat, despite previous negative testing. Continue to avoid mammalian meat and limit dairy intake.  Keep track of episodes. Avoid tick bites.  Get alpha-gal bloodwork sometime this year.  Repeat alpha gal bloodwork next year.   Perennial allergic rhinitis Past history - 2023 bloodwork positive to cat and dog. Interim history - controlled with allegra. Continue environmental control measures. Use over the counter antihistamines such as Zyrtec (cetirizine), Claritin (loratadine), Allegra (fexofenadine), or Xyzal (levocetirizine) daily as needed. May take twice a day during allergy flares. May switch antihistamines every few months.  Heartburn Continue famotidine 20mg  once a day.  Return in about 1 year (around 02/03/2024).  No orders of the defined types were placed in this encounter.  Lab Orders         Alpha-Gal Panel      Diagnostics: None.   Medication List:  Current Outpatient Medications  Medication Sig Dispense Refill   EPINEPHrine (EPIPEN 2-PAK) 0.3 mg/0.3 mL IJ SOAJ injection Inject 0.3mg  into the muscle once for anaphylaxis 2 each 2   famotidine (PEPCID) 20 MG tablet Take 1 tablet by mouth 2 (two) times daily.     fexofenadine (ALLEGRA) 180 MG tablet Take 180 mg by mouth daily.     Multiple Vitamin (MULTIVITAMIN ADULT PO) Multivitamin     No current facility-administered medications for this visit.   Allergies: Allergies  Allergen Reactions   Acyclovir Hives   Prednisone Nausea And  Vomiting, Other (See Comments) and Palpitations   Sulfa Antibiotics Anaphylaxis, Hives and Other (See Comments)    Throat and face swelled   Pork Allergy     GI symptoms   Metronidazole Rash   I reviewed her past medical history, social  history, family history, and environmental history and no significant changes have been reported from her previous visit.  Review of Systems  Constitutional:  Negative for appetite change, chills, fever and unexpected weight change.  HENT:  Negative for congestion and rhinorrhea.   Eyes:  Negative for itching.  Respiratory:  Negative for cough, chest tightness, shortness of breath and wheezing.   Cardiovascular:  Negative for chest pain.  Gastrointestinal:  Negative for abdominal pain.  Genitourinary:  Negative for difficulty urinating.  Skin:  Negative for rash.  Allergic/Immunologic: Positive for environmental allergies.  Neurological:  Negative for headaches.    Objective: BP 120/70   Pulse 72   Temp 98.1 F (36.7 C) (Temporal)   Resp 18   Wt 137 lb (62.1 kg)   SpO2 98%   BMI 22.80 kg/m  Body mass index is 22.8 kg/m. Physical Exam Vitals and nursing note reviewed.  Constitutional:      Appearance: Normal appearance. She is well-developed.  HENT:     Head: Normocephalic and atraumatic.     Right Ear: Tympanic membrane and external ear normal.     Left Ear: Tympanic membrane and external ear normal.     Nose: Nose normal.     Mouth/Throat:     Mouth: Mucous membranes are moist.     Pharynx: Oropharynx is clear.  Eyes:     Conjunctiva/sclera: Conjunctivae normal.  Cardiovascular:     Rate and Rhythm: Normal rate and regular rhythm.     Heart sounds: Normal heart sounds. No murmur heard.    No friction rub. No gallop.  Pulmonary:     Effort: Pulmonary effort is normal.     Breath sounds: Normal breath sounds. No wheezing, rhonchi or rales.  Musculoskeletal:     Cervical back: Neck supple.  Skin:    General: Skin is warm.     Findings: No rash.  Neurological:     Mental Status: She is alert and oriented to person, place, and time.  Psychiatric:        Behavior: Behavior normal.    Previous notes and tests were reviewed. The plan was reviewed with the  patient/family, and all questions/concerned were addressed.  It was my pleasure to see Tonya Lee today and participate in her care. Please feel free to contact me with any questions or concerns.  Sincerely,  Wyline Mood, DO Allergy & Immunology  [facility] office: [phone] [facility] office: [phone]

## 2023-02-21 ENCOUNTER — Telehealth: Payer: Self-pay

## 2023-02-21 ENCOUNTER — Ambulatory Visit: Payer: 59 | Admitting: Family Medicine

## 2023-02-21 ENCOUNTER — Encounter: Payer: Self-pay | Admitting: Family Medicine

## 2023-02-21 ENCOUNTER — Telehealth (INDEPENDENT_AMBULATORY_CARE_PROVIDER_SITE_OTHER): Payer: 59 | Admitting: Family Medicine

## 2023-02-21 DIAGNOSIS — R6889 Other general symptoms and signs: Secondary | ICD-10-CM

## 2023-02-21 DIAGNOSIS — R059 Cough, unspecified: Secondary | ICD-10-CM | POA: Diagnosis not present

## 2023-02-21 DIAGNOSIS — Z20828 Contact with and (suspected) exposure to other viral communicable diseases: Secondary | ICD-10-CM | POA: Diagnosis not present

## 2023-02-21 DIAGNOSIS — M791 Myalgia, unspecified site: Secondary | ICD-10-CM | POA: Diagnosis not present

## 2023-02-21 MED ORDER — PROMETHAZINE-DM 6.25-15 MG/5ML PO SYRP
2.5000 mL | ORAL_SOLUTION | Freq: Four times a day (QID) | ORAL | 0 refills | Status: DC | PRN
Start: 2023-02-21 — End: 2023-06-16

## 2023-02-21 MED ORDER — OSELTAMIVIR PHOSPHATE 75 MG PO CAPS: 75.0000 mg | ORAL_CAPSULE | Freq: Two times a day (BID) | ORAL | 0 refills | Status: DC

## 2023-02-21 MED ORDER — BENZONATATE 100 MG PO CAPS: 100.0000 mg | ORAL_CAPSULE | Freq: Three times a day (TID) | ORAL | 0 refills | Status: DC | PRN

## 2023-02-21 NOTE — Telephone Encounter (Signed)
 Appt scheduled for 02/21/23 with DOD

## 2023-02-21 NOTE — Telephone Encounter (Signed)
 NTBS      Copied from CRM [id number]. Topic: Clinical - Medication Question >> Feb 21, 2023 10:03 AM Pattie Borders B wrote: Reason for CRM: Patient was exposed to family members with Flu-A over weekend, now has sore throat and congestion, fatigue. Would like to know if can get RX for Tamiflu  or if needs to be seen. [phone]

## 2023-02-21 NOTE — Progress Notes (Signed)
 MyChart Video visit  Subjective: CC:flu PCP: Dettinger, Lucio Sabin, MD RUE:[id number] Tonya Lee is a 38 y.o. female. Patient provides verbal consent for consult held via video.  Due to COVID-19 pandemic this visit was conducted virtually. This visit type was conducted due to national recommendations for restrictions regarding the COVID-19 Pandemic (e.g. social distancing, sheltering in place) in an effort to limit this patient's exposure and mitigate transmission in our community. All issues noted in this document were discussed and addressed.  A physical exam was not performed with this format.   Location of patient: home Location of provider: [facility] Others present for call: children/ spouse  1. Flu She started having myalgia 2 days ago.  She was exposed by her aunt last week, who sadly passed from complications.  He spouse started having symptoms last week.  She was seen at the urgent care yesterday and tested negative for flu.  She was instructed to follow-up with PCP for repeat testing if she continued to have symptomology.  She has been coughing a lot.   No hemoptysis.  No shortness of breath or wheezing.  She reports post nasal drainage.  She reports sinus pressure.  No measured fevers. Having chills. LMP: IUD in place.  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)    GERD (gastroesophageal reflux disease)    Migraines    Peptic ulcer disease     Current Outpatient Medications:    EPINEPHrine  (EPIPEN  2-PAK) 0.3 mg/0.3 mL IJ SOAJ injection, Inject 0.3mg  into the muscle once for anaphylaxis, Disp: 2 each, Rfl: 2   famotidine  (PEPCID ) 20 MG tablet, Take 1 tablet by  mouth 2 (two) times daily., Disp: , Rfl:    fexofenadine  (ALLEGRA) 180 MG tablet, Take 180 mg by mouth daily., Disp: , Rfl:    Multiple Vitamin (MULTIVITAMIN ADULT PO), Multivitamin, Disp: , Rfl:   Gen: Nontoxic but ill-appearing female no acute distress Pulm: Normal work of breathing on room air.  No observed coughing.  No observed shortness of breath with speech. Neuro: Alert and oriented  Assessment/ Plan: 38 y.o. female   Flu-like symptoms - Plan: oseltamivir  (TAMIFLU ) 75 MG capsule, benzonatate  (TESSALON  PERLES) 100 MG capsule, promethazine -dextromethorphan (PROMETHAZINE -DM) 6.25-15 MG/5ML syrup  Exposure to the flu - Plan: oseltamivir  (TAMIFLU ) 75 MG capsule, benzonatate  (TESSALON  PERLES) 100 MG capsule, promethazine -dextromethorphan (PROMETHAZINE -DM) 6.25-15 MG/5ML syrup  Push oral fluids.  Going to treat empirically for presumed influenza.  Discussed potential side effects of Tamiflu  including nausea.  Tessalon  Perles and promethazine  with dextromethorphan sent for cough.  Home care instructions were reviewed and reasons for reevaluation in person discussed with patient.  Work note provided.  Follow-up as needed  Start time: 10:59am End time: 11:07a  Total time spent on patient care (including video visit/ documentation): 11 minutes  Dontrel Smethers Bambi Bonine, DO [facility] [phone]

## 2023-02-22 ENCOUNTER — Encounter: Payer: Self-pay | Admitting: Family Medicine

## 2023-02-22 ENCOUNTER — Ambulatory Visit: Payer: Self-pay | Admitting: Family Medicine

## 2023-02-22 ENCOUNTER — Other Ambulatory Visit: Payer: Self-pay | Admitting: Family Medicine

## 2023-02-22 ENCOUNTER — Telehealth: Payer: Self-pay | Admitting: Family Medicine

## 2023-02-22 DIAGNOSIS — R6889 Other general symptoms and signs: Secondary | ICD-10-CM

## 2023-02-22 MED ORDER — OSELTAMIVIR PHOSPHATE 6 MG/ML PO SUSR: 75.0000 mg | Freq: Two times a day (BID) | ORAL | 0 refills | Status: DC

## 2023-02-22 MED ORDER — OSELTAMIVIR PHOSPHATE 6 MG/ML PO SUSR: 75.0000 mg | Freq: Two times a day (BID) | ORAL | 0 refills | Status: AC

## 2023-02-22 NOTE — Telephone Encounter (Signed)
Pt states she was prescribed Tamiflu. Pt states she took it yesterday for the first time and immediately vomited 2x after. Pt states she has a beef allergy and believes there may be beef products in the capsule. Pt inquiring about whether she can be prescribed a liquid form of the med. No hives, no itching, no SOB or airway involvement. Pt states she 'felt fine' after vomiting after taking the med. Pt states her flu symptoms are mostly unchanged from yesterday, although she does endorse seeing bright red tinges of blood mixed in with yellow mucus. States the doctor wanted her to let him know if she notices any blood in her mucus. RN advised pt she will send over concern about the med allergy and the blood tinged mucus for follow-up.  Copied from CRM [id number]. Topic: Clinical - Red Word Triage >> Feb 22, 2023  8:54 AM Fonda Kinder J wrote: Red Word that prompted transfer to Nurse Triage: PT is having an allergic reaction to the Tamiflu and wants to know if she can switch to the liquid form of the medication Reason for Disposition  Caller wants to use a complementary or alternative medicine  Answer Assessment - Initial Assessment Questions 1. NAME of MEDICINE: "What medicine(s) are you calling about?"     Tamiflu 2. QUESTION: "What is your question?" (e.g., double dose of medicine, side effect)     Pt states she vomited after taking Tamiflu yesterday. Vomited 2x and felt fine afterwards 3. PRESCRIBER: "Who prescribed the medicine?" Reason: if prescribed by specialist, call should be referred to that group.     [place] 4. SYMPTOMS: "Do you have any symptoms?" If Yes, ask: "What symptoms are you having?"  "How bad are the symptoms (e.g., mild, moderate, severe)     States she is having blood in her mucus today, only symptom that has changed since yesterday. States her doctor wanted her to alert him if she saw any blood in her mucus. Streaks of bright red blood mixed with yellow mucus.  Protocols used:  Medication Question Call-A-AH

## 2023-02-22 NOTE — Telephone Encounter (Signed)
She will need to call around to find the med. There are sadly no alternatives to tamiflu.

## 2023-02-22 NOTE — Telephone Encounter (Signed)
Pt message sent

## 2023-02-22 NOTE — Telephone Encounter (Signed)
Liquid tamiflu sent to pharmacy  Meds ordered this encounter  Medications   oseltamivir (TAMIFLU) 6 MG/ML SUSR suspension    Sig: Take 12.5 mLs (75 mg total) by mouth 2 (two) times daily.    Dispense:  125 mL    Refill:  0    Supervising Provider:   Arville Care A [[id number]]   Mary-Margaret Daphine Deutscher, FNP

## 2023-02-22 NOTE — Addendum Note (Signed)
Addended by: Bennie Pierini on: 02/22/2023 11:03 AM   Modules accepted: Orders

## 2023-02-22 NOTE — Telephone Encounter (Signed)
Patient notified and verbalized understanding.

## 2023-02-22 NOTE — Telephone Encounter (Signed)
Can pt try liquid form if she has a beef allergy?

## 2023-02-22 NOTE — Telephone Encounter (Signed)
This RN attempted to contact [facility] in [place] to see if prescription can be forwarded to alternate location, unable to get past automated service to speak with staff.  Alerting PCP for review and f/u.  Copied from CRM [id number]. Topic: Clinical - Prescription Issue >> Feb 22, 2023  2:14 PM Clayton Bibles wrote: Reason for CRM: [facility] [address] [facility] [place] Is out of promethazine-dextromethorphan (PROMETHAZINE-DM) 6.25-15 MG/5ML syrup AND oseltamivir (TAMIFLU) 6 MG/ML SUSR suspension  Please resend to [facility] [place]  - [address] [place] - they have it in stock

## 2023-06-03 ENCOUNTER — Ambulatory Visit: Payer: Self-pay | Admitting: Allergy

## 2023-06-03 LAB — ALPHA-GAL PANEL
Allergen Lamb IgE: <0.1 kU/L
Beef IgE: <0.1 kU/L
IgE (Immunoglobulin E), Serum: 12 [IU]/mL (ref 6–495)
O215-IgE Alpha-Gal: 0.13 kU/L — AB
Pork IgE: <0.1 kU/L

## 2023-06-16 ENCOUNTER — Ambulatory Visit: Admitting: Family Medicine

## 2023-06-16 ENCOUNTER — Encounter: Payer: Self-pay | Admitting: Family Medicine

## 2023-06-16 VITALS — BP 113/79 | HR 69 | Temp 98.0°F | Ht 65.0 in | Wt 125.6 lb

## 2023-06-16 DIAGNOSIS — L282 Other prurigo: Secondary | ICD-10-CM | POA: Diagnosis not present

## 2023-06-16 MED ORDER — METHYLPREDNISOLONE ACETATE 40 MG/ML IJ SUSP
40.0000 mg | Freq: Once | INTRAMUSCULAR | Status: AC
  Administered 2023-06-16: 60 mg via INTRAMUSCULAR

## 2023-06-16 MED ORDER — PREDNISONE 20 MG PO TABS: 40.0000 mg | ORAL_TABLET | Freq: Every day | ORAL | 0 refills | Status: AC

## 2023-06-16 NOTE — Progress Notes (Signed)
 Subjective:  Patient ID: Tonya Lee, female    DOB: Aug 01, 1985, 38 y.o.   MRN: [id number]  Patient Care Team: Dettinger, Lucio Sabin, MD as PCP - General (Family Medicine)   Chief Complaint:  Rash (Bilateral arms x 1 day )   HPI: Tonya Lee is a 38 y.o. female presenting on 06/16/2023 for Rash (Bilateral arms x 1 day )  Tonya Lee is a 38 year old female with alpha-gal syndrome who presents with a sudden onset rash.  Yesterday at around 3 PM, she experienced a sudden onset of a rash, described as 'randomly breaking out'. She has been unable to identify any specific trigger or exposure that could have caused it. Despite her history of alpha-gal syndrome, she did not engage in any activities or use any products out of the ordinary.  She has attempted various treatments including calamine lotion, prescription strength hydrocortisone cream, Benadryl , prednisone , Allegra, and an oatmeal bath soak, but reports no improvement. The rash has significantly impacted her sleep, prompting her to seek medical attention the following morning.  She regularly uses Neutrogena sunscreen, which she has used for a long time without issues, but is uncertain if it could be contributing to her symptoms. No new products or exposures have been identified.  Her current medications include Pepcid  and Allegra, which she continues to use to manage her symptoms. She reports a previous mild reaction to prednisone , described as a headache, but denies any significant allergy  to it.          Relevant past medical, surgical, family, and social history reviewed and updated as indicated.  Allergies and medications reviewed and updated. Data reviewed: Chart in Epic.   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)    GERD (gastroesophageal reflux disease)    Migraines    Peptic ulcer disease     Past Surgical History:  Procedure Laterality Date   ANKLE  SURGERY Right july 2013   FASCIECTOMY Right    right lower leg   KNEE SURGERY      Social History   Socioeconomic History   Marital status: Married    Spouse name: Not on file   Number of children: Not on file   Years of education: Not on file   Highest education level: Not on file  Occupational History   Not on file  Tobacco Use   Smoking status: Never   Smokeless tobacco: Never  Vaping Use   Vaping status: Never Used  Substance and Sexual Activity   Alcohol use: No   Drug use: No   Sexual activity: Yes    Birth control/protection: I.U.D.  Other Topics Concern   Not on file  Social History Narrative   Not on file   Social Drivers of Health   Financial Resource Strain: Low Risk  (12/13/2019)   Received from [facility], [facility]   Overall Financial Resource Strain (CARDIA)    Difficulty of Paying Living Expenses: Not hard at all  Food Insecurity: No Food Insecurity (02/07/2020)   Received from [facility], [facility]   Hunger Vital Sign    Worried About Running Out of Food in the Last Year: Never true    Ran Out of Food in the Last Year: Never true  Transportation Needs: Not on file  Physical Activity: Not on file  Stress: No Stress Concern Present (12/13/2019)   Received from [facility], [facility] of  Occupational Health - Occupational Stress Questionnaire    Feeling of Stress : Not at all  Social Connections: Unknown (05/14/2021)   Received from [facility]   Social Network    Social Network: Not on file  Intimate Partner Violence: Unknown (04/17/2021)   Received from [facility], [facility]   HITS    Physically Hurt: Not on file    Insult or Talk Down To: Not on file    Threaten Physical Harm: Not on file    Scream or Curse: Not on file    Outpatient Encounter Medications as of 06/16/2023  Medication Sig   EPINEPHrine  (EPIPEN  2-PAK) 0.3 mg/0.3 mL IJ SOAJ injection Inject 0.3mg  into the muscle once for  anaphylaxis   famotidine  (PEPCID ) 20 MG tablet Take 1 tablet by mouth 2 (two) times daily.   fexofenadine  (ALLEGRA) 180 MG tablet Take 180 mg by mouth daily.   Multiple Vitamin (MULTIVITAMIN ADULT PO) Multivitamin   predniSONE  (DELTASONE ) 20 MG tablet Take 2 tablets (40 mg total) by mouth daily with breakfast for 5 days.   [DISCONTINUED] benzonatate  (TESSALON  PERLES) 100 MG capsule Take 1 capsule (100 mg total) by mouth 3 (three) times daily as needed.   [DISCONTINUED] promethazine -dextromethorphan (PROMETHAZINE -DM) 6.25-15 MG/5ML syrup Take 2.5 mLs by mouth 4 (four) times daily as needed.   [EXPIRED] methylPREDNISolone  acetate (DEPO-MEDROL ) injection 40 mg    No facility-administered encounter medications on file as of 06/16/2023.    Allergies  Allergen Reactions   Acyclovir Hives   Sulfa Antibiotics Anaphylaxis, Hives and Other (See Comments)    Throat and face swelled   Pork Allergy      GI symptoms   Metronidazole  Rash    Pertinent ROS per HPI, otherwise unremarkable      Objective:  BP 113/79   Pulse 69   Temp 98 F (36.7 C)   Ht 5\' 5"  (1.651 m)   Wt 125 lb 9.6 oz (57 kg)   SpO2 100%   BMI 20.90 kg/m    Wt Readings from Last 3 Encounters:  06/16/23 125 lb 9.6 oz (57 kg)  02/03/23 137 lb (62.1 kg)  08/03/22 128 lb (58.1 kg)    Physical Exam Vitals and nursing note reviewed.  Constitutional:      General: She is not in acute distress.    Appearance: Normal appearance. She is normal weight. She is not ill-appearing, toxic-appearing or diaphoretic.  HENT:     Head: Normocephalic and atraumatic.     Nose: Nose normal.     Mouth/Throat:     Mouth: Mucous membranes are moist.  Eyes:     Conjunctiva/sclera: Conjunctivae normal.     Pupils: Pupils are equal, round, and reactive to light.  Cardiovascular:     Rate and Rhythm: Normal rate and regular rhythm.  Pulmonary:     Effort: Pulmonary effort is normal.     Breath sounds: Normal breath sounds. No wheezing.   Musculoskeletal:     Right lower leg: No edema.     Left lower leg: No edema.  Skin:    General: Skin is warm and dry.     Capillary Refill: Capillary refill takes less than 2 seconds.     Findings: Erythema and rash present. Rash is macular and papular.       Neurological:     General: No focal deficit present.     Mental Status: She is alert and oriented to person, place, and time.  Psychiatric:  Mood and Affect: Mood normal.        Behavior: Behavior normal.        Thought Content: Thought content normal.        Judgment: Judgment normal.     Results for orders placed or performed in visit on 03/02/23  HM COLONOSCOPY   Collection Time: 05/25/22  8:21 AM  Result Value Ref Range   HM Colonoscopy See Report (in chart) See Report (in chart), Patient Reported       Pertinent labs & imaging results that were available during my care of the patient were reviewed by me and considered in my medical decision making.  Assessment & Plan:  Tonya Lee was seen today for rash.  Diagnoses and all orders for this visit:  Pruritic rash -     predniSONE  (DELTASONE ) 20 MG tablet; Take 2 tablets (40 mg total) by mouth daily with breakfast for 5 days. -     methylPREDNISolone  acetate (DEPO-MEDROL ) injection 40 mg      Contact Dermatitis Acute onset of rash and pruritus began yesterday at 3 PM. No new products or exposures identified except for Neutrogena sunscreen, previously used without issues. Possible contact or solar dermatitis associated with Neutrogena sunscreen, as seen in other patients. Multiple over-the-counter and prescription treatments have been ineffective. She denies significant adverse reactions to prednisone , despite a previous mild headache. - Administer Depo Medrol  injection for rapid relief. - Prescribe prednisone  pack if symptoms persist post-injection. Dosage: 40 mg, two pills daily for five days. - Continue Pepcid  and Allegra to mitigate reaction. - Recommend  Equate eczema cream (2% colloidal oatmeal) from [employer] to aid skin barrier healing. - Advise switching to Elta MD or La Roche-Posay sunscreens as alternatives to Neutrogena.       Continue all other maintenance medications.  Follow up plan: Return if symptoms worsen or fail to improve.   The above assessment and management plan was discussed with the patient. The patient verbalized understanding of and has agreed to the management plan. Patient is aware to call the clinic if they develop any new symptoms or if symptoms persist or worsen. Patient is aware when to return to the clinic for a follow-up visit. Patient educated on when it is appropriate to go to the emergency department.   Kattie Parrot, FNP-C [facility] [phone]

## 2023-07-20 ENCOUNTER — Other Ambulatory Visit: Payer: Self-pay | Admitting: Obstetrics and Gynecology

## 2023-07-20 DIAGNOSIS — R928 Other abnormal and inconclusive findings on diagnostic imaging of breast: Secondary | ICD-10-CM

## 2023-07-27 ENCOUNTER — Ambulatory Visit
Admission: RE | Admit: 2023-07-27 | Discharge: 2023-07-27 | Disposition: A | Discharge status: Home / Self Care | Payer: Self-pay | Source: Ambulatory Visit | Attending: Obstetrics and Gynecology | Admitting: Obstetrics and Gynecology

## 2023-07-27 DIAGNOSIS — R928 Other abnormal and inconclusive findings on diagnostic imaging of breast: Secondary | ICD-10-CM

## 2023-07-28 ENCOUNTER — Ambulatory Visit
Admission: EM | Admit: 2023-07-28 | Discharge: 2023-07-28 | Disposition: A | Discharge status: Home / Self Care | Attending: Family Medicine | Admitting: Family Medicine

## 2023-07-28 ENCOUNTER — Encounter: Payer: Self-pay | Admitting: Emergency Medicine

## 2023-07-28 ENCOUNTER — Other Ambulatory Visit: Payer: Self-pay

## 2023-07-28 DIAGNOSIS — J4521 Mild intermittent asthma with (acute) exacerbation: Secondary | ICD-10-CM

## 2023-07-28 DIAGNOSIS — J3089 Other allergic rhinitis: Secondary | ICD-10-CM | POA: Diagnosis not present

## 2023-07-28 MED ORDER — ALBUTEROL SULFATE HFA 108 (90 BASE) MCG/ACT IN AERS
2.0000 | INHALATION_SPRAY | RESPIRATORY_TRACT | 0 refills | Status: AC | PRN
Start: 2023-07-28 — End: ?

## 2023-07-28 MED ORDER — DEXAMETHASONE SODIUM PHOSPHATE 10 MG/ML IJ SOLN
10.0000 mg | Freq: Once | INTRAMUSCULAR | Status: AC
  Administered 2023-07-28: 10 mg via INTRAMUSCULAR

## 2023-07-28 NOTE — ED Provider Notes (Signed)
 [facility]    CSN: [id number] Arrival date & time: 07/28/23  0834      History   Chief Complaint Chief Complaint  Patient presents with   Allergic Reaction    HPI Tonya Lee is a 38 y.o. female.   Patient presenting today with 6-day history of sinus pressure, congestion, sore throat, chest tightness, cough that started after cleaning out the air purifier which kicked up a bunch of dust while emptying it.  Denies fever, chills, body aches, abdominal pain, nausea vomiting or diarrhea.  Taking Mucinex , Allegra-D, Pepcid , albuterol  as needed for asthma with minimal relief.  History of seasonal allergies and asthma.    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)    GERD (gastroesophageal reflux disease)    Migraines    Peptic ulcer disease     Patient Active Problem List   Diagnosis Date Noted   Allergic reaction 04/01/2022   Pruritic rash 08/14/2021   Perennial allergic rhinitis 08/14/2021   History of asthma 08/14/2021   Mild persistent asthma with acute exacerbation 07/07/2018   Migraine with aura and without status migrainosus, not intractable 10/11/2017   S/P right knee arthroscopy 04/19/2017   PUD (peptic ulcer disease) 04/18/2017   Colitis 04/16/2017   GERD (gastroesophageal reflux disease) 02/28/2015    Past Surgical History:  Procedure Laterality Date   ANKLE SURGERY Right july 2013   FASCIECTOMY Right    right lower leg   KNEE SURGERY      OB History     Gravida  1   Para      Term      Preterm      AB      Living         SAB      IAB      Ectopic      Multiple      Live Births               Home Medications    Prior to Admission medications   Medication Sig Start Date End Date Taking? Authorizing Provider  albuterol  (VENTOLIN  HFA) 108 (90 Base) MCG/ACT inhaler Inhale 2 puffs into the lungs every 4 (four) hours as needed. 07/28/23  Yes Stuart Vernell Norris,  PA-C  EPINEPHrine  (EPIPEN  2-PAK) 0.3 mg/0.3 mL IJ SOAJ injection Inject 0.3mg  into the muscle once for anaphylaxis 03/30/22   Luke Orlan HERO, DO  famotidine  (PEPCID ) 20 MG tablet Take 1 tablet by mouth 2 (two) times daily.    [provider]  fexofenadine  (ALLEGRA) 180 MG tablet Take 180 mg by mouth daily.    [provider]  Multiple Vitamin (MULTIVITAMIN ADULT PO) Multivitamin    [provider]    Family History Family History  Problem Relation Age of Onset   Asthma Mother    Hyperlipidemia Maternal Grandmother    Hypertension Maternal Grandmother    Asthma Maternal Grandmother    Heart disease Maternal Grandfather    Cancer Maternal Grandfather        thyroid    Diabetes Maternal Grandfather    Heart disease Paternal Grandfather     Social History Social History   Tobacco Use   Smoking status: Never   Smokeless tobacco: Never  Vaping Use   Vaping status: Never Used  Substance Use Topics   Alcohol use: No   Drug use: No     Allergies   Acyclovir, Sulfa antibiotics, Flomax  [tamsulosin ],  Pork allergy , and Metronidazole    Review of Systems Review of Systems Per HPI  Physical Exam Triage Vital Signs ED Triage Vitals  Encounter Vitals Group     BP 07/28/23 0846 123/84     Girls Systolic BP Percentile --      Girls Diastolic BP Percentile --      Boys Systolic BP Percentile --      Boys Diastolic BP Percentile --      Pulse Rate 07/28/23 0846 74     Resp 07/28/23 0846 20     Temp 07/28/23 0846 98.2 F (36.8 C)     Temp Source 07/28/23 0846 Oral     SpO2 07/28/23 0846 99 %     Weight --      Height --      Head Circumference --      Peak Flow --      Pain Score 07/28/23 0844 2     Pain Loc --      Pain Education --      Exclude from Growth Chart --    No data found.  Updated Vital Signs BP 123/84 (BP Location: Right Arm)   Pulse 74   Temp 98.2 F (36.8 C) (Oral)   Resp 20   SpO2 99%   Visual Acuity Right Eye Distance:    Left Eye Distance:   Bilateral Distance:    Right Eye Near:   Left Eye Near:    Bilateral Near:     Physical Exam Vitals and nursing note reviewed.  Constitutional:      Appearance: Normal appearance.  HENT:     Head: Atraumatic.     Right Ear: Tympanic membrane and external ear normal.     Left Ear: Tympanic membrane and external ear normal.     Nose: Rhinorrhea present.     Mouth/Throat:     Mouth: Mucous membranes are moist.     Pharynx: Posterior oropharyngeal erythema present.  Eyes:     Extraocular Movements: Extraocular movements intact.     Conjunctiva/sclera: Conjunctivae normal.  Cardiovascular:     Rate and Rhythm: Normal rate and regular rhythm.     Heart sounds: Normal heart sounds.  Pulmonary:     Effort: Pulmonary effort is normal.     Breath sounds: Normal breath sounds. No wheezing or rales.  Musculoskeletal:        General: Normal range of motion.     Cervical back: Normal range of motion and neck supple.  Skin:    General: Skin is warm and dry.  Neurological:     Mental Status: She is alert and oriented to person, place, and time.  Psychiatric:        Mood and Affect: Mood normal.        Thought Content: Thought content normal.      UC Treatments / Results  Labs (all labs ordered are listed, but only abnormal results are displayed) Labs Reviewed - No data to display  EKG   Radiology US  LIMITED ULTRASOUND INCLUDING AXILLA LEFT BREAST  Result Date: 07/27/2023 CLINICAL DATA:  Recall from screening for possible left breast masses. EXAM: ULTRASOUND OF THE LEFT BREAST COMPARISON:  Previous exam(s). FINDINGS: Targeted left breast ultrasound is performed, showing to cyst corresponding to the screening mammographic masses. At 2 o'clock, 1 cm from the nipple, there is a simple cyst measuring 1.0 x 0.4 x 0.8 cm. In the 2 o'clock retroareolar region there is a cluster of cysts measuring 0.9  x 0.4 x 0.7 cm. No solid masses or suspicious findings.  IMPRESSION: 1. No evidence of breast malignancy. 2. Benign left breast cysts account for the screening mammographic masses. RECOMMENDATION: 1. Screening mammogram at age 28 unless there are persistent or intervening clinical concerns. (Code:SM-B-40A). I have discussed the findings and recommendations with the patient. If applicable, a reminder letter will be sent to the patient regarding the next appointment. BI-RADS CATEGORY  2: Benign. Electronically Signed   By: Alm Parkins M.D.   On: 07/27/2023 08:58    Procedures Procedures (including critical care time)  Medications Ordered in UC Medications  dexamethasone  (DECADRON ) injection 10 mg (10 mg Intramuscular Given 07/28/23 0917)    Initial Impression / Assessment and Plan / UC Course  I have reviewed the triage vital signs and the nursing notes.  Pertinent labs & imaging results that were available during my care of the patient were reviewed by me and considered in my medical decision making (see chart for details).     Vitals within normal limits, exam consistent with seasonal allergy  exacerbation and asthma exacerbation.  Treat with IM Decadron , albuterol  as needed, antihistamines, supportive over-the-counter medications at home care.  Return for worsening symptoms.  Final Clinical Impressions(s) / UC Diagnoses   Final diagnoses:  Seasonal allergic rhinitis due to other allergic trigger  Mild intermittent asthma with acute exacerbation   Discharge Instructions   None    ED Prescriptions     Medication Sig Dispense Auth. Provider   albuterol  (VENTOLIN  HFA) 108 (90 Base) MCG/ACT inhaler Inhale 2 puffs into the lungs every 4 (four) hours as needed. 18 g Stuart Vernell Norris, [place]      PDMP not reviewed this encounter.   Stuart Vernell Norris, [place] 07/28/23 [phone]

## 2023-07-28 NOTE — ED Triage Notes (Addendum)
 Pt reports was deep cleaning on Saturday and reports air purifier kicked up dust while trying to empty it over a vent. Pt reports cough, sore throat, and nasal congestion ever since. Pt reports allergy  to dust, dander, etc. is currently taking mucinex , allegra D, pepcid  with no change in symptoms.

## 2023-09-28 ENCOUNTER — Other Ambulatory Visit (HOSPITAL_COMMUNITY): Payer: Self-pay | Admitting: Orthopedic Surgery

## 2023-09-28 DIAGNOSIS — M25561 Pain in right knee: Secondary | ICD-10-CM

## 2023-10-02 ENCOUNTER — Ambulatory Visit (HOSPITAL_COMMUNITY)
Admission: RE | Admit: 2023-10-02 | Discharge: 2023-10-02 | Disposition: A | Discharge status: Home / Self Care | Source: Ambulatory Visit | Attending: Orthopedic Surgery | Admitting: Orthopedic Surgery

## 2023-10-02 DIAGNOSIS — M25561 Pain in right knee: Secondary | ICD-10-CM | POA: Insufficient documentation

## 2024-01-16 ENCOUNTER — Ambulatory Visit
Admission: EM | Admit: 2024-01-16 | Discharge: 2024-01-16 | Disposition: A | Discharge status: Home / Self Care | Attending: Family Medicine | Admitting: Family Medicine

## 2024-01-16 DIAGNOSIS — B9789 Other viral agents as the cause of diseases classified elsewhere: Secondary | ICD-10-CM | POA: Diagnosis not present

## 2024-01-16 DIAGNOSIS — J3089 Other allergic rhinitis: Secondary | ICD-10-CM

## 2024-01-16 DIAGNOSIS — J329 Chronic sinusitis, unspecified: Secondary | ICD-10-CM | POA: Diagnosis not present

## 2024-01-16 MED ORDER — PREDNISONE 20 MG PO TABS: 40.0000 mg | ORAL_TABLET | Freq: Every day | ORAL | 0 refills | Status: DC

## 2024-01-16 MED ORDER — PROMETHAZINE-DM 6.25-15 MG/5ML PO SYRP: 5.0000 mL | ORAL_SOLUTION | Freq: Four times a day (QID) | ORAL | 0 refills | Status: DC | PRN

## 2024-01-16 NOTE — ED Provider Notes (Signed)
 " [facility]    CSN: [id number] Arrival date & time: 01/16/24  9147      History   Chief Complaint No chief complaint on file.   HPI Tonya Lee is a 39 y.o. female.   Patient presenting today with about a week of lingering congestion, cough, sore throat, headache, ear pressure, sinus pressure.  Denies fever, chills, chest pain, abdominal pain, vomiting, diarrhea.  So far trying over-the-counter cold and congestion medication with minimal relief.  History of seasonal allergies and asthma on.  Regimen for both.    Past Medical History:  Diagnosis Date   Compartment syndrome of right lower extremity    GERD (gastroesophageal reflux disease)    Migraines    Peptic ulcer disease     Patient Active Problem List   Diagnosis Date Noted   Allergic reaction 04/01/2022   Pruritic rash 08/14/2021   Perennial allergic rhinitis 08/14/2021   History of asthma 08/14/2021   Mild persistent asthma with acute exacerbation 07/07/2018   Migraine with aura and without status migrainosus, not intractable 10/11/2017   S/P right knee arthroscopy 04/19/2017   PUD (peptic ulcer disease) 04/18/2017   Colitis 04/16/2017   GERD (gastroesophageal reflux disease) 02/28/2015    Past Surgical History:  Procedure Laterality Date   ANKLE SURGERY Right july 2013   FASCIECTOMY Right    right lower leg   KNEE SURGERY      OB History     Gravida  1   Para      Term      Preterm      AB      Living         SAB      IAB      Ectopic      Multiple      Live Births               Home Medications    Prior to Admission medications  Medication Sig Start Date End Date Taking? Authorizing Provider  predniSONE  (DELTASONE ) 20 MG tablet Take 2 tablets (40 mg total) by mouth daily with breakfast. 01/16/24  Yes Stuart Vernell Norris, PA-C  promethazine -dextromethorphan (PROMETHAZINE -DM) 6.25-15 MG/5ML syrup Take 5 mLs by mouth 4 (four) times daily as needed. 01/16/24  Yes  Stuart Vernell Norris, PA-C  albuterol  (VENTOLIN  HFA) 108 (90 Base) MCG/ACT inhaler Inhale 2 puffs into the lungs every 4 (four) hours as needed. 07/28/23   Stuart Vernell Norris, PA-C  EPINEPHrine  (EPIPEN  2-PAK) 0.3 mg/0.3 mL IJ SOAJ injection Inject 0.3mg  into the muscle once for anaphylaxis 03/30/22   Luke Orlan HERO, DO  famotidine  (PEPCID ) 20 MG tablet Take 1 tablet by mouth 2 (two) times daily.    [provider]  fexofenadine  (ALLEGRA) 180 MG tablet Take 180 mg by mouth daily.    [provider]  Multiple Vitamin (MULTIVITAMIN ADULT PO) Multivitamin    [provider]    Family History Family History  Problem Relation Age of Onset   Asthma Mother    Hyperlipidemia Maternal Grandmother    Hypertension Maternal Grandmother    Asthma Maternal Grandmother    Heart disease Maternal Grandfather    Cancer Maternal Grandfather        thyroid    Diabetes Maternal Grandfather    Heart disease Paternal Grandfather     Social History Social History[1]   Allergies   Acyclovir, Sulfa antibiotics, Flomax  [tamsulosin ], and Pork allergy    Review of Systems Review of Systems  PER HPI  Physical Exam Triage Vital Signs ED Triage Vitals  Encounter Vitals Group     BP 01/16/24 0946 117/81     Girls Systolic BP Percentile --      Girls Diastolic BP Percentile --      Boys Systolic BP Percentile --      Boys Diastolic BP Percentile --      Pulse Rate 01/16/24 0946 70     Resp 01/16/24 0946 18     Temp 01/16/24 0946 98.6 F (37 C)     Temp Source 01/16/24 0946 Oral     SpO2 01/16/24 0946 98 %     Weight --      Height --      Head Circumference --      Peak Flow --      Pain Score 01/16/24 0947 3     Pain Loc --      Pain Education --      Exclude from Growth Chart --    No data found.  Updated Vital Signs BP 117/81 (BP Location: Right Arm)   Pulse 70   Temp 98.6 F (37 C) (Oral)   Resp 18   SpO2 98%   Visual Acuity Right Eye Distance:   Left  Eye Distance:   Bilateral Distance:    Right Eye Near:   Left Eye Near:    Bilateral Near:     Physical Exam Vitals and nursing note reviewed.  Constitutional:      Appearance: Normal appearance.  HENT:     Head: Atraumatic.     Right Ear: Tympanic membrane and external ear normal.     Left Ear: Tympanic membrane and external ear normal.     Nose: Rhinorrhea present.     Mouth/Throat:     Mouth: Mucous membranes are moist.     Pharynx: Posterior oropharyngeal erythema present.  Eyes:     Extraocular Movements: Extraocular movements intact.     Conjunctiva/sclera: Conjunctivae normal.  Cardiovascular:     Rate and Rhythm: Normal rate and regular rhythm.     Heart sounds: Normal heart sounds.  Pulmonary:     Effort: Pulmonary effort is normal.     Breath sounds: Normal breath sounds. No wheezing or rales.  Musculoskeletal:        General: Normal range of motion.     Cervical back: Normal range of motion and neck supple.  Skin:    General: Skin is warm and dry.  Neurological:     Mental Status: She is alert and oriented to person, place, and time.  Psychiatric:        Mood and Affect: Mood normal.        Thought Content: Thought content normal.      UC Treatments / Results  Labs (all labs ordered are listed, but only abnormal results are displayed) Labs Reviewed - No data to display  EKG   Radiology No results found.  Procedures Procedures (including critical care time)  Medications Ordered in UC Medications - No data to display  Initial Impression / Assessment and Plan / UC Course  I have reviewed the triage vital signs and the nursing notes.  Pertinent labs & imaging results that were available during my care of the patient were reviewed by me and considered in my medical decision making (see chart for details).     Consistent with viral sinusitis and seasonal allergy  exacerbation.  Treat with prednisone , Phenergan  DM, supportive over-the-counter  medications and home care.  Vitals and exam reassuring today.  She is well-appearing and in no acute distress. Final Clinical Impressions(s) / UC Diagnoses   Final diagnoses:  Viral sinusitis  Seasonal allergic rhinitis due to other allergic trigger     Discharge Instructions      In addition to the prescribed medications, use Flonase  and/or Astelin nasal spray twice daily, over-the-counter cold and congestion medications as needed, antihistamines.  Continue the albuterol  every 4 hours as needed.  Return for worsening or unresolving symptoms.    ED Prescriptions     Medication Sig Dispense Auth. Provider   predniSONE  (DELTASONE ) 20 MG tablet Take 2 tablets (40 mg total) by mouth daily with breakfast. 10 tablet Stuart Vernell Norris, PA-C   promethazine -dextromethorphan (PROMETHAZINE -DM) 6.25-15 MG/5ML syrup Take 5 mLs by mouth 4 (four) times daily as needed. 100 mL Stuart Vernell Norris, [place]      PDMP not reviewed this encounter.    [1]  Social History Tobacco Use   Smoking status: Never   Smokeless tobacco: Never  Vaping Use   Vaping status: Never Used  Substance Use Topics   Alcohol use: No   Drug use: No     Stuart Vernell Norris, PA-C 01/16/24 1045  "

## 2024-01-16 NOTE — ED Triage Notes (Signed)
 Pt reports congestion, cough, sore throat, headache ear pain  x1 week

## 2024-01-16 NOTE — Discharge Instructions (Signed)
 In addition to the prescribed medications, use Flonase  and/or Astelin nasal spray twice daily, over-the-counter cold and congestion medications as needed, antihistamines.  Continue the albuterol  every 4 hours as needed.  Return for worsening or unresolving symptoms.

## 2024-02-02 ENCOUNTER — Ambulatory Visit: Payer: 59 | Admitting: Allergy

## 2024-02-02 ENCOUNTER — Other Ambulatory Visit: Payer: Self-pay

## 2024-02-02 ENCOUNTER — Encounter: Payer: Self-pay | Admitting: Allergy

## 2024-02-02 VITALS — BP 110/80 | HR 80 | Temp 98.2°F | Resp 18 | Ht 65.0 in | Wt 132.0 lb

## 2024-02-02 DIAGNOSIS — J4520 Mild intermittent asthma, uncomplicated: Secondary | ICD-10-CM | POA: Diagnosis not present

## 2024-02-02 DIAGNOSIS — R12 Heartburn: Secondary | ICD-10-CM

## 2024-02-02 DIAGNOSIS — J3089 Other allergic rhinitis: Secondary | ICD-10-CM | POA: Diagnosis not present

## 2024-02-02 DIAGNOSIS — T7819XD Other adverse food reactions, not elsewhere classified, subsequent encounter: Secondary | ICD-10-CM | POA: Diagnosis not present

## 2024-02-02 DIAGNOSIS — R21 Rash and other nonspecific skin eruption: Secondary | ICD-10-CM | POA: Diagnosis not present

## 2024-02-02 NOTE — Progress Notes (Signed)
 "  Follow Up Note  RE: Tonya Lee MRN: [id number] DOB: 04-07-1985 Date of Office Visit: 02/02/2024  Referring provider: Dettinger, Tonya LABOR, MD Primary care provider: Dettinger, Tonya LABOR, MD  Chief Complaint: follow up  History of Present Illness: I had the pleasure of seeing Tonya Lee for a follow up visit at the [facility] on 02/02/2024. She is a 39 y.o. female, who is being followed for adverse food reaction, allergic rhinitis and heartburn. Her previous allergy  office visit was on 02/03/2023 with Dr. Luke. Today is a regular follow up visit.  Discussed the use of AI scribe software for clinical note transcription with the patient, who gave verbal consent to proceed.    She has experienced two episodes of rash in the past six months with unknown triggers. During these episodes, she developed a rash on her neck that spread to her chest, accompanied by severe pruritus. Benadryl  resolved the symptoms after a few hours. These episodes occurred after dining out with someone who ordered red meat, although she did not consume any herself.  She has been avoiding red meat and has had no issues apart from the two episodes mentioned. No recent tick bites and no identified triggers for her reactions. She continues to consume dairy without any problems.  Her past medical history includes exercise-induced asthma for which she keeps albuterol  on hand, though she rarely uses it. She also takes famotidine  daily for heartburn and continues to use Allegra for allergies.  She recently had a course of steroids due to fluid in her ears following an illness over Christmas after her children were sick.   Assessment and Plan: Tonya Lee is a 39 y.o. female with: Other adverse food reactions, not elsewhere classified, subsequent encounter Past history - 2023 skin testing borderline positive to green pea and garlic. 2024 bloodwork negative to alpha gal. Interim history -  2025 labs  borderline positive. No issues with dairy. Avoiding mammalian meat. Broke out twice with unknown trigger.  Continue to avoid mammalian meat.  Keep track of episodes. Avoid tick bites.  If interested we can schedule food challenge to hamburger.    Perennial allergic rhinitis Past history - 2023 bloodwork positive to cat and dog. Interim history - controlled with allegra. Continue environmental control measures. Use over the counter antihistamines such as Zyrtec  (cetirizine ), Claritin  (loratadine ), Allegra (fexofenadine ), or Xyzal (levocetirizine) daily as needed. May take twice a day during allergy  flares. May switch antihistamines every few months.  Mild intermittent asthma without complication No recent inhaler use.  Normal spirometry today. May use albuterol  rescue inhaler 2 puffs every 4 to 6 hours as needed for shortness of breath, chest tightness, coughing, and wheezing. May use albuterol  rescue inhaler 2 puffs 5 to 15 minutes prior to strenuous physical activities. Monitor frequency of use - if you need to use it more than twice per week on a consistent basis let us  know.   Rash and other nonspecific skin eruption 2 episodes with no triggers. Keep track of rashes and take pictures. Write down what you had done/eaten during flares.  See below for proper skin care. Use fragrance free and dye free products. No dryer sheets or fabric softener.     Heartburn Stable.  Continue famotidine  20mg  once a day.  Return for Food challenge.  No orders of the defined types were placed in this encounter.  Lab Orders  No laboratory test(s) ordered today    Diagnostics: Spirometry:  Tracings reviewed. Her effort: Good  reproducible efforts. FVC: 4.22L FEV1: 3.31L, 104% predicted FEV1/FVC ratio: 78% Interpretation: Spirometry consistent with normal pattern.  Please see scanned spirometry results for details.  Results discussed with patient/family.   Medication List:  Current  Outpatient Medications  Medication Sig Dispense Refill   albuterol  (VENTOLIN  HFA) 108 (90 Base) MCG/ACT inhaler Inhale 2 puffs into the lungs every 4 (four) hours as needed. 18 g 0   EPINEPHrine  (EPIPEN  2-PAK) 0.3 mg/0.3 mL IJ SOAJ injection Inject 0.3mg  into the muscle once for anaphylaxis 2 each 2   famotidine  (PEPCID ) 20 MG tablet Take 1 tablet by mouth 2 (two) times daily.     fexofenadine  (ALLEGRA) 180 MG tablet Take 180 mg by mouth daily.     levonorgestrel (MIRENA) 20 MCG/DAY IUD 1 each by Intrauterine route once.     Multiple Vitamin (MULTIVITAMIN ADULT PO) Multivitamin     Vibegron (GEMTESA) 75 MG TABS Take 1 tablet by mouth daily.     No current facility-administered medications for this visit.   Allergies: Allergies[1] I reviewed her past medical history, social history, family history, and environmental history and no significant changes have been reported from her previous visit.  Review of Systems  Constitutional:  Negative for appetite change, chills, fever and unexpected weight change.  HENT:  Negative for congestion and rhinorrhea.   Eyes:  Negative for itching.  Respiratory:  Negative for cough, chest tightness, shortness of breath and wheezing.   Cardiovascular:  Negative for chest pain.  Gastrointestinal:  Negative for abdominal pain.  Genitourinary:  Negative for difficulty urinating.  Skin:  Negative for rash.  Allergic/Immunologic: Positive for environmental allergies.  Neurological:  Negative for headaches.    Objective: BP 110/80 (BP Location: Left Arm, Patient Position: Sitting, Cuff Size: Normal)   Pulse 80   Temp 98.2 F (36.8 C) (Temporal)   Resp 18   Ht 5' 5 (1.651 m)   Wt 132 lb (59.9 kg)   SpO2 98%   BMI 21.97 kg/m  Body mass index is 21.97 kg/m. Physical Exam Vitals and nursing note reviewed.  Constitutional:      Appearance: Normal appearance. She is well-developed.  HENT:     Head: Normocephalic and atraumatic.     Right Ear:  Tympanic membrane and external ear normal.     Left Ear: Tympanic membrane and external ear normal.     Nose: Nose normal.     Mouth/Throat:     Mouth: Mucous membranes are moist.     Pharynx: Oropharynx is clear.  Eyes:     Conjunctiva/sclera: Conjunctivae normal.  Cardiovascular:     Rate and Rhythm: Normal rate and regular rhythm.     Heart sounds: Normal heart sounds. No murmur heard.    No friction rub. No gallop.  Pulmonary:     Effort: Pulmonary effort is normal.     Breath sounds: Normal breath sounds. No wheezing, rhonchi or rales.  Musculoskeletal:     Cervical back: Neck supple.  Skin:    General: Skin is warm.     Findings: No rash.  Neurological:     Mental Status: She is alert and oriented to person, place, and time.  Psychiatric:        Behavior: Behavior normal.    Previous notes and tests were reviewed. The plan was reviewed with the patient/family, and all questions/concerned were addressed.  It was my pleasure to see Tonya Lee today and participate in her care. Please feel free to contact me with any questions  or concerns.  Sincerely,  Orlan Cramp, DO Allergy  & Immunology  [facility]  [facility] office: [phone] [facility] office: [phone]     [1]  Allergies Allergen Reactions   Acyclovir Hives   Sulfa Antibiotics Anaphylaxis, Hives and Other (See Comments)    Throat and face swelled   Flomax  [Tamsulosin ]     Unknown   Pork Allergy      GI symptoms   "

## 2024-02-02 NOTE — Patient Instructions (Addendum)
 Rash Keep track of rashes and take pictures. Write down what you had done/eaten during flares.  See below for proper skin care. Use fragrance free and dye free products. No dryer sheets or fabric softener.    Food  Continue to avoid mammalian meat.  Keep track of episodes. Avoid tick bites.  If interested we can schedule food challenge to hamburger.  Food challenge instructions: You must be off antihistamines for 3-5 days before. Must be in good health and not ill. No vaccines/injections/antibiotics within the past 7 days.  Plan on being in the office for 2-3 hours and must bring in the food you want to do the oral challenge for.  You must call to schedule an appointment and specify it's for a food challenge.   Environmental allergies 2023 allergy  testing positive to cat and dog.  Continue environmental control measures. Use over the counter antihistamines such as Zyrtec  (cetirizine ), Claritin  (loratadine ), Allegra (fexofenadine ), or Xyzal (levocetirizine) daily as needed. May take twice a day during allergy  flares. May switch antihistamines every few months.  Heartburn Continue famotidine  20mg  once a day.  Exercise induced asthma Normal breathing test today. May use albuterol  rescue inhaler 2 puffs every 4 to 6 hours as needed for shortness of breath, chest tightness, coughing, and wheezing. May use albuterol  rescue inhaler 2 puffs 5 to 15 minutes prior to strenuous physical activities. Monitor frequency of use - if you need to use it more than twice per week on a consistent basis let us  know.   Follow up for food challenge. Regular check up in 12 months.   Skin care recommendations  Bath time: Always use lukewarm water. AVOID very hot or cold water. Keep bathing time to 5-10 minutes. Do NOT use bubble bath. Use a mild soap and use just enough to wash the dirty areas. Do NOT scrub skin vigorously.  After bathing, pat dry your skin with a towel. Do NOT rub or scrub the  skin.  Moisturizers and prescriptions:  ALWAYS apply moisturizers immediately after bathing (within 3 minutes). This helps to lock-in moisture. Use the moisturizer several times a day over the whole body. Good summer moisturizers include: Aveeno, CeraVe, Cetaphil. Good winter moisturizers include: Aquaphor, Vaseline, Cerave, Cetaphil, Eucerin, Vanicream. When using moisturizers along with medications, the moisturizer should be applied about one hour after applying the medication to prevent diluting effect of the medication or moisturize around where you applied the medications. When not using medications, the moisturizer can be continued twice daily as maintenance.  Laundry and clothing: Avoid laundry products with added color or perfumes. Use unscented hypo-allergenic laundry products such as Tide free, Cheer free & gentle, and All free and clear.  If the skin still seems dry or sensitive, you can try double-rinsing the clothes. Avoid tight or scratchy clothing such as wool. Do not use fabric softeners or dyer sheets.
